# Patient Record
Sex: Male | Born: 1960 | Race: White | Hispanic: No | Marital: Married
Health system: Midwestern US, Community
[De-identification: ages and names within clinical notes are randomized; demographics above are authoritative.]

## PROBLEM LIST (undated history)

## (undated) DIAGNOSIS — E291 Testicular hypofunction: Secondary | ICD-10-CM

## (undated) DIAGNOSIS — L4050 Arthropathic psoriasis, unspecified: Secondary | ICD-10-CM

## (undated) DIAGNOSIS — E782 Mixed hyperlipidemia: Secondary | ICD-10-CM

## (undated) DIAGNOSIS — Z0000 Encounter for general adult medical examination without abnormal findings: Secondary | ICD-10-CM

## (undated) DIAGNOSIS — L409 Psoriasis, unspecified: Secondary | ICD-10-CM

## (undated) DIAGNOSIS — E7800 Pure hypercholesterolemia, unspecified: Secondary | ICD-10-CM

## (undated) DIAGNOSIS — K219 Gastro-esophageal reflux disease without esophagitis: Secondary | ICD-10-CM

## (undated) DIAGNOSIS — K5790 Diverticulosis of intestine, part unspecified, without perforation or abscess without bleeding: Secondary | ICD-10-CM

## (undated) HISTORY — PX: TOOTH EXTRACTION: SUR596

## (undated) HISTORY — PX: KNEE ARTHROSCOPY: SUR90

## (undated) HISTORY — DX: Psoriasis, unspecified: L40.9

## (undated) HISTORY — DX: Diverticulosis of intestine, part unspecified, without perforation or abscess without bleeding: K57.90

## (undated) HISTORY — DX: Pure hypercholesterolemia, unspecified: E78.00

## (undated) HISTORY — PX: OTHER SURGICAL HISTORY: SHX169

## (undated) HISTORY — DX: Gastro-esophageal reflux disease without esophagitis: K21.9

## (undated) HISTORY — PX: COLONOSCOPY: SHX174

## (undated) HISTORY — PX: TONSILLECTOMY: SUR1361

## (undated) HISTORY — DX: Arthropathic psoriasis, unspecified: L40.50

## (undated) HISTORY — PX: WRIST SURGERY: SHX841

---

## 2004-07-07 HISTORY — PX: LASIK: SHX215

## 2009-02-13 ENCOUNTER — Ambulatory Visit

## 2009-02-13 LAB — AMB POC URINALYSIS DIP STICK AUTO W/O MICRO
Bilirubin (UA POC): NEGATIVE
Blood (UA POC): NEGATIVE
Creatinine: NEGATIVE mg/dL
Glucose (UA POC): NEGATIVE
Ketones (UA POC): NEGATIVE
Leukocyte esterase (UA POC): NEGATIVE
Nitrites (UA POC): NEGATIVE
Protein (UA POC): NEGATIVE mg/dL
Protein-Creat Ratio: NEGATIVE
Specific gravity (UA POC): 1.025 (ref 1.001–1.035)
pH (UA POC): 5 (ref 4.6–8.0)

## 2009-02-13 LAB — PSA, DIAGNOSTIC (PROSTATE SPECIFIC AG): Prostate Specific Ag: 0.9 ng/mL (ref 0.0–4.0)

## 2009-02-13 LAB — CBC WITH AUTOMATED DIFF
ABS. BASOPHILS: 0 10*3/uL (ref 0.0–0.1)
ABS. EOSINOPHILS: 0.1 10*3/uL (ref 0.0–0.4)
ABS. LYMPHOCYTES: 1.9 10*3/uL (ref 0.8–3.5)
ABS. MONOCYTES: 0.5 10*3/uL (ref 0.0–1.0)
ABS. NEUTROPHILS: 2.4 10*3/uL (ref 1.8–8.0)
BASOPHILS: 0 % (ref 0–1)
EOSINOPHILS: 3 % (ref 0–7)
HCT: 46.9 % (ref 36.6–50.3)
HGB: 15.2 g/dL (ref 12.1–17.0)
LYMPHOCYTES: 38 % (ref 12–49)
MCH: 29.3 PG (ref 26.0–34.0)
MCHC: 32.4 g/dL (ref 30.0–36.5)
MCV: 90.4 FL — ABNORMAL HIGH (ref 80.0–90.0)
MONOCYTES: 10 % (ref 5–13)
NEUTROPHILS: 49 % (ref 32–75)
PLATELET: 301 10*3/uL (ref 150–400)
RBC: 5.19 M/uL (ref 4.10–5.70)
RDW: 13.9 % (ref 11.5–14.5)
WBC: 4.9 10*3/uL (ref 4.1–11.1)

## 2009-02-13 LAB — METABOLIC PANEL, COMPREHENSIVE
A-G Ratio: 1.4 (ref 1.1–2.2)
ALT (SGPT): 38 U/L (ref 12–78)
AST (SGOT): 25 U/L (ref 15–37)
Albumin: 4.2 g/dL (ref 3.5–5.0)
Alk. phosphatase: 38 U/L — ABNORMAL LOW (ref 50–136)
Anion gap: 11 mmol/L (ref 5–15)
BUN/Creatinine ratio: 13 (ref 12–20)
BUN: 13 MG/DL (ref 6–20)
Bilirubin, total: 0.6 MG/DL (ref 0.2–1.0)
CO2: 29 MMOL/L (ref 21–32)
Calcium: 9.4 MG/DL (ref 8.5–10.1)
Chloride: 102 MMOL/L (ref 97–108)
Creatinine: 1 MG/DL (ref 0.6–1.3)
GFR est AA: >60 mL/min/{1.73_m2} (ref >60)
GFR est non-AA: >60 mL/min/{1.73_m2} (ref >60)
Globulin: 2.9 g/dL (ref 2.0–4.0)
Glucose: 88 MG/DL (ref 65–100)
Potassium: 4.4 MMOL/L (ref 3.5–5.1)
Protein, total: 7.1 g/dL (ref 6.4–8.2)
Sodium: 142 MMOL/L (ref 136–145)

## 2009-02-13 LAB — AMB POC FECAL BLOOD, OCCULT, QL 3 CARDS

## 2009-02-13 LAB — LIPID PANEL
CHOL/HDL Ratio: 4.5 (ref 0–5.0)
Cholesterol, total: 232 MG/DL — ABNORMAL HIGH (ref <200)
HDL Cholesterol: 51 MG/DL
LDL, calculated: 131.4 MG/DL — ABNORMAL HIGH (ref 0–100)
Triglyceride: 248 MG/DL — ABNORMAL HIGH (ref <150)
VLDL, calculated: 49.6 MG/DL

## 2009-02-13 NOTE — Progress Notes (Signed)
Cpe with non fasting labs check cholesterol, dm    Results for orders placed in visit on 02/13/09   AMB POC URINALYSIS DIP STICK AUTO W/O MICRO   Component Value Range   ??? Color Light Yellow     ??? Clarity Clear     ??? pH 5.0  4.6 - 8.0    ??? Spec.Grav. 1.025  1.001 - 1.035    ??? Glucose Negative   > Negative    ??? Ketones Negative   > Negative    ??? Bilirubin Negative   > Negative    ??? Blood Negative   > Negative    ??? Nitrites Negative   > Negative    ??? Leukocyte esterase Negative   > Negative    ??? Protein negative   > Negative (mg/dL)   ??? Creatinine negative  (mg/dL)   ??? Protein-Creat Ratio negative         Wants to talk to you about colonoscopy    Subjective: (As above and below)   Chief Complaint   Patient presents with   ??? Complete Physical   ??? Labs   ??? Cholesterol Problem   ??? Diabetes       he is a 48 y.o. year old male who presents for evalution.    Reviewed and agree with Nurse Note duplicated in this note.  Reviewed PmHx, RxHx, FmHx, SocHx, AllgHx:  yes    Review of Systems - negative except as listed above    Objective:   Filed Vitals:    02/13/2009  9:11 AM   BP: 150/77   Pulse: 85   Height: 5' 10.87" (1.8 m)   Weight: 208 lb (94.348 kg)         Physical Examination: General appearance - alert, well appearing, and in no distress  Eyes - pupils equal and reactive, extraocular eye movements intact  Ears - bilateral TM's and external ear canals normal  Nose - normal and patent, no erythema, discharge or polyps  Mouth - mucous membranes moist, pharynx normal without lesions  Neck - supple, no significant adenopathy  Chest - clear to auscultation, no wheezes, rales or rhonchi, symmetric air entry  Heart - normal rate, regular rhythm, normal S1, S2, no murmurs, rubs, clicks or gallops  Abdomen - soft, nontender, nondistended, no masses or organomegaly  GU Male - no penile lesions or discharge, no testicular masses or tenderness, no hernias,prostate nl, heme neg   Musculoskeletal - no joint tenderness, deformity or swelling  Extremities - peripheral pulses normal, no pedal edema, no clubbing or cyanosis  Skin - normal coloration and turgor, no rashes, no suspicious skin lesions noted    Assessment/ Plan:   Jakayden was seen today for complete physical, labs, cholesterol problem and diabetes.    Diagnoses and associated orders for this visit:    Routine general medical examination at a health care facility  - AMB POC URINALYSIS DIP STICK AUTO W/O MICRO  - LIPID PANEL; Future  - CBC WITH AUTOMATED DIFF; Future  - METABOLIC PANEL, COMPREHENSIVE; Future  - PROSTATE SPECIFIC AG; Future  - REFERRAL TO GASTROENTEROLOGY  - HIV 2 AB; Future  - AMB POC FECAL BLOOD OCCULT  -DWP CA risk and scope need at age 8       I have discussed the diagnosis with the patient and the intended plan as seen in the above orders.  The patient has received an after-visit summary and questions were answered concerning future plans.  Medication Side Effects and Warnings were discussed with patient: yes  Patient Labs were reviewed: yes  Patient Past Records were reviewed:  yes    Follow-up Disposition:  Return if symptoms worsen or fail to improve.    Danley Danker, M.D.

## 2009-02-13 NOTE — Progress Notes (Signed)
Cpe with non fasting labs check cholesterol, dm    Results for orders placed in visit on 02/13/09   AMB POC URINALYSIS DIP STICK AUTO W/O MICRO   Component Value Range   ??? Color Light Yellow     ??? Clarity Clear     ??? pH 5.0  4.6 - 8.0    ??? Spec.Grav. 1.025  1.001 - 1.035    ??? Glucose Negative   > Negative    ??? Ketones Negative   > Negative    ??? Bilirubin Negative   > Negative    ??? Blood Negative   > Negative    ??? Nitrites Negative   > Negative    ??? Leukocyte esterase Negative   > Negative    ??? Protein negative   > Negative (mg/dL)   ??? Creatinine negative  (mg/dL)   ??? Protein-Creat Ratio negative           Wants to talk to you about colonoscopy

## 2009-02-15 LAB — HIV 2 AB W/REFL IB
HIV 2 ANTIBODY,HIV2: NEGATIVE
HIV-2 Ab screen: NEGATIVE

## 2009-02-15 NOTE — Progress Notes (Signed)
Quick Note:    [phone]  Call this number and no one answered will try again Monday  ______

## 2009-02-15 NOTE — Progress Notes (Signed)
Quick Note:    All labs good  ______

## 2009-02-19 NOTE — Progress Notes (Signed)
Called patient again and no answer no voice mail

## 2009-02-28 NOTE — Progress Notes (Signed)
Quick Note:    [phone] (home)   Called patient again and no answer no answering machine  ______

## 2009-11-20 MED ORDER — LORATADINE 10 MG TAB
10 mg | ORAL_TABLET | Freq: Every day | ORAL | Status: AC
Start: 2009-11-20 — End: 2010-11-15

## 2009-11-20 MED ORDER — LORATADINE 10 MG TAB
10 mg | ORAL_TABLET | Freq: Every day | ORAL | Status: DC
Start: 2009-11-20 — End: 2009-11-20

## 2009-11-20 NOTE — Progress Notes (Signed)
C/o ringing in his both ears, dizzy at times for 2 months off and on. No OTC medication    Pain Assessment Encounter      TREY GULBRANSON  11/20/2009  Onset of Symptoms: right shoulder off and on for 2 months  ________________________________________________________________________  Description:achy    Frequency: 2-3 times a day  Pain Scale:(1-10): 1 right now but moving around 3-4  Trauma Hx: fall not really sure  Hx of similar symptoms: Yes  Radiation: no  Duration:  continuous      Progression: much better  What makes it better?:   What makes it worse?:moving  Medications tried: tylenol  ________________________________________________________________________  ________________________________________________________________________

## 2009-11-20 NOTE — Progress Notes (Addendum)
Addended by: Quintin Alto on: 11/20/2009      Modules accepted: Orders

## 2009-11-20 NOTE — Progress Notes (Signed)
C/o ringing in his both ears, dizzy at times for 2 months off and on. No OTC medication    Pain Assessment Encounter      Albert Baker  11/20/2009  Onset of Symptoms: right shoulder off and on for 2 months  ________________________________________________________________________  Description:achy    Frequency: 2-3 times a day  Pain Scale:(1-10): 1 right now but moving around 3-4  Trauma Hx: fall not really sure  Hx of similar symptoms: Yes  Radiation: no  Duration:  continuous      Progression: much better  What makes it better?:   What makes it worse?:moving  Medications tried: tylenol  ________________________________________________________________________  ________________________________________________________________________     Has had in allgs    Chief Complaint   Patient presents with   ??? Ringing in Ear   ??? Shoulder Injury       he is a 49 y.o. year old male who presents for evalution.    Reviewed and agree with Nurse Note and duplicated in this note.  Reviewed PmHx, RxHx, FmHx, SocHx, AllgHx and updated and dated in the chart.    Review of Systems - negative except as listed above    Objective:   Filed Vitals:    11/20/09 0808   BP: 107/59   Pulse: 71   Height: 5' 10.8" (1.798 m)   Weight: 210 lb (95.255 kg)       Physical Examination: General appearance - alert, well appearing, and in no distress  Eyes - pupils equal and reactive, extraocular eye movements intact  Ears - dec LR bilat TMs  Nose - normal and patent, no erythema, discharge or polyps  Neck - supple, no significant adenopathy  Chest - clear to auscultation, no wheezes, rales or rhonchi, symmetric air entry  Heart - normal rate, regular rhythm, normal S1, S2, no murmurs, rubs, clicks or gallops  Musculoskeletal - no joint tenderness, deformity or swelling    Assessment/ Plan:   Albert Baker was seen today for ringing in ear and shoulder injury.    Diagnoses and associated orders for this visit:    Ringing in ear   - loratadine (CLARITIN) 10 mg tablet; Take 1 Tab by mouth daily for 360 days.  -ETD    Shoulder pain  -mild strain  -observe     Follow-up Disposition:  Return if symptoms worsen or fail to improve.    I have discussed the diagnosis with the patient and the intended plan as seen in the above orders.  The patient has received an after-visit summary and questions were answered concerning future plans.     Medication Side Effects and Warnings were discussed with patient: yes  Patient Labs were reviewed and or requested: no  Patient Past Records were reviewed and or requested  yes    Danley Danker, M.D.

## 2010-06-13 MED ORDER — DICLOFENAC 75 MG TAB, DELAYED RELEASE
75 mg | ORAL_TABLET | Freq: Two times a day (BID) | ORAL | Status: AC
Start: 2010-06-13 — End: 2010-06-27

## 2010-06-13 NOTE — Progress Notes (Signed)
Pt here for annual physical  Fasting labs    experiencing low back pain on the right side  Pt said "feels like a burning sensation in the joints."  Pain radiates down from to the feet at times of numbness and tingling in the foot  Pain increases with activity  Pain scale 0  Been going on the last couple months  OTC - Advil    Radiates to R leg    Chief Complaint   Patient presents with   ??? Physical   ??? Labs     fasting labs   ??? Back Pain     lower back pain       he is a 49 y.o. year old male who presents for evalution.    Reviewed and agree with Nurse Note and duplicated in this note.  Reviewed PmHx, RxHx, FmHx, SocHx, AllgHx and updated and dated in the chart.    Review of Systems - negative except as listed above    Objective:   Filed Vitals:    06/13/10 0815   BP: 124/77   Pulse: 74   Temp: 98 ??F (36.7 ??C)   TempSrc: Oral   Height: 5\' 10"  (1.778 m)   Weight: 215 lb 12.8 oz (97.886 kg)       Physical Examination: General appearance - alert, well appearing, and in no distress  Eyes - pupils equal and reactive, extraocular eye movements intact  Ears - bilateral TM's and external ear canals normal  Nose - normal and patent, no erythema, discharge or polyps  Mouth - mucous membranes moist, pharynx normal without lesions  Neck - supple, no significant adenopathy  Chest - clear to auscultation, no wheezes, rales or rhonchi, symmetric air entry  Heart - normal rate, regular rhythm, normal S1, S2, no murmurs, rubs, clicks or gallops  Abdomen - soft, nontender, nondistended, no masses or organomegaly  Rectal - declined by patient  Back exam - full range of motion, no tenderness, palpable spasm or pain on motion  Extremities - peripheral pulses normal, no pedal edema, no clubbing or cyanosis    Assessment/ Plan:   Albert Baker was seen today for physical, labs and back pain.    Diagnoses and associated orders for this visit:    Routine general medical examination at a health care facility   - etanercept (ENBREL) 50 mg/mL (0.98 mL) injection; by SubCUTAneous route every seven (7) days.  - LIPID PANEL  - PROSTATE SPECIFIC AG  - METABOLIC PANEL, COMPREHENSIVE  - CBC WITH AUTOMATED DIFF  - TSH, 3RD GENERATION  - TESTOSTERONE,FREE+WEAKLY BOUND    Lbp (low back pain)  - diclofenac EC (VOLTAREN) 75 mg EC tablet; Take 1 Tab by mouth two (2) times a day for 14 days.  -mild sciatica         -Patient is in good health  -Discussed with patient cancer risk factors and screens needed  -Patient needs a colonoscopy no  -Labs from previous visits were discussed with patient yes  -Discussed with patient diet and exercise yes  -Discussed with patient testicular self exam no  Follow-up Disposition:  Return if symptoms worsen or fail to improve.    I have discussed the diagnosis with the patient and the intended plan as seen in the above orders.  The patient has received an after-visit summary and questions were answered concerning future plans.     Medication Side Effects and Warnings were discussed with patient: yes  Patient Labs were reviewed and or  requested: yes  Patient Past Records were reviewed and or requested  yes    Danley Danker, M.D.

## 2010-06-13 NOTE — Telephone Encounter (Signed)
Still has same side effect

## 2010-06-13 NOTE — Patient Instructions (Signed)
Well Visit???Men 51 to 65: After Your Visit  Your Care Instructions  Physical exams can help you stay healthy. Your doctor has checked your overall health and may have suggested ways to take good care of yourself. He or she also may have recommended tests. At home, you can help prevent illness with healthy eating, regular exercise, and other steps.  Follow-up care is a key part of your treatment and safety. Be sure to make and go to all appointments, and call your doctor if you are having problems. It???s also a good idea to know your test results and keep a list of the medicines you take.  How can you care for yourself at home?  ?? Reach and stay at a healthy weight. This will lower your risk for many problems, such as obesity, diabetes, heart disease, and high blood pressure.   ?? Get at least 30 minutes of exercise on most days of the week. Walking is a good choice. You also may want to do other activities, such as running, swimming, cycling, or playing tennis or team sports.   ?? Do not smoke. Smoking can make health problems worse. If you need help quitting, talk to your doctor about stop-smoking programs and medicines. These can increase your chances of quitting for good.   ?? Always wear sunscreen on exposed skin. Make sure the sunscreen blocks ultraviolet rays (both UVA and UVB) and has a sun protection factor (SPF) of at least 15. Use it every day, even when it is cloudy. Some doctors may recommend a higher SPF, such as 30.   ?? See a dentist one or two times a year for checkups and to have your teeth cleaned.   ?? Wear a seat belt in the car.   ?? Limit alcohol to 2 drinks a day. Too much alcohol can cause health problems.  Follow your doctor's advice about when to have certain tests. These tests can spot problems early.   ?? Cholesterol. Your doctor will tell you how often to have this done based on your overall health and other things that can increase your risk for heart disease. Usually, an adult who has coronary artery disease or diabetes should have cholesterol testing at least once a year. If you are being treated for high cholesterol, how often you have tests depends on your cholesterol level and the type of treatment.   ?? Blood pressure. Experts suggest that healthy adults with normal blood pressure (119/79 mm Hg or below) have their blood pressure checked at least every 1 to 2 years. This can be done during a routine doctor visit. If you have slightly higher or high blood pressure, or are at risk for heart disease, your doctor will suggest more frequent tests.   ?? Prostate exam. Talk to your doctor about whether and how often you should have a blood test (called a PSA test) for prostate cancer. Experts differ on how often men should have this test. They recommend that you discuss the benefits and risks of the test with your doctor.   ?? Diabetes. Ask your doctor whether you should have tests for diabetes.   ?? Vision. Some experts recommend that you have yearly exams for glaucoma and other age-related eye problems starting at age 51.   ?? Hearing. Tell your doctor if you notice any change in your hearing. You can have tests to find out how well you hear.   ?? Colon cancer. You should begin tests for colon cancer at age   51. You may have one of several tests. Your doctor will tell you how often to have tests based on your age and risk. Risks include whether you already had a precancerous polyp removed from your colon or whether your parent, brother, sister, or child has had colon cancer.    ?? Coronary artery disease. Every 5 years, you should have your risks for heart disease assessed. This test uses factors such as your age, blood pressure, cholesterol, and whether you smoke or have diabetes to show what your risk for a heart attack is over the next 10 years.   ?? Abdominal aortic aneurysm. Ask your doctor whether you should have a test to check for an aneurysm. You may need a test if you ever smoked or if your parent, brother, sister, or child has had an aneurysm.  When should you call for help?  Watch closely for changes in your health, and be sure to contact your doctor if you have any problems or symptoms that concern you.    Where can you learn more?   Go to http://www.healthwise.net/[facility]  Enter K916 in the search box to learn more about "Well Visit???Men 51 to 65: After Your Visit."   ?? 2006-2011 [employer]. Care instructions adapted under license by [facility] (which disclaims liability or warranty for this information). This care instruction is for use with your licensed healthcare professional. If you have questions about a medical condition or this instruction, always ask your healthcare professional. [employer], Inc[employer] disclaims any warranty or liability for your use of this information.  Content Version: 9.0.56994; Last Revised: February 28, 2008

## 2010-06-13 NOTE — Telephone Encounter (Signed)
Called pt advised taking 1/4 of tablet still has the same side effect.

## 2010-06-13 NOTE — Progress Notes (Signed)
Pt here for annual physical  Fasting labs    experiencing low back pain on the right side  Pt said "feels like a burning sensation in the joints."  Pain radiates down from to the feet at times of numbness and tingling in the foot  Pain increases with activity  Pain scale 0  Been going on the last couple months  OTC - Advil

## 2010-06-13 NOTE — Telephone Encounter (Signed)
Pt called wanting to know if the Finasteride 5 mg rx that was discussed on the office visit today, he stated that maybe he could take 1/4 of the pills instead of the whole thing, please call back at [phone]

## 2010-06-14 LAB — METABOLIC PANEL, COMPREHENSIVE
A-G Ratio: 2 (ref 1.1–2.5)
ALT (SGPT): 35 IU/L (ref 0–55)
AST (SGOT): 26 IU/L (ref 0–40)
Albumin: 4.6 g/dL (ref 3.5–5.5)
Alk. phosphatase: 32 IU/L (ref 25–150)
BUN/Creatinine ratio: 13 (ref 9–20)
BUN: 13 mg/dL (ref 6–24)
Bilirubin, total: 0.4 mg/dL (ref 0.0–1.2)
CO2: 25 mmol/L (ref 20–32)
Calcium: 9.1 mg/dL (ref 8.7–10.2)
Chloride: 103 mmol/L (ref 97–108)
Creatinine: 0.97 mg/dL (ref 0.76–1.27)
GFR est AA: 104 mL/min/{1.73_m2} (ref >59)
GFR est non-AA: 90 mL/min/{1.73_m2} (ref >59)
GLOBULIN, TOTAL: 2.3 g/dL (ref 1.5–4.5)
Glucose: 102 mg/dL — ABNORMAL HIGH (ref 65–99)
Potassium: 4.6 mmol/L (ref 3.5–5.2)
Protein, total: 6.9 g/dL (ref 6.0–8.5)
Sodium: 141 mmol/L (ref 135–145)

## 2010-06-14 LAB — CBC WITH AUTOMATED DIFF
ABS. BASOPHILS: 0 10*3/uL (ref 0.0–0.2)
ABS. EOSINOPHILS: 0.1 10*3/uL (ref 0.0–0.4)
ABS. IMM. GRANS.: 0 10*3/uL (ref 0.0–0.1)
ABS. LYMPHOCYTES: 1.6 10*3/uL (ref 0.7–4.5)
ABS. MONOCYTES: 0.6 10*3/uL (ref 0.1–1.0)
ABS. NEUTROPHILS: 2.2 10*3/uL (ref 1.8–7.8)
BASOPHILS: 0 % (ref 0–3)
EOSINOPHILS: 3 % (ref 0–7)
HCT: 45.8 % (ref 36.0–50.0)
HGB: 15.1 g/dL (ref 12.5–17.0)
IMMATURE GRANULOCYTES: 0 % (ref 0–2)
LYMPHOCYTES: 35 % (ref 14–46)
MCH: 29.5 pg (ref 27.0–34.0)
MCHC: 33 g/dL (ref 32.0–36.0)
MCV: 90 fL (ref 80–98)
MONOCYTES: 12 % (ref 4–13)
NEUTROPHILS: 50 % (ref 40–74)
PLATELET: 291 10*3/uL (ref 140–415)
RBC: 5.11 x10E6/uL (ref 4.10–5.60)
RDW: 13.9 % (ref 11.7–15.0)
WBC: 4.5 10*3/uL (ref 4.0–10.5)

## 2010-06-14 LAB — LIPID PANEL
Cholesterol, total: 260 mg/dL — ABNORMAL HIGH (ref 100–199)
HDL Cholesterol: 52 mg/dL (ref >39)
LDL, calculated: 183 mg/dL — ABNORMAL HIGH (ref 0–99)
Triglyceride: 126 mg/dL (ref 0–149)
VLDL, calculated: 25 mg/dL (ref 5–40)

## 2010-06-14 LAB — TESTOSTERONE,FREE+WEAKLY BOUND
Testost, Free+Wk Bound %: 26.7 % (ref 9.0–46.0)
Testost, Free+Wk Bound: 54.7 ng/dL (ref 40.0–250.0)
Testosterone: 205 ng/dL — ABNORMAL LOW (ref 348–1197)

## 2010-06-14 LAB — PSA, DIAGNOSTIC (PROSTATE SPECIFIC AG): Prostate Specific Ag: 0.8 ng/mL (ref 0.0–4.0)

## 2010-06-14 LAB — TSH 3RD GENERATION: TSH: 1.25 u[IU]/mL (ref 0.450–4.500)

## 2010-06-19 MED ORDER — TESTOSTERONE ENANTHATE 200 MG/ML IM OIL
200 mg/mL | Freq: Once | INTRAMUSCULAR | Status: AC
Start: 2010-06-19 — End: 2010-06-19

## 2010-06-19 NOTE — Progress Notes (Signed)
Pt here for follow-up OV 12/8  Discuss labs results    Results for orders placed in visit on 06/13/10   LIPID PANEL   Component Value Range   ??? Cholesterol, total 260 (*) 100 - 199 (mg/dL)   ??? Triglyceride 126  0 - 149 (mg/dL)   ??? HDL Cholesterol 52  >39 (mg/dL)   ??? VLDL, calculated 25  5 - 40 (mg/dL)   ??? LDL, calculated 183 (*) 0 - 99 (mg/dL)   PROSTATE SPECIFIC AG (PSA)   Component Value Range   ??? Prostate Specific Ag 0.8  0.0 - 4.0 (ng/mL)   METABOLIC PANEL, COMPREHENSIVE   Component Value Range   ??? Glucose 102 (*) 65 - 99 (mg/dL)   ??? BUN 13  6 - 24 (mg/dL)   ??? Creatinine 0.97  0.76 - 1.27 (mg/dL)   ??? GFR est non-AA 90  >59 (mL/min/1.73)   ??? GFR est AA 104  >59 (mL/min/1.73)   ??? BUN/Creatinine ratio 13  9 - 20    ??? Sodium 141  135 - 145 (mmol/L)   ??? Potassium 4.6  3.5 - 5.2 (mmol/L)   ??? Chloride 103  97 - 108 (mmol/L)   ??? CO2 25  20 - 32 (mmol/L)   ??? Calcium 9.1  8.7 - 10.2 (mg/dL)   ??? Protein, total 6.9  6.0 - 8.5 (g/dL)   ??? Albumin 4.6  3.5 - 5.5 (g/dL)   ??? GLOBULIN, TOTAL 2.3  1.5 - 4.5 (g/dL)   ??? A-G Ratio 2.0  1.1 - 2.5    ??? Bilirubin, total 0.4  0.0 - 1.2 (mg/dL)   ??? Alk. phosphatase 32  25 - 150 (IU/L)   ??? AST 26  0 - 40 (IU/L)   ??? ALT 35  0 - 55 (IU/L)   CBC WITH AUTOMATED DIFF   Component Value Range   ??? WBC 4.5  4.0 - 10.5 (x10E3/uL)   ??? RBC 5.11  4.10 - 5.60 (x10E6/uL)   ??? HGB 15.1  12.5 - 17.0 (g/dL)   ??? HCT 45.8  36.0 - 50.0 (%)   ??? MCV 90  80 - 98 (fL)   ??? MCH 29.5  27.0 - 34.0 (pg)   ??? MCHC 33.0  32.0 - 36.0 (g/dL)   ??? RDW 13.9  11.7 - 15.0 (%)   ??? PLATELET 291  140 - 415 (x10E3/uL)   ??? NEUTROPHILS 50  40 - 74 (%)   ??? LYMPHOCYTES 35  14 - 46 (%)   ??? MONOCYTES 12  4 - 13 (%)   ??? EOSINOPHILS 3  0 - 7 (%)   ??? BASOPHILS 0  0 - 3 (%)   ??? Immature cells CANCELED     ??? ABSOLUTE NEUTS 2.2  1.8 - 7.8 (x10E3/uL)   ??? ABSOLUTE LYMPHS 1.6  0.7 - 4.5 (x10E3/uL)   ??? ABSOLUTE MONOS 0.6  0.1 - 1.0 (x10E3/uL)   ??? ABSOLUTE EOSINS 0.1  0.0 - 0.4 (x10E3/uL)   ??? ABSOLUTE BASOS 0.0  0.0 - 0.2 (x10E3/uL)    ??? IMM. GRANS. 0  0 - 2 (%)   ??? ABS. IMM. GRANS. 0.0  0.0 - 0.1 (x10E3/uL)   ??? NRBC CANCELED     ??? Hematology Comments: CANCELED     TSH, 3RD GENERATION   Component Value Range   ??? TSH, 3rd generation 1.250  0.450 - 4.500 (uIU/mL)   TESTOSTERONE,FREE+WEAKLY BOUND   Component Value Range   ??? Testosterone 205 (*)  348 - 1197 (ng/dL)   ??? Testost., % Free+Weakly Bound 26.7  9.0 - 46.0 (%)   ??? Testost., F+W Bound 54.7  40.0 - 250.0 (ng/dL)       Chief Complaint   Patient presents with   ??? Follow-up     OV 12/8   ??? Abnormal Lab Results       he is a 49 y.o. year old male who presents for evalution.    Reviewed and agree with Nurse Note and duplicated in this note.  Reviewed PmHx, RxHx, FmHx, SocHx, AllgHx and updated and dated in the chart.    Review of Systems - negative except as listed above    Objective:   Filed Vitals:    06/19/10 1404   BP: 146/77   Pulse: 75   Temp: 98.3 ??F (36.8 ??C)   TempSrc: Oral   Height: 5\' 10"  (1.778 m)   Weight: 217 lb 9.6 oz (98.703 kg)       Physical Examination: General appearance - alert, well appearing, and in no distress  Neck - supple, no significant adenopathy  Chest - clear to auscultation, no wheezes, rales or rhonchi, symmetric air entry  Heart - normal rate, regular rhythm, normal S1, S2, no murmurs, rubs, clicks or gallops    Assessment/ Plan:   Albert Baker was seen today for follow-up and abnormal lab results.    Diagnoses and associated orders for this visit:    Hypogonadism male  - THER/PROPH/DIAG INJECTION, SUBCUT/IM  - testosterone enanthate (DELATESTRYL) 200 mg/mL injection; 2 mL by IntraMUSCular route once for 1 dose.  - TESTOSTERONE ENANTHATE INJ  -DWp trt options and gel at the next ov         Follow-up Disposition:  Return in about 1 month (around 07/20/2010) for testosterone.    I have discussed the diagnosis with the patient and the intended plan as seen in the above orders.  The patient has received an after-visit summary and questions were answered concerning future plans.      Medication Side Effects and Warnings were discussed with patient: yes  Patient Labs were reviewed and or requested: yes  Patient Past Records were reviewed and or requested  yes    Danley Danker, M.D.

## 2010-06-19 NOTE — Progress Notes (Signed)
Pt here for follow-up OV 12/8  Discuss labs results    Results for orders placed in visit on 06/13/10   LIPID PANEL   Component Value Range   ??? Cholesterol, total 260 (*) 100 - 199 (mg/dL)   ??? Triglyceride 126  0 - 149 (mg/dL)   ??? HDL Cholesterol 52  >39 (mg/dL)   ??? VLDL, calculated 25  5 - 40 (mg/dL)   ??? LDL, calculated 183 (*) 0 - 99 (mg/dL)   PROSTATE SPECIFIC AG (PSA)   Component Value Range   ??? Prostate Specific Ag 0.8  0.0 - 4.0 (ng/mL)   METABOLIC PANEL, COMPREHENSIVE   Component Value Range   ??? Glucose 102 (*) 65 - 99 (mg/dL)   ??? BUN 13  6 - 24 (mg/dL)   ??? Creatinine 0.97  0.76 - 1.27 (mg/dL)   ??? GFR est non-AA 90  >59 (mL/min/1.73)   ??? GFR est AA 104  >59 (mL/min/1.73)   ??? BUN/Creatinine ratio 13  9 - 20    ??? Sodium 141  135 - 145 (mmol/L)   ??? Potassium 4.6  3.5 - 5.2 (mmol/L)   ??? Chloride 103  97 - 108 (mmol/L)   ??? CO2 25  20 - 32 (mmol/L)   ??? Calcium 9.1  8.7 - 10.2 (mg/dL)   ??? Protein, total 6.9  6.0 - 8.5 (g/dL)   ??? Albumin 4.6  3.5 - 5.5 (g/dL)   ??? GLOBULIN, TOTAL 2.3  1.5 - 4.5 (g/dL)   ??? A-G Ratio 2.0  1.1 - 2.5    ??? Bilirubin, total 0.4  0.0 - 1.2 (mg/dL)   ??? Alk. phosphatase 32  25 - 150 (IU/L)   ??? AST 26  0 - 40 (IU/L)   ??? ALT 35  0 - 55 (IU/L)   CBC WITH AUTOMATED DIFF   Component Value Range   ??? WBC 4.5  4.0 - 10.5 (x10E3/uL)   ??? RBC 5.11  4.10 - 5.60 (x10E6/uL)   ??? HGB 15.1  12.5 - 17.0 (g/dL)   ??? HCT 45.8  36.0 - 50.0 (%)   ??? MCV 90  80 - 98 (fL)   ??? MCH 29.5  27.0 - 34.0 (pg)   ??? MCHC 33.0  32.0 - 36.0 (g/dL)   ??? RDW 13.9  11.7 - 15.0 (%)   ??? PLATELET 291  140 - 415 (x10E3/uL)   ??? NEUTROPHILS 50  40 - 74 (%)   ??? LYMPHOCYTES 35  14 - 46 (%)   ??? MONOCYTES 12  4 - 13 (%)   ??? EOSINOPHILS 3  0 - 7 (%)   ??? BASOPHILS 0  0 - 3 (%)   ??? Immature cells CANCELED     ??? ABSOLUTE NEUTS 2.2  1.8 - 7.8 (x10E3/uL)   ??? ABSOLUTE LYMPHS 1.6  0.7 - 4.5 (x10E3/uL)   ??? ABSOLUTE MONOS 0.6  0.1 - 1.0 (x10E3/uL)   ??? ABSOLUTE EOSINS 0.1  0.0 - 0.4 (x10E3/uL)   ??? ABSOLUTE BASOS 0.0  0.0 - 0.2 (x10E3/uL)    ??? IMM. GRANS. 0  0 - 2 (%)   ??? ABS. IMM. GRANS. 0.0  0.0 - 0.1 (x10E3/uL)   ??? NRBC CANCELED     ??? Hematology Comments: CANCELED     TSH, 3RD GENERATION   Component Value Range   ??? TSH, 3rd generation 1.250  0.450 - 4.500 (uIU/mL)   TESTOSTERONE,FREE+WEAKLY BOUND   Component Value Range   ??? Testosterone 205 (*)  348 - 1197 (ng/dL)   ??? Testost., % Free+Weakly Bound 26.7  9.0 - 46.0 (%)   ??? Testost., F+W Bound 54.7  40.0 - 250.0 (ng/dL)

## 2010-07-17 MED ORDER — TESTOSTERONE 1.62 % (20.25 MG/1.25 GRAM) TRANSDERMAL GEL PUMP
20.25 mg/1.25 gram (1.62 %) | Freq: Every day | TRANSDERMAL | Status: DC
Start: 2010-07-17 — End: 2011-02-08

## 2010-07-17 MED ORDER — TESTOSTERONE ENANTHATE 200 MG/ML IM OIL
200 mg/mL | Freq: Once | INTRAMUSCULAR | Status: AC
Start: 2010-07-17 — End: 2010-07-17

## 2010-07-17 MED ORDER — BENZONATATE 100 MG CAP
100 mg | ORAL_CAPSULE | Freq: Three times a day (TID) | ORAL | Status: AC | PRN
Start: 2010-07-17 — End: 2010-07-24

## 2010-07-17 MED ORDER — AZITHROMYCIN 250 MG TAB
250 mg | ORAL_TABLET | ORAL | Status: DC
Start: 2010-07-17 — End: 2011-03-17

## 2010-07-17 NOTE — Progress Notes (Signed)
1 month follow up visit for testosterone shot    Respiratory Symptoms Encounter    Albert Baker  07/17/2010  Onset of Symptoms: URI for 2 days  ________________________________________________________________________    Description: Albert Baker presents with c/o of these respiratory symptoms:  cough.  He has had sick contacts. Other associated complaints include: nasal congestion, rhinorrhea,  cough,sinus drainage, chest congestion    Frequency: 3-4 times a day  Sputum Description: absent  Upper/Lower: upper  Duration: continuous      Progression: has worsened  What makes it better?: nothing  What makes it worse?: worse at night  Medications tried: thera flu  What is your worse symptom:cough

## 2010-07-17 NOTE — Progress Notes (Signed)
1 month follow up visit for testosterone shot    Respiratory Symptoms Encounter    ADIL TUGWELL  07/17/2010  Onset of Symptoms: URI for 2 days  ________________________________________________________________________    Description: Jesusita Oka presents with c/o of these respiratory symptoms:  cough.  He has had sick contacts. Other associated complaints include: nasal congestion, rhinorrhea,  cough,sinus drainage, chest congestion    Frequency: 3-4 times a day  Sputum Description: absent  Upper/Lower: upper  Duration: continuous      Progression: has worsened  What makes it better?: nothing  What makes it worse?: worse at night  Medications tried: thera flu  What is your worse symptom:cough     Chief Complaint   Patient presents with   ??? Immunization/Injection   ??? URI       he is a 50 y.o. year old male who presents for evalution.    Reviewed and agree with Nurse Note and duplicated in this note.  Reviewed PmHx, RxHx, FmHx, SocHx, AllgHx and updated and dated in the chart.    Review of Systems - negative except as listed above    Objective:   Filed Vitals:    07/17/10 0757   BP: 116/78   Pulse: 76   Temp: 98.4 ??F (36.9 ??C)   TempSrc: Oral   Height: 5\' 10"  (1.778 m)   Weight: 216 lb (97.977 kg)       Physical Examination: General appearance - alert, well appearing, and in no distress  Neck - supple, no significant adenopathy  Chest - clear to auscultation, no wheezes, rales or rhonchi, symmetric air entry  Heart - normal rate, regular rhythm, normal S1, S2, no murmurs, rubs, clicks or gallops  Abdomen - soft, nontender, nondistended, no masses or organomegaly    Assessment/ Plan:   Cleve was seen today for immunization/injection and uri.    Diagnoses and associated orders for this visit:    Uri (upper respiratory infection)  - - azithromycin (ZITHROMAX) 250 mg tablet; Take  by mouth. Take 2 tablets today and then 1 tablet daily for four days   - benzonatate (TESSALON PERLES) 100 mg capsule; Take 1 Cap by mouth three (3) times daily as needed for Cough for 7 days.    Hypogonadism male  - testosterone enanthate (DELATESTRYL) 200 mg/mL injection; 2 mL by IntraMUSCular route once for 1 dose.  - THER/PROPH/DIAG INJECTION, SUBCUT/IM  - TESTOSTERONE ENANTHATE INJ  -testosterone (ANDROGEL) 20.25 mg/1.25 gram (1.62 %) gel; Apply 81 mg to affected area daily.         Follow-up Disposition:  Return if symptoms worsen or fail to improve.    I have discussed the diagnosis with the patient and the intended plan as seen in the above orders.  The patient has received an after-visit summary and questions were answered concerning future plans.     Medication Side Effects and Warnings were discussed with patient: yes  Patient Labs were reviewed and or requested: yes  Patient Past Records were reviewed and or requested  yes    Danley Danker, M.D.    There are no Patient Instructions on file for this visit.

## 2010-07-23 NOTE — Telephone Encounter (Signed)
PA completed for Androgel.

## 2010-10-16 NOTE — Progress Notes (Signed)
Pt here for 3 month follow up; unsure about labs per pt reports;

## 2010-10-16 NOTE — Progress Notes (Signed)
Pt here for 3 month follow up; unsure about labs per pt reports;     Feels better with rx    Chief Complaint   Patient presents with   ??? Follow-up     he is a 50 y.o. year old male who presents for evalution.    Reviewed and agree with Nurse Note and duplicated in this note.  Reviewed PmHx, RxHx, FmHx, SocHx, AllgHx and updated and dated in the chart.    Review of Systems - negative except as listed above    Objective:     Filed Vitals:    10/16/10 0736   BP: 127/54   Pulse: 80   Height: 5\' 10"  (1.778 m)   Weight: 214 lb 6.4 oz (97.251 kg)     Physical Examination: General appearance - alert, well appearing, and in no distress  Neck - supple, no significant adenopathy  Chest - clear to auscultation, no wheezes, rales or rhonchi, symmetric air entry  Heart - normal rate, regular rhythm, normal S1, S2, no murmurs, rubs, clicks or gallops    Assessment/ Plan:   Albert Baker was seen today for follow-up.    Diagnoses and associated orders for this visit:    Hypogonadism male  - TESTOSTERONE, TOTAL, SERUM  -doing well with gel  -labs next OV       Follow-up Disposition:  Return in about 3 months (around 01/15/2011) for check testosterone.    I have discussed the diagnosis with the patient and the intended plan as seen in the above orders.  The patient has received an after-visit summary and questions were answered concerning future plans.     Medication Side Effects and Warnings were discussed with patient: yes  Patient Labs were reviewed and or requested: yes  Patient Past Records were reviewed and or requested  yes    Danley Danker, M.D.    There are no Patient Instructions on file for this visit.

## 2010-10-17 LAB — TESTOSTERONE, TOTAL: Testosterone: 305 ng/dL — ABNORMAL LOW (ref 348–1197)

## 2011-02-10 MED ORDER — ANDROGEL 20.25 MG/1.25 GRAM (1.62 %) TRANSDERMAL GEL PUMP
20.251.251.62 mg/1.25 gram (1.62 %) | TRANSDERMAL | Status: DC
Start: 2011-02-10 — End: 2011-03-17

## 2011-02-10 NOTE — Telephone Encounter (Signed)
Rto for labs

## 2011-02-10 NOTE — Telephone Encounter (Signed)
Called in rx to pharmacy via voicemail

## 2011-03-17 MED ORDER — TESTOSTERONE 1.62 % (20.25 MG/1.25 GRAM) TRANSDERMAL GEL PUMP
20.25 mg/1.25 gram (1.62 %) | Freq: Every day | TRANSDERMAL | Status: DC
Start: 2011-03-17 — End: 2011-04-14

## 2011-03-17 MED ORDER — INDOMETHACIN 50 MG CAP
50 mg | ORAL_CAPSULE | Freq: Three times a day (TID) | ORAL | Status: AC
Start: 2011-03-17 — End: 2011-06-15

## 2011-03-17 NOTE — Progress Notes (Signed)
Follow up visit from [facility] for Cellulitis   History of prob: 03/02/2011, [facility] swollen right foot, drew blood&prescribed cephalexin 250mg , returned 03/06/11,same problem. prescribed augmentin 875 mg, & bactrim 800mg . better but not resolved. R.I.C.E helps     Refill medication    Overall is getting better    Not red but ankle is painful    Chief Complaint   Patient presents with   ??? Hospital Follow Up     he is a 50 y.o. year old male who presents for evalution.    Reviewed PmHx, RxHx, FmHx, SocHx, AllgHx and updated and dated in the chart.    Review of Systems - negative except as listed above in the HPI    Objective:     Filed Vitals:    03/17/11 1113   BP: 123/77   Pulse: 93   Height: 5\' 10"  (1.778 m)   Weight: 216 lb (97.977 kg)     Physical Examination: General appearance - alert, well appearing, and in no distress  Chest - clear to auscultation, no wheezes, rales or rhonchi, symmetric air entry  Heart - normal rate, regular rhythm, normal S1, S2, no murmurs, rubs, clicks or gallops  R ankle swollen and sl warm    Assessment/ Plan:   Albert Baker was seen today for hospital follow up.    Diagnoses and associated orders for this visit:    Foot pain  - URIC ACID  - METABOLIC PANEL, COMPREHENSIVE  - CBC WITH AUTOMATED DIFF  - CRP, HIGH SENSITIVITY  - indomethacin (INDOCIN) 50 mg capsule; Take 1 Cap by mouth three (3) times daily for 90 days.  -pt has been off embrel and may be source of joint flares    Cellulitis  -complete rx    Hypogonadism male  - testosterone (ANDROGEL) 20.25 mg/1.25 gram (1.62 %) gel; Apply 20.25 mg to affected area daily.  - METABOLIC PANEL, COMPREHENSIVE  - PROSTATE SPECIFIC AG         Follow-up Disposition:  Return if symptoms worsen or fail to improve.    I have discussed the diagnosis with the patient and the intended plan as seen in the above orders.  The patient has received an after-visit summary and questions were answered concerning future plans.      Medication Side Effects and Warnings were discussed with patient: yes  Patient Labs were reviewed and or requested: yes  Patient Past Records were reviewed and or requested  Yes      Danley Danker, M.D.    There are no Patient Instructions on file for this visit.

## 2011-03-17 NOTE — Progress Notes (Signed)
Called in androgel to the pharmacy via voicemail

## 2011-03-17 NOTE — Progress Notes (Signed)
Follow up visit from [facility] for Cellulitis   History of prob: 03/02/2011, [facility] swollen right foot, drew blood&prescribed cephalexin 250mg , returned 03/06/11,same problem. prescribed augmentin 875 mg, & bactrim 800mg . better but not resolved. R.I.C.E helps     Refill medication

## 2011-03-18 LAB — METABOLIC PANEL, COMPREHENSIVE
A-G Ratio: 1.8 (ref 1.1–2.5)
ALT (SGPT): 272 IU/L — ABNORMAL HIGH (ref 0–55)
AST (SGOT): 185 IU/L — ABNORMAL HIGH (ref 0–40)
Albumin: 4.4 g/dL (ref 3.5–5.5)
Alk. phosphatase: 69 IU/L (ref 25–150)
BUN/Creatinine ratio: 17 (ref 9–20)
BUN: 17 mg/dL (ref 6–24)
Bilirubin, total: 0.3 mg/dL (ref 0.0–1.2)
CO2: 26 mmol/L (ref 20–32)
Calcium: 9.6 mg/dL (ref 8.7–10.2)
Chloride: 99 mmol/L (ref 97–108)
Creatinine: 1.01 mg/dL (ref 0.76–1.27)
GFR est non-AA: 86 mL/min/{1.73_m2} (ref >59)
GLOBULIN, TOTAL: 2.5 g/dL (ref 1.5–4.5)
Glucose: 100 mg/dL — ABNORMAL HIGH (ref 65–99)
Potassium: 4.4 mmol/L (ref 3.5–5.2)
Protein, total: 6.9 g/dL (ref 6.0–8.5)
Sodium: 140 mmol/L (ref 134–144)
eGFR If African American: 99 mL/min/{1.73_m2} (ref >59)

## 2011-03-18 LAB — CBC WITH AUTOMATED DIFF
ABS. BASOPHILS: 0 10*3/uL (ref 0.0–0.2)
ABS. EOSINOPHILS: 0.2 10*3/uL (ref 0.0–0.4)
ABS. IMM. GRANS.: 0.1 10*3/uL (ref 0.0–0.1)
ABS. MONOCYTES: 0.5 10*3/uL (ref 0.1–1.0)
ABS. NEUTROPHILS: 4.6 10*3/uL (ref 1.8–7.8)
Abs Lymphocytes: 1.4 10*3/uL (ref 0.7–4.5)
BASOPHILS: 0 % (ref 0–3)
EOSINOPHILS: 2 % (ref 0–7)
HCT: 44.3 % (ref 37.5–51.0)
HGB: 14.9 g/dL (ref 12.6–17.7)
IMMATURE GRANULOCYTES: 2 % (ref 0–2)
Lymphocytes: 20 % (ref 14–46)
MCH: 29.6 pg (ref 26.6–33.0)
MCHC: 33.6 g/dL (ref 31.5–35.7)
MCV: 88 fL (ref 79–97)
MONOCYTES: 8 % (ref 4–13)
NEUTROPHILS: 68 % (ref 40–74)
PLATELET: 415 10*3/uL (ref 140–415)
RBC: 5.04 x10E6/uL (ref 4.14–5.80)
RDW: 13.2 % (ref 12.3–15.4)
WBC: 6.8 10*3/uL (ref 4.0–10.5)

## 2011-03-18 LAB — PSA, DIAGNOSTIC (PROSTATE SPECIFIC AG): Prostate Specific Ag: 1 ng/mL (ref 0.0–4.0)

## 2011-03-18 LAB — CRP, HIGH SENSITIVITY: C-Reactive Protein, Cardiac: 14.64 mg/L — ABNORMAL HIGH (ref 0.00–3.00)

## 2011-03-18 LAB — URIC ACID: Uric acid: 7.6 mg/dL (ref 3.7–8.6)

## 2011-03-31 NOTE — Progress Notes (Signed)
Follow up visit for abnormal labs    Pt has no hx of inc lfts and has mild etoh use and had been on three diff antibx, had been taking a lot of advil.  Pt had been off the embrel as well, joints are better    Chief Complaint   Patient presents with   ??? Abnormal Lab Results     he is a 50 y.o. year old male who presents for evalution.    Reviewed PmHx, RxHx, FmHx, SocHx, AllgHx and updated and dated in the chart.    Review of Systems - negative except as listed above in the HPI    Objective:     Filed Vitals:    03/31/11 0741   BP: 132/89   Pulse: 83   Height: 5\' 10"  (1.778 m)   Weight: 218 lb (98.884 kg)     Physical Examination: General appearance - alert, well appearing, and in no distress  Neck - supple, no significant adenopathy  Chest - clear to auscultation, no wheezes, rales or rhonchi, symmetric air entry  Heart - normal rate, regular rhythm, normal S1, S2, no murmurs, rubs, clicks or gallops  Abdomen - soft, nontender, nondistended, no masses or organomegaly  Extremities - peripheral pulses normal, no pedal edema, no clubbing or cyanosis    Assessment/ Plan:   [place] was seen today for abnormal lab results.    Diagnoses and associated orders for this visit:    Lft elevation  - HEPATITIS PANEL, ACUTE  - HEPATIC FUNCTION PANEL  -no source and no sxs    Joint pain  - CRP, HIGH SENSITIVITY  -sxs better    Psoriasis  -back on embrel once weekly    Hypogonadism:  -on 2 pumps daily, ? Source of in LFTS     Follow-up Disposition:  Return if symptoms worsen or fail to improve.    I have discussed the diagnosis with the patient and the intended plan as seen in the above orders.  The patient has received an after-visit summary and questions were answered concerning future plans.     Medication Side Effects and Warnings were discussed with patient: yes  Patient Labs were reviewed and or requested: yes  Patient Past Records were reviewed and or requested  Yes      Danley Danker, M.D.     There are no Patient Instructions on file for this visit.

## 2011-03-31 NOTE — Progress Notes (Signed)
Follow up visit for abnormal labs

## 2011-04-01 LAB — HEPATITIS PANEL, ACUTE
Hep B Core Ab, IgM: NEGATIVE
Hep B surface Ag screen: NEGATIVE
Hep C Virus Ab: <0.1 s/co ratio (ref 0.0–0.9)
Hepatitis A Ab, IgM: NEGATIVE

## 2011-04-01 LAB — HEPATIC FUNCTION PANEL
ALT (SGPT): 76 IU/L — ABNORMAL HIGH (ref 0–55)
AST (SGOT): 42 IU/L — ABNORMAL HIGH (ref 0–40)
Albumin: 4.4 g/dL (ref 3.5–5.5)
Alk. phosphatase: 41 IU/L (ref 25–150)
Bilirubin, direct: 0.13 mg/dL (ref 0.00–0.40)
Bilirubin, total: 0.5 mg/dL (ref 0.0–1.2)
Protein, total: 6.7 g/dL (ref 6.0–8.5)

## 2011-04-01 LAB — CRP, HIGH SENSITIVITY: C-Reactive Protein, Cardiac: 1.07 mg/L (ref 0.00–3.00)

## 2011-04-02 LAB — TESTOSTERONE,F/WKLYBD+T LC/MS
TESTOST., F+W BOUND: 250 ng/dL (ref 40.0–250.0)
TESTOSTERONE, TOTAL, LC/MS: 696.3 ng/dL (ref 348.0–1197.0)
Testost, Free+Wk Bound %: 35.9 % (ref 9.0–46.0)

## 2011-04-14 ENCOUNTER — Encounter

## 2011-04-14 MED ORDER — TESTOSTERONE 1.62 % (20.25 MG/1.25 GRAM) TRANSDERMAL GEL PUMP
20.25 mg/1.25 gram (1.62 %) | Freq: Every day | TRANSDERMAL | Status: DC
Start: 2011-04-14 — End: 2011-06-26

## 2011-04-14 NOTE — Telephone Encounter (Signed)
Called in rx to pharmacy via voicemail

## 2011-04-14 NOTE — Telephone Encounter (Signed)
From: [facility] M   To: Quintin Alto, MD   Sent: Mon Apr 14, 2011 11:58 AM   Subject: Medication Renewal Request    Original authorizing provider: Quintin Alto, MD    Meyer Cory would like a refill of the following medications:  testosterone (ANDROGEL) 20.25 mg/1.25 gram (1.62 %) gel [Ikenna Ohms L Beckem Tomberlin, MD]    Preferred pharmacy: [facility] [address]    Comment:  The pharmacy at the [facility] said this script has expired. Can we renew this one please.  cheers Aqil Chiao

## 2011-06-26 ENCOUNTER — Encounter

## 2011-06-26 MED ORDER — TESTOSTERONE 1.62 % (20.25 MG/1.25 GRAM) TRANSDERMAL GEL PUMP
20.25 mg/1.25 gram (1.62 %) | Freq: Every day | TRANSDERMAL | Status: DC
Start: 2011-06-26 — End: 2011-07-30

## 2011-06-26 NOTE — Telephone Encounter (Signed)
From: [facility] M   To: Quintin Alto, MD   Sent: Thu Jun 26, 2011 4:47 PM   Subject: Medication Renewal Request    Original authorizing provider: Quintin Alto, MD    Meyer Cory would like a refill of the following medications:  testosterone (ANDROGEL) 20.25 mg/1.25 gram (1.62 %) gel [Fleurette Woolbright L Jazzalynn Rhudy, MD]    Preferred pharmacy: [facility] [address]    Comment:  Dr Okey Dupre Just called the [facility] down the street from your office, they say my script for Androgel is expired, can we renew it please ?  Thanx Louellen Molder

## 2011-07-02 NOTE — Telephone Encounter (Signed)
Called in rx pharmacy via voicemail

## 2011-07-30 ENCOUNTER — Encounter

## 2011-07-30 MED ORDER — TESTOSTERONE 1.62 % (20.25 MG/1.25 GRAM) TRANSDERMAL GEL PUMP
20.25 mg/1.25 gram (1.62 %) | Freq: Every day | TRANSDERMAL | Status: DC
Start: 2011-07-30 — End: 2012-06-23

## 2011-07-30 NOTE — Telephone Encounter (Signed)
Called in rx to pharmacy via voicemail

## 2011-07-30 NOTE — Telephone Encounter (Signed)
From: [facility] M   To: Quintin Alto, MD   Sent: Wed Jul 30, 2011 1:25 PM   Subject: Medication Renewal Request    Original authorizing provider: Quintin Alto, MD    Meyer Cory would like a refill of the following medications:  testosterone (ANDROGEL) 20.25 mg/1.25 gram (1.62 %) gel [Kunaal Walkins L Arlone Lenhardt, MD]    Preferred pharmacy: [facility] [address]    Comment:  This script is at the [facility] down the road from your office. I thought there were repeats on it but they say it has 'expired". Can i get more refill(s) please. Thank You Louellen Molder [phone]

## 2012-06-23 MED ORDER — TESTOSTERONE 1.62 % (20.25 MG/1.25 GRAM) TRANSDERMAL GEL PUMP
20.25 mg/1.25 gram (1.62 %) | Freq: Every day | TRANSDERMAL | Status: DC
Start: 2012-06-23 — End: 2013-01-03

## 2012-06-23 NOTE — Progress Notes (Signed)
CPE with fasting labs   Dr Romilda Garret his dermatologist wanted blood work CBC w. diff, Metabolic Profile, LFTS   Had Heart Scan 06/22/2012 everything was normal    Complete Physical Exam  Pre-Visit Questions:    1.  Do you follow a low fat diet?  yes  2.  Are you up to date on your Tdap (<10 years)?  no  3.  Have you ever had a Pneumovax vaccine?  no  4.  Do you follow an exercise program?  no  5.  Do you smoke?  no  6.  Do you consider yourself overweight?  no  7.  Do you Testicular self exam?  no  8.  Is there a family history of CAD< age 61?  no  9.  Is there a family history of Cancer?  no  10.  Do you know your Cancer risks?  no  11.  Have you had a colonoscopy?  Yes 3 years ago      Chief Complaint   Patient presents with   ??? Well Male   ??? Labs   ??? Cholesterol Problem     he is a 51 y.o. year old male who presents for evalution.      Reviewed PmHx, RxHx, FmHx, SocHx, AllgHx and updated and dated in the chart.    Review of Systems - negative except as listed above in the HPI    Objective:     Filed Vitals:    06/23/12 1001   BP: 138/85   Pulse: 75   Weight: 215 lb (97.523 kg)     Physical Examination: General appearance - alert, well appearing, and in no distress  Eyes - pupils equal and reactive, extraocular eye movements intact  Ears - bilateral TM's and external ear canals normal  Nose - normal and patent, no erythema, discharge or polyps  Mouth - mucous membranes moist, pharynx normal without lesions  Neck - supple, no significant adenopathy  Chest - clear to auscultation, no wheezes, rales or rhonchi, symmetric air entry  Heart - normal rate, regular rhythm, normal S1, S2, no murmurs, rubs, clicks or gallops  Abdomen - soft, nontender, nondistended, no masses or organomegaly  Extremities - peripheral pulses normal, no pedal edema, no clubbing or cyanosis    Assessment/ Plan:   Albert Baker was seen today for well male, labs and cholesterol problem.    Diagnoses and associated orders for this visit:    Routine  general medical examination at a health care facility  - LIPID PANEL  - HEMOGLOBIN A1C  - METABOLIC PANEL, COMPREHENSIVE  - THYROID CASCADE PROFILE  - PROSTATE SPECIFIC AG  -doing well  -good cardiac work up    Mixed hyperlipidemia  - LIPID PANEL  - METABOLIC PANEL, COMPREHENSIVE    Hypogonadism male  - PROSTATE SPECIFIC AG  - TESTOSTERONE,F/WKLYBD+T LC/MS  -on gel       -Patient is in good health  -Discussed with patient cancer risk factors and screens needed  -Patient needs a colonoscopy no  -Labs from previous visits were discussed with patient yes  -Discussed with patient diet and exercise yes  -Discussed with patient testicular self exam yes  Follow-up Disposition:  Return in about 6 months (around 12/22/2012).    I have discussed the diagnosis with the patient and the intended plan as seen in the above orders.  The patient has received an after-visit summary and questions were answered concerning future plans.     Medication Side Effects  and Warnings were discussed with patient: yes  Patient Labs were reviewed and or requested: yes  Patient Past Records were reviewed and or requested  Yes      There are no Patient Instructions on file for this visit.        Danley Danker, M.D.

## 2012-06-23 NOTE — Progress Notes (Signed)
CPE with fasting labs   Dr Romilda Garret his dermatologist wanted blood work CBC w. diff, Metabolic Profile, LFTS   Had Heart Scan 06/22/2012 everything was normal    Complete Physical Exam  Pre-Visit Questions:    1.  Do you follow a low fat diet?  yes  2.  Are you up to date on your Tdap (<10 years)?  no  3.  Have you ever had a Pneumovax vaccine?  no  4.  Do you follow an exercise program?  no  5.  Do you smoke?  no  6.  Do you consider yourself overweight?  no  7.  Do you Testicular self exam?  no  8.  Is there a family history of CAD< age 65?  no  9.  Is there a family history of Cancer?  no  10.  Do you know your Cancer risks?  no  11.  Have you had a colonoscopy?  Yes 3 years ago

## 2012-06-24 LAB — METABOLIC PANEL, COMPREHENSIVE
A-G Ratio: 2 (ref 1.1–2.5)
ALT (SGPT): 33 IU/L (ref 0–44)
AST (SGOT): 25 IU/L (ref 0–40)
Albumin: 4.9 g/dL (ref 3.5–5.5)
Alk. phosphatase: 39 IU/L (ref 39–117)
BUN/Creatinine ratio: 14 (ref 9–20)
BUN: 14 mg/dL (ref 6–24)
Bilirubin, total: 0.6 mg/dL (ref 0.0–1.2)
CO2: 26 mmol/L (ref 19–28)
Calcium: 10 mg/dL (ref 8.7–10.2)
Chloride: 99 mmol/L (ref 97–108)
Creatinine: 0.98 mg/dL (ref 0.76–1.27)
GFR est AA: 101 mL/min/{1.73_m2} (ref >59)
GFR est non-AA: 88 mL/min/{1.73_m2} (ref >59)
GLOBULIN, TOTAL: 2.5 g/dL (ref 1.5–4.5)
Glucose: 92 mg/dL (ref 65–99)
Potassium: 5 mmol/L (ref 3.5–5.2)
Protein, total: 7.4 g/dL (ref 6.0–8.5)
Sodium: 141 mmol/L (ref 134–144)

## 2012-06-24 LAB — LIPID PANEL
Cholesterol, total: 285 mg/dL — ABNORMAL HIGH (ref 100–199)
HDL Cholesterol: 55 mg/dL (ref >39)
LDL, calculated: 203 mg/dL — ABNORMAL HIGH (ref 0–99)
Triglyceride: 134 mg/dL (ref 0–149)
VLDL, calculated: 27 mg/dL (ref 5–40)

## 2012-06-24 LAB — PSA, DIAGNOSTIC (PROSTATE SPECIFIC AG): Prostate Specific Ag: 1.3 ng/mL (ref 0.0–4.0)

## 2012-06-24 LAB — HEMOGLOBIN A1C WITH EAG: Hemoglobin A1c: 5.9 % — ABNORMAL HIGH (ref 4.8–5.6)

## 2012-06-24 LAB — THYROID CASCADE PROFILE: TSH: 1.07 u[IU]/mL (ref 0.450–4.500)

## 2012-06-26 LAB — TESTOSTERONE,F/WKLYBD+T LC/MS
TESTOST., F+W BOUND: 70.3 ng/dL (ref 40.0–250.0)
TESTOSTERONE, TOTAL, LC/MS: 313.8 ng/dL — ABNORMAL LOW (ref 348.0–1197.0)
Testost, Free+Wk Bound %: 22.4 % (ref 9.0–46.0)

## 2013-01-03 ENCOUNTER — Encounter

## 2013-01-03 MED ORDER — TESTOSTERONE 1.62 % (20.25 MG/1.25 GRAM) TRANSDERMAL GEL PUMP
20.25 mg/1.25 gram (1.62 %) | Freq: Every day | TRANSDERMAL | Status: DC
Start: 2013-01-03 — End: 2013-10-26

## 2013-01-03 NOTE — Telephone Encounter (Signed)
Call in rx to the pharmacy via voicemail

## 2013-09-13 NOTE — Telephone Encounter (Signed)
Left voicemail for patient to return call to office regarding medication refill. 1st attempt.

## 2013-09-13 NOTE — Telephone Encounter (Signed)
appt

## 2013-09-14 NOTE — Telephone Encounter (Signed)
Patient id verified x 3. Patient notified as per provider about medication and verbalized understanding.

## 2013-09-14 NOTE — Telephone Encounter (Signed)
Patient id verified x 3. Patient notified as per provider and verbalized understanding.

## 2013-09-14 NOTE — Telephone Encounter (Signed)
-----   Message from Elease EtienneKaren I Harris sent at 09/13/2013  5:44 PM EDT -----  Regarding: Dr Zena Amosose/telephone  Pt (p) [phone],Pt left voiced mail message that he  was returning [place] call.Attempted to speak with pt, left voice mail message that his message was received and will be sent to his nurse

## 2013-09-21 NOTE — Progress Notes (Signed)
1. Have you been to the ER, urgent care clinic since your last visit?  Hospitalized since your last visit?No    2. Have you seen or consulted any other health care providers outside of the [facility] since your last visit?  Include any pap smears or colon screening. No    Patient id verified x 3. Patient here for cpe and humira discussion. Patient also needs med refill. Patient also wants to discuss knee pain with movement s/p scope several years ago.         Chief Complaint   Patient presents with   ??? Complete Physical   ??? Request For New Medication   ??? Medication Evaluation   ??? Medication Refill     he is a 53 y.o. year old male who presents for evalution.      Reviewed PmHx, RxHx, FmHx, SocHx, AllgHx and updated and dated in the chart.    Review of Systems - negative except as listed above in the HPI    Objective:     Filed Vitals:    09/21/13 0818   BP: 118/70   Pulse: 76   Temp: 97.8 ??F (36.6 ??C)   TempSrc: Oral   Resp: 16   Height: 5\' 10"  (1.778 m)   Weight: 212 lb (96.163 kg)   SpO2: 97%     Physical Examination: General appearance - alert, well appearing, and in no distress  Eyes - pupils equal and reactive, extraocular eye movements intact  Ears - bilateral TM's and external ear canals normal  Nose - normal and patent, no erythema, discharge or polyps  Mouth - mucous membranes moist, pharynx normal without lesions  Neck - supple, no significant adenopathy  Chest - clear to auscultation, no wheezes, rales or rhonchi, symmetric air entry  Heart - normal rate, regular rhythm, normal S1, S2, no murmurs, rubs, clicks or gallops  Abdomen - soft, nontender, nondistended, no masses or organomegaly  Extremities - peripheral pulses normal, no pedal edema, no clubbing or cyanosis  Left knee with mild crep and swelling      Assessment/ Plan:   Jesusita OkaDan was seen today for complete physical, request for new medication, medication evaluation and medication refill.    Diagnoses and associated orders for this  visit:    Routine general medical examination at a health care facility  - LIPID PANEL  - METABOLIC PANEL, COMPREHENSIVE  - CBC WITH AUTOMATED DIFF  - THYROID CASCADE PROFILE  - PROSTATE SPECIFIC AG  - HIV 1/2 AB, BY IMMUNOBLOT  - HEMOGLOBIN A1C  -doing well    Knee pain  - REFERRAL TO ORTHOPEDICS         -Patient is in good health  -Discussed with patient cancer risk factors and screens needed  -Patient needs a colonoscopy no  -Labs from previous visits were discussed with patient yes  -Discussed with patient diet and exercise=yes  -Discussed with patient testicular (male)/breast self exam (male)= yes  Follow-up Disposition:  Return in about 1 year (around 09/22/2014), or if symptoms worsen or fail to improve.    I have discussed the diagnosis with the patient and the intended plan as seen in the above orders.  The patient has received an after-visit summary and questions were answered concerning future plans.     Medication Side Effects and Warnings were discussed with patient: yes  Patient Labs were reviewed and or requested: yes  Patient Past Records were reviewed and or requested  yes  There are no Patient Instructions on file for this visit.        Cheri Rous, M.D.

## 2013-09-21 NOTE — Progress Notes (Signed)
1. Have you been to the ER, urgent care clinic since your last visit?  Hospitalized since your last visit?No    2. Have you seen or consulted any other health care providers outside of the [facility] since your last visit?  Include any pap smears or colon screening. No    Patient id verified x 3. Patient here for cpe and humira discussion. Patient also needs med refill. Patient also wants to discuss knee pain with movement s/p scope several years ago.

## 2013-09-22 LAB — CBC WITH AUTOMATED DIFF
ABS. BASOPHILS: 0 10*3/uL (ref 0.0–0.2)
ABS. EOSINOPHILS: 0.2 10*3/uL (ref 0.0–0.4)
ABS. IMM. GRANS.: 0 10*3/uL (ref 0.0–0.1)
ABS. MONOCYTES: 0.7 10*3/uL (ref 0.1–0.9)
ABS. NEUTROPHILS: 3.5 10*3/uL (ref 1.4–7.0)
Abs Lymphocytes: 1.8 10*3/uL (ref 0.7–3.1)
BASOPHILS: 0 %
EOSINOPHILS: 3 %
HCT: 46.7 % (ref 37.5–51.0)
HGB: 16.2 g/dL (ref 12.6–17.7)
IMMATURE GRANULOCYTES: 0 %
Lymphocytes: 29 %
MCH: 29.8 pg (ref 26.6–33.0)
MCHC: 34.7 g/dL (ref 31.5–35.7)
MCV: 86 fL (ref 79–97)
MONOCYTES: 11 %
NEUTROPHILS: 57 %
PLATELET: 327 10*3/uL (ref 150–379)
RBC: 5.44 x10E6/uL (ref 4.14–5.80)
RDW: 14.2 % (ref 12.3–15.4)
WBC: 6.1 10*3/uL (ref 3.4–10.8)

## 2013-09-22 LAB — LIPID PANEL
Cholesterol, total: 264 mg/dL — ABNORMAL HIGH (ref 100–199)
HDL Cholesterol: 51 mg/dL (ref >39)
LDL, calculated: 186 mg/dL — ABNORMAL HIGH (ref 0–99)
Triglyceride: 133 mg/dL (ref 0–149)
VLDL, calculated: 27 mg/dL (ref 5–40)

## 2013-09-22 LAB — METABOLIC PANEL, COMPREHENSIVE
A-G Ratio: 2.1 (ref 1.1–2.5)
ALT (SGPT): 34 IU/L (ref 0–44)
AST (SGOT): 28 IU/L (ref 0–40)
Albumin: 5 g/dL (ref 3.5–5.5)
Alk. phosphatase: 29 IU/L — ABNORMAL LOW (ref 39–117)
BUN/Creatinine ratio: 13 (ref 9–20)
BUN: 13 mg/dL (ref 6–24)
Bilirubin, total: 0.6 mg/dL (ref 0.0–1.2)
CO2: 24 mmol/L (ref 18–29)
Calcium: 10 mg/dL (ref 8.7–10.2)
Chloride: 100 mmol/L (ref 97–108)
Creatinine: 1.02 mg/dL (ref 0.76–1.27)
GFR est AA: 96 mL/min/{1.73_m2} (ref >59)
GFR est non-AA: 83 mL/min/{1.73_m2} (ref >59)
GLOBULIN, TOTAL: 2.4 g/dL (ref 1.5–4.5)
Glucose: 85 mg/dL (ref 65–99)
Potassium: 4.6 mmol/L (ref 3.5–5.2)
Protein, total: 7.4 g/dL (ref 6.0–8.5)
Sodium: 142 mmol/L (ref 134–144)

## 2013-09-22 LAB — HIV 1/2 AB, BY IMMUNOBLOT
HIV 1/O/2 Abs, QL: NONREACTIVE
HIV 1/O/2 Abs, Qual: NONREACTIVE
HIV 1/O/2 Abs-Index Value: <1 (ref <1.00)
HIV 1/O/2 Abs: <1 (ref <1.00)

## 2013-09-22 LAB — PSA, DIAGNOSTIC (PROSTATE SPECIFIC AG): Prostate Specific Ag: 1.3 ng/mL (ref 0.0–4.0)

## 2013-09-22 LAB — CVD REPORT

## 2013-09-22 LAB — HEMOGLOBIN A1C WITH EAG: Hemoglobin A1c: 5.8 % — ABNORMAL HIGH (ref 4.8–5.6)

## 2013-09-22 LAB — THYROID CASCADE PROFILE: TSH: 1.5 u[IU]/mL (ref 0.450–4.500)

## 2013-09-28 NOTE — Progress Notes (Signed)
[employer] screening form completed and faxed. Fax confirmed. Copy placed in scan folder to be scanned.

## 2013-10-27 MED ORDER — ANDROGEL 20.25 MG/1.25 GRAM (1.62 %) TRANSDERMAL GEL PUMP
20.251.251.62 mg/1.25 gram (1.62 %) | TRANSDERMAL | Status: DC
Start: 2013-10-27 — End: 2014-06-04

## 2013-10-27 NOTE — Telephone Encounter (Signed)
rx call in

## 2014-01-16 MED ORDER — COLCHICINE 0.6 MG TAB
0.6 mg | ORAL_TABLET | Freq: Two times a day (BID) | ORAL | Status: DC
Start: 2014-01-16 — End: 2014-07-13

## 2014-01-16 NOTE — Progress Notes (Signed)
Patient here for left foot pain. It has started about 6 months ago and is on and off. He states he has been tested negatively x 3 for gout, but states it feels like a gout flare up. Using more advil, not effective.  1. Have you been to the ER, urgent care clinic since your last visit?  Hospitalized since your last visit?No    2. Have you seen or consulted any other health care providers outside of the [facility] since your last visit?  Include any pap smears or colon screening. Dr. Allena Katz, RA, Dr. Ricardo Jericho, derm

## 2014-01-19 MED ORDER — PREDNISONE 10 MG TABLETS IN A DOSE PACK
10 mg | ORAL_TABLET | ORAL | Status: DC
Start: 2014-01-19 — End: 2014-02-07

## 2014-01-19 MED ORDER — TRAMADOL 50 MG TAB
50 mg | ORAL_TABLET | Freq: Three times a day (TID) | ORAL | Status: DC | PRN
Start: 2014-01-19 — End: 2016-03-24

## 2014-01-19 NOTE — Progress Notes (Signed)
HISTORY OF PRESENT ILLNESS  Albert Baker is a 53 y.o. male.  HPI  Seen Monday and has gotten no relief with the colchicine  Severe pain bil feet and knees  Kept feet elevated and has not helped any, even getting some swelling  advil without relief  Has known psoratic arthritis    ROS  A comprehensive review of system was obtained and negative except findings in the HPI    BP 138/68 mmHg   Pulse 78   Temp(Src) 98.2 ??F (36.8 ??C) (Oral)   Resp 18   Ht 5\' 10"  (1.778 m)   Wt 209 lb 9.6 oz (95.074 kg)   BMI 30.07 kg/m2   SpO2 100%  Physical Exam   Constitutional: He is oriented to person, place, and time.   Musculoskeletal: He exhibits edema and tenderness.   Pain on palp of the feet in general, no redness or heat   Neurological: He is alert and oriented to person, place, and time.   Nursing note and vitals reviewed.      ASSESSMENT and PLAN  Encounter Diagnoses   Name Primary?   ??? Psoriatic arthritis (HCC) Yes     Orders Placed This Encounter   ??? predniSONE (STERAPRED DS) 10 mg dose pack   ??? traMADol (ULTRAM) 50 mg tablet     Most likely in flair of symptoms  Given tramadol to use sparingly  pred pack given  Keep appt with Rheum next week for next injection  Loleta RoseMelissa N. Milessa Hogan, MSN, ANP  01/19/2014

## 2014-01-19 NOTE — Progress Notes (Signed)
1. Have you been to the ER, urgent care clinic since your last visit?  Hospitalized since your last visit?No    2. Have you seen or consulted any other health care providers outside of the [facility] since your last visit?  Include any pap smears or colon screening. No    Patient id verified x 3. Patient here for foot pain worsening over the last 6 months. Patient needs pain control.

## 2014-01-19 NOTE — Progress Notes (Signed)
HISTORY OF PRESENT ILLNESS  Albert Baker is a 53 y.o. male.  HPI  Patient here for left foot pain. It has started about 6 months ago and is on and off. He states he has been tested negatively x 3 for gout, but states it feels like a gout flare up. Using more advil, not effective.  Has had colcrys in the past that worked well for the pain. No injury to explain sudden onset pain  Also followed for psoriasis by Rheum    ROS  A comprehensive review of system was obtained and negative except findings in the HPI    BP 132/74 mmHg   Pulse 78   Temp(Src) 98 ??F (36.7 ??C)   Resp 20   Ht 5\' 10"  (1.778 m)   Wt 209 lb 9.6 oz (95.074 kg)   BMI 30.07 kg/m2   SpO2 98%  Physical Exam   Constitutional: He is oriented to person, place, and time. He appears well-developed and well-nourished. No distress.   Cardiovascular: Normal rate and regular rhythm.    No murmur heard.  Pulmonary/Chest: Breath sounds normal. No respiratory distress. He has no wheezes.   Musculoskeletal: He exhibits no edema.   Neurological: He is alert and oriented to person, place, and time.   Skin:   Left foot with swelling, no redness or heat, very tender on palp of the top of the foot   Nursing note and vitals reviewed.      ASSESSMENT and PLAN  Encounter Diagnoses   Name Primary?   ??? Psoriatic arthritis (HCC) Yes   ??? Gout      Orders Placed This Encounter   ??? Ustekinumab (STELARA) 45 mg/0.5 mL injection   ??? colchicine 0.6 mg tablet     Given colchicine to take bid until flair calms down  Reviewed triggers and adr/se of med  Follow up prn  Loleta RoseMelissa N. Rickesha Veracruz, MSN, ANP  01/19/2014

## 2014-02-07 MED ORDER — PREDNISONE 10 MG TABLETS IN A DOSE PACK
10 mg | ORAL_TABLET | ORAL | Status: DC
Start: 2014-02-07 — End: 2014-03-06

## 2014-02-07 MED ORDER — PREDNISONE 10 MG TABLETS IN A DOSE PACK
10 mg | PACK | ORAL | Status: DC
Start: 2014-02-07 — End: 2014-03-06

## 2014-02-07 MED ORDER — METHYLPREDNISOLONE SODIUM SUCC 125 MG/2 ML IJ SOLR
125 mg/2 mL | Freq: Once | INTRAMUSCULAR | Status: AC
Start: 2014-02-07 — End: 2014-02-07

## 2014-02-07 NOTE — Progress Notes (Signed)
1. Have you been to the ER, urgent care clinic since your last visit?  Hospitalized since your last visit?No    2. Have you seen or consulted any other health care providers outside of the [facility] since your last visit?  Include any pap smears or colon screening. No    Patient id verified x 3. Patient here for swelling in bilateral feet for 6 months. Patient reporting increased pain. Patient was give tramadol during last visit. Patient also reporting he self medicated with diclofenac. Patient reporting the tramadol helped a little. Patient reporting feet have bruise like symptoms on them and now right wrist is "locked up".

## 2014-02-07 NOTE — Progress Notes (Signed)
1. Have you been to the ER, urgent care clinic since your last visit?  Hospitalized since your last visit?No    2. Have you seen or consulted any other health care providers outside of the [facility] since your last visit?  Include any pap smears or colon screening. No    Patient id verified x 3. Patient here for swelling in bilateral feet for 6 months. Patient reporting increased pain. Patient was give tramadol during last visit. Patient also reporting he self medicated with diclofenac. Patient reporting the tramadol helped a little. Patient reporting feet have bruise like symptoms on them and now right wrist is "locked up".     Chief Complaint   Patient presents with   ??? Wrist Pain   ??? Swelling   ??? Foot Swelling     he is a 53 y.o. year old male who presents for evalution.    Reviewed PmHx, RxHx, FmHx, SocHx, AllgHx and updated and dated in the chart.    Patient Active Problem List    Diagnosis   ??? Hypogonadism male   ??? Psoriasis   ??? Mixed hyperlipidemia       Nurse notes were reviewed and copied and are correct  Review of Systems - negative except as listed above in the HPI    Objective:     Filed Vitals:    02/07/14 1600   BP: 136/87   Pulse: 90   Temp: 98.7 ??F (37.1 ??C)   TempSrc: Oral   Resp: 16   Height: 5\' 10"  (1.778 m)   Weight: 210 lb (95.255 kg)   SpO2: 100%     Physical Examination: General appearance - alert, well appearing, and in no distress  R wrist red, hot and swollen and tender    Assessment/ Plan:   Keisean was seen today for wrist pain, swelling and foot swelling.    Diagnoses and associated orders for this visit:    Joint pain  - URIC A+ANA+RA QN+CRP+ASO  - predniSONE (STERAPRED DS) 10 mg dose pack; 12 day DS taper pack as directed  -refer to rheum  -gout vs psoriatic OA       Follow-up Disposition:  Return if symptoms worsen or fail to improve.    I have discussed the diagnosis with the patient and the intended plan as  seen in the above orders.  The patient has received an after-visit summary and questions were answered concerning future plans.     Medication Side Effects and Warnings were discussed with patient: yes  Patient Labs were reviewed and or requested: yes  Patient Past Records were reviewed and or requested: yes    Danley Danker, M.D.    There are no Patient Instructions on file for this visit.

## 2014-02-09 LAB — URIC A+ANA+RA QN+CRP+ASO
Anti-streptolysin O Ab: 25.8 IU/mL (ref 0.0–200.0)
Antinuclear Antibodies Direct: NEGATIVE
C-Reactive Protein, Qt: 59.1 mg/L — ABNORMAL HIGH (ref 0.0–4.9)
Rheumatoid factor: 11.6 IU/mL (ref 0.0–13.9)
Uric acid: 7.7 mg/dL (ref 3.7–8.6)

## 2014-03-06 MED ORDER — HYDROCODONE-ACETAMINOPHEN 5 MG-325 MG TAB
5-325 mg | ORAL_TABLET | Freq: Four times a day (QID) | ORAL | Status: DC | PRN
Start: 2014-03-06 — End: 2016-03-24

## 2014-03-06 MED ORDER — PREDNISONE 10 MG TABLETS IN A DOSE PACK
10 mg | PACK | ORAL | Status: DC
Start: 2014-03-06 — End: 2014-05-02

## 2014-03-06 NOTE — Progress Notes (Signed)
Patient here for right hand pain and swollen x 2 weeks. It started with left pinky finger 3 weeks ago. Was seen by Dr. Okey Dupre 02/11/2014 for same. Patient states he has used neighbors Arts administrator" on hand without feeling it harmed him.   Taking ibuprofen constantly.     1. Have you been to the ER, urgent care clinic since your last visit?  Hospitalized since your last visit?No    2. Have you seen or consulted any other health care providers outside of the [facility] since your last visit?  Include any pap smears or colon screening. No       Chief Complaint   Patient presents with   ??? Hand Pain     right hand swelling and pain     he is a 53 y.o. year old male who presents for evalution.    Reviewed PmHx, RxHx, FmHx, SocHx, AllgHx and updated and dated in the chart.    Patient Active Problem List    Diagnosis   ??? Hypogonadism male   ??? Psoriasis   ??? Mixed hyperlipidemia       Nurse notes were reviewed and copied and are correct  Review of Systems - negative except as listed above in the HPI    Objective:     Filed Vitals:    03/06/14 1103   BP: 141/87   Pulse: 88   Temp: 98.2 ??F (36.8 ??C)   Resp: 20   Height:  (1.778 m)   Weight: 213 lb (96.616 kg)   SpO2: 98%     Physical Examination: General appearance - alert, well appearing, and in no distress  R hand red, hot and swollen    Assessment/ Plan:   Nesanel was seen today for hand pain.    Diagnoses and associated orders for this visit:    Joint pain  - predniSONE (STERAPRED DS) 10 mg dose pack; 12 day DS taper pack as directed  -add norco  -refer to rheum  -suspect psoriatic Arth       Follow-up Disposition:  Return if symptoms worsen or fail to improve.    I have discussed the diagnosis with the patient and the intended plan as seen in the above orders.  The patient has received an after-visit summary and questions were answered concerning future plans.     Medication Side Effects and Warnings were discussed with patient: yes   Patient Labs were reviewed and or requested: yes  Patient Past Records were reviewed and or requested: yes    Danley Danker, M.D.    There are no Patient Instructions on file for this visit.

## 2014-03-06 NOTE — Progress Notes (Signed)
Patient here for right hand pain and swollen x 2 weeks. It started with left pinky finger 3 weeks ago. Was seen by Dr. Okey Dupre 02/11/2014 for same. Patient states he has used neighbors Arts administrator" on hand without feeling it harmed him.   Taking ibuprofen constantly.     1. Have you been to the ER, urgent care clinic since your last visit?  Hospitalized since your last visit?No    2. Have you seen or consulted any other health care providers outside of the [facility] since your last visit?  Include any pap smears or colon screening. No

## 2014-04-11 ENCOUNTER — Encounter
Admit: 2014-04-12 | Discharge: 2014-06-06 | Payer: PRIVATE HEALTH INSURANCE | Attending: Family Medicine | Primary: Family Medicine

## 2014-04-11 ENCOUNTER — Encounter: Admit: 2014-04-12 | Payer: PRIVATE HEALTH INSURANCE | Primary: Family Medicine

## 2014-04-11 ENCOUNTER — Ambulatory Visit: Admit: 2014-04-11 | Payer: PRIVATE HEALTH INSURANCE | Attending: Rheumatology | Primary: Family Medicine

## 2014-04-11 ENCOUNTER — Inpatient Hospital Stay: Admit: 2014-04-11 | Primary: Family Medicine

## 2014-04-11 ENCOUNTER — Encounter: Attending: Rheumatology | Primary: Family Medicine

## 2014-04-11 DIAGNOSIS — M109 Gout, unspecified: Secondary | ICD-10-CM

## 2014-04-11 MED ORDER — ALLOPURINOL 300 MG TAB
300 mg | ORAL_TABLET | Freq: Every day | ORAL | Status: DC
Start: 2014-04-11 — End: 2014-07-13

## 2014-04-11 MED ORDER — PREDNISONE 5 MG TAB
5 mg | ORAL_TABLET | Freq: Every day | ORAL | Status: DC
Start: 2014-04-11 — End: 2014-05-02

## 2014-04-11 NOTE — Progress Notes (Signed)
CHIEF COMPLAINT  The patient was sent for rheumatology consultation by Dr. Einar Grad ROSE, MD for evaluation of joint pain.    HISTORY OF PRESENT ILLNESS  This is a 53 y.o. Caucasian male.  Today, the patient complains of pain in the joints.  Location: right hand, right wrist, left ankle  Severity:  2 on a scale of 0-10  Timing: all day   Duration:  many years  Modifying factors:   Context/Associated signs and symptoms: The patient states he was diagnosed with psoriasis 20-30 years ago. He was on Enbrel 2-3x/week and weaned to 1x/week, switched to humira (6 months), and in January he switched to Stelara. Currently he has dactylitis in his right hand and a psoriatic patch on his right lower leg. In the past he has had joint stiffness/pain symptoms in bilateral feet, hands, and wrists. He has a history of enthesitis and plantar fasciitis as well. He states this has resolved on its own in the past. He states he has had psoriasis on his scalp and in his ears in the past, as well as his knees, elbows, and nailfold beds. He denies dry eyes/mouth, oral ulcers, hair loss. He admits to morning stiffness and gelling. He has had arthroscopies on bilateral knees in the past. In August his uric acid was 7.7, his CRP was 59. In 2012 his uric acid was 7.6, but he states he has never had his uric acid tested when he was not flaring. He has been on colchicine 0.6 mg daily in the past    [place] as per HPI  Negatives as follows:  CONSTITUTlONAL:  Denies unexplained persistent fevers, weight change, chronic fatigue  HEAD/EYES:   Denies eye redness, blurry vision or sudden loss of vision, dry eyes, HA, temporal artery pain  ENT:    Denies oral/nasal ulcers, recurrent sinus infections, dry mouth  RESPIRATORY:  No pleuritic pain, history of pleural effusions, hemoptysis, exertional dyspnea  CARDIOVASCULAR:  Denies chest pain, history of pericardial effusions   GASTRO:   Denies heartburn, esophageal dysmotility, abdominal pain, nausea, vomiting, diarrhea, blood in the stool  HEMATOLOGIC:  No easy bruising, purpura, swollen lymph nodes  SKIN:    Denies alopecia, ulcers, nodules, sun sensitivity, unexplained persistent rash   VASCULAR:   Denies edema, cyanosis, raynaud phenomenon  NEUROLOGIC:  Denies specific muscle weakness, paresthesias   PSYCHIATRIC:  No sleep disturbance / snoring, depression, anxiety  MSK:    No morning stiffness >1 hour, SI joint pain    MEDICAL AND SOCIAL HISTORY  This was reviewed with the patient and reviewed in the medical records.      Past Medical History   Diagnosis Date   ??? Joint pain    ??? Mixed hyperlipidemia 06/17/2010   ??? Psoriasis 06/19/2010     Past Surgical History   Procedure Laterality Date   ??? Hx tonsillectomy     ??? Hx orthopaedic       History   Substance Use Topics   ??? Smoking status: Never Smoker    ??? Smokeless tobacco: Never Used   ??? Alcohol Use: 1.0 oz/week     2 Cans of beer per week     Employment - photoengraver  Sleep - had sleep study with light snoring  Exercise - no    FAMILY HISTORY  psoriasis - sister, daughter, mother  Rheumatoid arthritis - sister, grandmother    MEDICATIONS  All the current medications were reviewed in detail.  PHYSICAL EXAM  Blood pressure 156/82, pulse 91, temperature 97.7 ??F (36.5 ??C), temperature source Oral, height _0  (1.778 m), weight 215 lb 12.8 oz (97.886 kg), SpO2 97 %.  GENERAL APPEARANCE: Well-nourished adult in no acute distress.  EYES: No scleral erythema, conjunctival injection.  ENT: No oral ulcer, parotid enlargement.  NECK: No adenopathy, thyroid enlargement.  CARDIOVASCULAR: Heart rhythm is regular. No murmur, rub, gallop.  CHEST: Normal vesicular breath sounds. No wheezes, rales, pleural friction rubs.  ABDOMINAL: The abdomen is soft and nontender. Liver and spleen are nonpalpable. Bowel sounds are normal.  EXTREMITIES: There is no evidence of clubbing, cyanosis, edema.   SKIN: No rash, palpable purpura, digital ulcer, abnormal thickening. Psoriatic rash on right shin, left knee  NEUROLOGICAL: Normal gait and station, full strength in upper and lower extremities, normal sensation to light touch.  MUSCULOSKELETAL: Negative Patrick's bilaterally  Upper extremities - Chronic synovial thickening of right wrist. Dactylitis of right 5th finger. Bilateral 5th finger bony prominence noted  Lower extremities - Left ankle swollen, tender, enthesitis, decreased ROM, synovial thickening.     LABS, RADIOLOGY AND PROCEDURES  Previous labs reviewed -Yes  Previous radiology reviewed -Yes  Previous procedures reviewed -Yes  Previous medical records reviewed/summarized -Yes    ASSESSMENT  1. Psoriasis, episodic inflammatory arthritis, enthesitis, plantar fasciitis, dactylitis, elevated uric acid. The patient most likely has psoriatic and gouty arthritis. His current psoriasis is not controlled with Stelara so I have recommended we double the dose since he does weigh over 100kg. I would like to start him on a gout medication and discuss that his episodic arthritis is most likely related to gout and not psoriatic arthritis. However the enthesitis, plantar fasciitis and dactylitis is related to PsA. If the Stelara does not help we can add a disease modifying agent such as sulfasalazine. We will check xrays to look for erosions and start him on prednisone 5 mg daily to help with gout inflammation.     PLAN  1. Check uric acid  2. Check CBC, CMP, markers of inflammation (ESR, CRP), ANA IF, HLA B27,   3. Start prednisone 5 mg daily  4. Start allopurinol 300 mg daily  5. Increase Stelara to 90 mg dosage  6. Rule out hepatitis and tuberculosis in anticipation of possible anti-TNF therapy  7. X-rays of hands, feet to look for inflammatory/erosive changes     Laken Rog M. Posey Pronto, MD, [place]   Adult and Pediatric Rheumatology     [place]   [address] [place] [address], Phone [phone], Fax [phone]     Visiting Assistant Professor of Pediatrics    Department of Pediatrics, [facility] [place], [place] [address], Phone [phone], Fax [phone]    cc:  Truddie Crumble, MD    Written by Henrietta Hoover, scribe, as dictated by Mikal Plane. Posey Pronto, M.D.

## 2014-04-14 LAB — QUANTIFERON IN TUBE REFL
QFT TB Ag minus Nil Value: 0 IU/mL
QuantiFERON Mitogen Value: 8.19 IU/mL
QuantiFERON Nil Value: 0.01 IU/mL
QuantiFERON TB Ag Value: 0.01 IU/mL
QuantiFERON TB Gold: NEGATIVE

## 2014-04-14 LAB — CBC+PLATELET+HEM REVIEW
ABS. BASOPHILS: 0 10*3/uL (ref 0.0–0.2)
ABS. EOSINOPHILS: 0.1 10*3/uL (ref 0.0–0.4)
ABS. LYMPHOCYTES: 1.8 10*3/uL (ref 0.7–3.1)
ABS. MONOCYTES: 0.6 10*3/uL (ref 0.1–0.9)
ABS. NEUTROPHILS: 4.1 10*3/uL (ref 1.4–7.0)
BASOPHILS: 0 %
EOSINOPHILS: 2 %
HCT: 43.3 % (ref 37.5–51.0)
HGB: 14.4 g/dL (ref 12.6–17.7)
LYMPHOCYTES: 27 %
MCH: 28.2 pg (ref 26.6–33.0)
MCHC: 33.3 g/dL (ref 31.5–35.7)
MCV: 85 fL (ref 79–97)
MONOCYTES: 9 %
NEUTROPHILS: 62 %
PLATELET COMMENT: INCREASED
PLATELET: 392 10*3/uL — ABNORMAL HIGH (ref 150–379)
RBC: 5.11 x10E6/uL (ref 4.14–5.80)
RDW: 15 % (ref 12.3–15.4)
WBC: 6.6 10*3/uL (ref 3.4–10.8)

## 2014-04-14 LAB — METABOLIC PANEL, COMPREHENSIVE
A-G Ratio: 1.9 (ref 1.1–2.5)
ALT (SGPT): 22 IU/L (ref 0–44)
AST (SGOT): 20 IU/L (ref 0–40)
Albumin: 4.7 g/dL (ref 3.5–5.5)
Alk. phosphatase: 39 IU/L (ref 39–117)
BUN/Creatinine ratio: 13 (ref 9–20)
BUN: 12 mg/dL (ref 6–24)
Bilirubin, total: 0.3 mg/dL (ref 0.0–1.2)
CO2: 24 mmol/L (ref 18–29)
Calcium: 9.7 mg/dL (ref 8.7–10.2)
Chloride: 100 mmol/L (ref 97–108)
Creatinine: 0.92 mg/dL (ref 0.76–1.27)
GFR est AA: 109 mL/min/{1.73_m2} (ref >59)
GFR est non-AA: 94 mL/min/{1.73_m2} (ref >59)
GLOBULIN, TOTAL: 2.5 g/dL (ref 1.5–4.5)
Glucose: 93 mg/dL (ref 65–99)
Potassium: 4.7 mmol/L (ref 3.5–5.2)
Protein, total: 7.2 g/dL (ref 6.0–8.5)
Sodium: 145 mmol/L — ABNORMAL HIGH (ref 134–144)

## 2014-04-14 LAB — SED RATE (ESR): Sed rate (ESR): 27 mm/hr (ref 0–30)

## 2014-04-14 LAB — HCV AB W/RFLX TO NAA
HCV AB, 144035: <0.1 s/co ratio (ref 0.0–0.9)
HCV Ab: <0.1 s/co ratio (ref 0.0–0.9)

## 2014-04-14 LAB — HLA-B27
HLA-B27: NEGATIVE
HLA-B27: NEGATIVE

## 2014-04-14 LAB — ANTINUCLEAR ANTIBODIES, IFA

## 2014-04-14 LAB — HEP B SURFACE AG: Hep B surface Ag screen: NEGATIVE

## 2014-04-14 LAB — URIC ACID: Uric acid: 8.7 mg/dL — ABNORMAL HIGH (ref 3.7–8.6)

## 2014-04-14 LAB — C REACTIVE PROTEIN, QT: C-Reactive Protein, Qt: 50.4 mg/L — ABNORMAL HIGH (ref 0.0–4.9)

## 2014-04-14 LAB — ANTINUCLEAR AB PATTERNS: Homogeneous pattern: 1:80 {titer}

## 2014-04-14 LAB — INTERPRETATION

## 2014-04-14 LAB — QUANTIFERON TB GOLD

## 2014-04-17 NOTE — Progress Notes (Signed)
Received notification from [employer] prior authorization for Rx Marcy PanningStelara is approved 02/28/14 to 03/29/17

## 2014-05-02 ENCOUNTER — Encounter
Admit: 2014-05-02 | Discharge: 2014-05-03 | Payer: PRIVATE HEALTH INSURANCE | Attending: Family Medicine | Primary: Family Medicine

## 2014-05-02 ENCOUNTER — Ambulatory Visit: Admit: 2014-05-02 | Payer: PRIVATE HEALTH INSURANCE | Attending: Rheumatology | Primary: Family Medicine

## 2014-05-02 DIAGNOSIS — M109 Gout, unspecified: Secondary | ICD-10-CM

## 2014-05-02 MED ORDER — PREDNISONE 5 MG TAB
5 mg | ORAL_TABLET | ORAL | Status: DC
Start: 2014-05-02 — End: 2014-09-11

## 2014-05-02 NOTE — Patient Instructions (Signed)
Prednisone 2.5mg  daily (half a pill) x 2 weeks and then stop    Colchicine 0.6mg  daily    Allopurinol 300mg  (1 pill) - will most likely increase dose - (100mg  pill)    Stelara - call us with dose
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
## 2014-05-03 LAB — URIC ACID: Uric acid: 5.9 mg/dL (ref 3.7–8.6)

## 2014-05-03 LAB — C REACTIVE PROTEIN, QT: C-Reactive Protein, Qt: 7.1 mg/L — ABNORMAL HIGH (ref 0.0–4.9)

## 2014-05-03 LAB — SED RATE (ESR): Sed rate (ESR): 9 mm/hr (ref 0–30)

## 2014-05-03 MED ORDER — ALLOPURINOL 100 MG TAB
100 mg | ORAL_TABLET | Freq: Every day | ORAL | Status: AC
Start: 2014-05-03 — End: 2014-06-02

## 2014-06-05 MED ORDER — ANDROGEL 20.25 MG/1.25 GRAM PER PUMP ACT. (1.62 %) TRANSDERMAL GEL
20.25 mg/1.25 gram (1.62 %) | TRANSDERMAL | Status: DC
Start: 2014-06-05 — End: 2015-01-15

## 2014-06-05 NOTE — Telephone Encounter (Signed)
Rx phoned to pharmacy as ordered.

## 2014-06-14 ENCOUNTER — Ambulatory Visit: Admit: 2014-06-14 | Payer: PRIVATE HEALTH INSURANCE | Attending: Rheumatology | Primary: Family Medicine

## 2014-06-14 DIAGNOSIS — L4050 Arthropathic psoriasis, unspecified: Secondary | ICD-10-CM

## 2014-06-14 MED ORDER — PREDNISONE 5 MG TAB
5 mg | ORAL_TABLET | Freq: Every day | ORAL | Status: AC
Start: 2014-06-14 — End: 2014-07-14

## 2014-06-14 MED ORDER — COLCHICINE 0.6 MG TAB
0.6 mg | ORAL_TABLET | Freq: Every day | ORAL | Status: DC
Start: 2014-06-14 — End: 2014-07-13

## 2014-06-14 NOTE — Progress Notes (Signed)
RHEUMATOLOGY PROBLEM LIST AND CHIEF COMPLAINT  1. Psoriatic Arthritis - Psoriasis, episodic inflammatory arthritis, enthesitis, plantar fasciitis, dactylitis  In past: Enbrel, Humira  Stelara (current)  2. Gout - history of flares, elevated uric acid  most recent uric acid: 5.9 (05/02/2014)  Allopurinol (03/2014-current)    INTERVAL HISTORY  This is a 53 y.o. Caucasian male.  Today, the patient complains of pain in the joints.    Severity:  2 on a scale of 0-10.    Timing: all day   Context/Associated signs and symptoms: The patient had a gout flare 11/19 and treated this with a course of prednisone. The flare centered in his right foot and he states that it has migrated around his midfoot, ankle, heel, and toe. He is currently on prednisone 10 mg daily (from 30 mg daily). He has been treating his flare with ice packs as well, though he knows this is generally discouraged. His most recent uric acid was 5.9 (05/02/2014). His psoriatic lesions have improved but persist on his knees.     RHEUMATOLOGY REVIEW OF SYSTEMS   Positives as per history  Negatives as follows:  CONSTITUTlONAL:  Denies unexplained persistent fevers or weight change  RESPIRATORY:  No pleuritic pain, exertional dyspnea  CARDIOVASCULAR:  Denies chest pain  GASTRO:   Denies heartburn, abdominal pain, nausea, vomiting, diarrhea  SKIN:    Denies rash   MSK:    No morning stiffness >1 hour    PAST MEDICAL HISTORY  Reviewed with patient, significant changes in medical history - yes, gout flare 11/19     PHYSICAL EXAM  Blood pressure 137/98, pulse 85, temperature 97.4 ??F (36.3 ??C), temperature source Oral, height 5\' 10"  (1.778 m), weight 215 lb 9.6 oz (97.796 kg), SpO2 97 %.  GENERAL APPEARANCE: Well-nourished, no acute distress  NECK: No adenopathy  ENT: No oral ulcers  CARDIOVASCULAR: Heart rhythm is regular. No murmur, rub, gallop  CHEST: Normal vesicular breath sounds. No wheezes, rales, pleural friction rubs   ABDOMINAL: The abdomen is soft and nontender. Bowel sounds are normal  SKIN: Psoriatic rash on right shin, left knee - improved  MUSCULOSKELETAL: Negative Patrick's bilaterally  Upper extremities - Chronic synovial thickening of right wrist - improved. Dactylitis of right 5th finger - improved. Bilateral 5th finger bony prominence noted  Lower extremities - Left ankle swollen, tender, enthesitis, decreased ROM, synovial thickening - improved. Right foot tenderness.    LABS, RADIOLOGY AND PROCEDURES - Previous available labs, radiology and procedures were reviewed in detail with the patient.  The patient was counseled on the labs that were ordered and the meaning of positive and negative results and any disease implication that these labs may have.     ASSESSMENT  1. Psoriatic arthritis (Established problem -  Progressive disease) - We will try to approve 90 mg/0.265mL of Stelara prior to his next injection again. I believe he is well treated by this medicaiton and a higher dose would stabilize his disease. For now I do not think we need to add Otezla. If we need to switch biologics I discussed secukinimab.   2. Gout (Established problem -  Progressive disease) - The patient's most recent uric acid was elevated at 5.9 and he had a flare 11/19. I discussed that I would like his uric acid to be below a 3. He remains on prednisone 10 mg daily from 30 mg daily. We will increase his allopurinol to 600 mg daily. He will continue on colchicine 0.6 mg BID.  PLAN  1. Check uric acid, CMP, CBC  2. Continue allopurinol 400 mg daily, will most likely increase to 600 mg daily based on uric acid today, colchicine 0.6 mg qd  3. Prednisone taper     Bradey Luzier M. Allena KatzPatel, MD, FAAP Dallie DadFACP FACR   Adult and Pediatric Rheumatology     [facility] [place]  [address], [place], [place] [id number], Phone [phone], Fax [phone]     Visiting Assistant Professor of Pediatrics     Department of Pediatrics, [facility]   [id number], [place], [place] [id number], Phone [phone], Fax [phone]    Follow-up Disposition:  Return in about 1 month (around 07/15/2014).    cc:  Quintin AltoANDREW L ROSE, MD    Written by Ahmed Primahomas Michael Pender, scribe, as dictated by Neomia DearAarat M. Allena KatzPatel, M.D.

## 2014-06-16 LAB — CBC+PLATELET+HEM REVIEW
ABS. BASOPHILS: 0 10*3/uL (ref 0.0–0.2)
ABS. EOSINOPHILS: 0.1 10*3/uL (ref 0.0–0.4)
ABS. LYMPHOCYTES: 1.4 10*3/uL (ref 0.7–3.1)
ABS. MONOCYTES: 0.5 10*3/uL (ref 0.1–0.9)
ABS. NEUTROPHILS: 5.4 10*3/uL (ref 1.4–7.0)
BASOPHILS: 0 %
EOSINOPHILS: 1 %
HCT: 48.6 % (ref 37.5–51.0)
HGB: 16.1 g/dL (ref 12.6–17.7)
LYMPHOCYTES: 19 %
MCH: 29.5 pg (ref 26.6–33.0)
MCHC: 33.1 g/dL (ref 31.5–35.7)
MCV: 89 fL (ref 79–97)
MONOCYTES: 7 %
NEUTROPHILS: 73 %
PLATELET COMMENT: INCREASED
PLATELET: 433 10*3/uL — ABNORMAL HIGH (ref 150–379)
RBC: 5.46 x10E6/uL (ref 4.14–5.80)
RDW: 15.7 % — ABNORMAL HIGH (ref 12.3–15.4)
WBC: 7.4 10*3/uL (ref 3.4–10.8)

## 2014-06-16 LAB — METABOLIC PANEL, COMPREHENSIVE
A-G Ratio: 2.2 (ref 1.1–2.5)
ALT (SGPT): 63 IU/L — ABNORMAL HIGH (ref 0–44)
AST (SGOT): 51 IU/L — ABNORMAL HIGH (ref 0–40)
Albumin: 5 g/dL (ref 3.5–5.5)
Alk. phosphatase: 40 IU/L (ref 39–117)
BUN/Creatinine ratio: 13 (ref 9–20)
BUN: 13 mg/dL (ref 6–24)
Bilirubin, total: 0.3 mg/dL (ref 0.0–1.2)
CO2: 28 mmol/L (ref 18–29)
Calcium: 10.4 mg/dL — ABNORMAL HIGH (ref 8.7–10.2)
Chloride: 96 mmol/L — ABNORMAL LOW (ref 97–108)
Creatinine: 0.99 mg/dL (ref 0.76–1.27)
GFR est AA: 99 mL/min/{1.73_m2} (ref >59)
GFR est non-AA: 85 mL/min/{1.73_m2} (ref >59)
GLOBULIN, TOTAL: 2.3 g/dL (ref 1.5–4.5)
Glucose: 100 mg/dL — ABNORMAL HIGH (ref 65–99)
Potassium: 4.7 mmol/L (ref 3.5–5.2)
Protein, total: 7.3 g/dL (ref 6.0–8.5)
Sodium: 140 mmol/L (ref 134–144)

## 2014-06-16 LAB — URIC ACID: Uric acid: 4.7 mg/dL (ref 3.7–8.6)

## 2014-07-13 ENCOUNTER — Ambulatory Visit: Admit: 2014-07-13 | Payer: PRIVATE HEALTH INSURANCE | Attending: Rheumatology | Primary: Family Medicine

## 2014-07-13 ENCOUNTER — Encounter
Admit: 2014-07-13 | Discharge: 2014-07-13 | Payer: PRIVATE HEALTH INSURANCE | Attending: Family Medicine | Primary: Family Medicine

## 2014-07-13 DIAGNOSIS — M109 Gout, unspecified: Secondary | ICD-10-CM

## 2014-07-13 MED ORDER — ALLOPURINOL 300 MG TAB
300 mg | ORAL_TABLET | Freq: Every day | ORAL | Status: AC
Start: 2014-07-13 — End: 2014-08-12

## 2014-07-13 MED ORDER — COLCHICINE 0.6 MG TAB
0.6 mg | ORAL_TABLET | Freq: Two times a day (BID) | ORAL | Status: AC
Start: 2014-07-13 — End: 2014-08-12

## 2014-07-13 NOTE — Progress Notes (Signed)
RHEUMATOLOGY PROBLEM LIST AND CHIEF COMPLAINT  1. Psoriatic Arthritis - Psoriasis, episodic inflammatory arthritis, enthesitis, plantar fasciitis, dactylitis  In past: Enbrel, Humira  Stelara (current)  2. Gout - history of flares, elevated uric acid  most recent uric acid: 4.7 (06/14/2014)  Allopurinol (03/2014-current)    INTERVAL HISTORY  This is a 54 y.o. Caucasian male.  Today, the patient complains of pain in the joints.    Severity:  2 on a scale of 0-10.    Timing: all day   Context/Associated signs and symptoms: The patient states his Marcy Panning has been doubled to 90 mg. He has been taking allopurinol 500 mg daily, but would like to switch to 600 mg daily (tablets come in 300 mg, ease of dosing). He states he has had some pain in his right wrist since his last visit. He believes his psoriasis has worsened slightly (knees), but states this generally worsens in the Winter. He notes that he has some hard swelling on the back of his ankle and states this can be painful when pressured.     RHEUMATOLOGY REVIEW OF SYSTEMS   Positives as per history  Negatives as follows:  CONSTITUTlONAL:  Denies unexplained persistent fevers or weight change  RESPIRATORY:  No pleuritic pain, exertional dyspnea  CARDIOVASCULAR:  Denies chest pain  GASTRO:   Denies heartburn, abdominal pain, nausea, vomiting, diarrhea  SKIN:    Denies rash   MSK:    No morning stiffness >1 hour    PAST MEDICAL HISTORY  Reviewed with patient, significant changes in medical history - yes, gout flare 11/19     PHYSICAL EXAM  Blood pressure 142/84, pulse 89, temperature 97.6 ??F (36.4 ??C), temperature source Oral, height  (1.778 m), weight 220 lb 12.8 oz (100.154 kg), SpO2 96 %.  GENERAL APPEARANCE: Well-nourished, no acute distress  NECK: No adenopathy  ENT: No oral ulcers  CARDIOVASCULAR: Heart rhythm is regular. No murmur, rub, gallop  CHEST: Normal vesicular breath sounds. No wheezes, rales, pleural friction rubs   ABDOMINAL: The abdomen is soft and nontender. Bowel sounds are normal  SKIN: Psoriatic rash on right shin, left knee - slightly worsened, states this is normal for Winter  MUSCULOSKELETAL: Negative Patrick's bilaterally  Upper extremities - Chronic synovial thickening of right wrist - improved. Dactylitis of right 5th finger - improved. Bilateral 5th finger bony prominence noted  Lower extremities - Left ankle swollen, tender, enthesitis, decreased ROM, synovial thickening - improved. Right foot tenderness. Bony prominence/calcification on bilateral ankles    LABS, RADIOLOGY AND PROCEDURES   Previous labs reviewed -Yes    ASSESSMENT  1. Psoriatic arthritis (Established problem -  Progressive disease) - The patient has had Stelara 90 mg/0.13mL approved and will continue on this dose. He is scheduled to have his next infusion in February and we will see him one month afterwards. I still do not think we need to add a medication at this time (discussed Sulfasalazine, Otezla). If we need to switch biologics I discussed secukinimab.   2. Gout (Established problem -  Progressive disease) - The patient's most recent uric acid was 4.7 (06/14/2014) and we will check this again today. I discussed that I would like his uric acid to be below a 4. We will increase his allopurinol to 600 mg daily. He will continue on colchicine 0.6 mg BID.     PLAN  1. Check uric acid  2. Increase Allopurinol to 600 mg daily, continue colchicine 0.6 mg qd  3. Continue  Stelara at 90 mg q12weeks    Zeplin Aleshire M. Allena KatzPatel, MD, FAAP Dallie DadFACP FACR   Adult and Pediatric Rheumatology     [facility] [place]  [address], [place], [place] [address], Phone [phone], Fax [phone]     Visiting Assistant Professor of Pediatrics    Department of Pediatrics, [facility]   [id number], [place], [place] [id number], Phone [phone], Fax [phone]-    cc:  Quintin AltoANDREW L ROSE, MD     Written by Ahmed Primahomas Michael Pender, scribe, as dictated by Neomia DearAarat M. Allena KatzPatel, M.D.

## 2014-07-14 LAB — URIC ACID: Uric acid: 5.1 mg/dL (ref 3.7–8.6)

## 2014-09-11 ENCOUNTER — Ambulatory Visit
Admit: 2014-09-11 | Discharge: 2014-09-11 | Payer: PRIVATE HEALTH INSURANCE | Attending: Rheumatology | Primary: Family Medicine

## 2014-09-11 ENCOUNTER — Encounter
Admit: 2014-09-11 | Discharge: 2014-09-12 | Payer: PRIVATE HEALTH INSURANCE | Attending: Family Medicine | Primary: Family Medicine

## 2014-09-11 DIAGNOSIS — L4050 Arthropathic psoriasis, unspecified: Secondary | ICD-10-CM

## 2014-09-11 MED ORDER — ALLOPURINOL 100 MG TAB
100 mg | ORAL_TABLET | Freq: Every day | ORAL | Status: AC
Start: 2014-09-11 — End: 2014-10-11

## 2014-09-11 MED ORDER — ALLOPURINOL 300 MG TAB
300 mg | ORAL_TABLET | Freq: Every day | ORAL | Status: AC
Start: 2014-09-11 — End: 2014-10-11

## 2014-09-11 NOTE — Progress Notes (Signed)
RHEUMATOLOGY PROBLEM LIST AND CHIEF COMPLAINT  1. Psoriatic Arthritis (2015)- Psoriasis, episodic inflammatory arthritis, enthesitis, plantar fasciitis, dactylitis  In past: Enbrel, Humira  Stelara $Remove'90mg'FiHQHOZ$ (current)  2. Gout - history of flares, elevated uric acid  most recent uric acid: 5.1 (07/13/2014)  Allopurinol (03/2014-current)    INTERVAL HISTORY  This is a 54 y.o. Caucasian male.  Today, the patient complains of pain in the foot.    Severity:  2 on a scale of 0-10.    Timing: all day   Context/Associated signs and symptoms: The patient states he recently moved a lot of soil over the course of a week and his knees and ankles have been bothersome, he notes his right knee was recently swollen to the size of a cantaloupe. He also notes the toes in his right foot have been stiff and are red and somewhat hard to the touch. He has been taking ibuprofen 800 mg BID PRN with relief. He continues on allopurinol 600 mg daily and has been taking colchicine PRN due to diarrhea. Last visit his uric acid was 5.1 (07/13/2014). He received his last Stelara injection in February with no adverse effects. He notes that his dermatologist Lorette Ang) injects this for him.     RHEUMATOLOGY REVIEW OF SYSTEMS   Positives as per history  Negatives as follows:  CONSTITUTlONAL:  Denies unexplained persistent fevers or weight change  RESPIRATORY:  No pleuritic pain, exertional dyspnea  CARDIOVASCULAR:  Denies chest pain  GASTRO:   Denies heartburn, abdominal pain, nausea, vomiting, diarrhea  SKIN:    Denies rash   MSK:    No morning stiffness >1 hour    PAST MEDICAL HISTORY  Reviewed with patient, significant changes in medical history - yes, gout flare 11/19     PHYSICAL EXAM  Blood pressure 148/89, pulse 87, temperature 98 ??F (36.7 ??C), temperature source Oral, height $RemoveBefo'5\' 10"'JnTdzwkcjCQ$  (1.778 m), weight 224 lb 3.2 oz (101.696 kg), SpO2 97 %.  GENERAL APPEARANCE: Well-nourished, no acute distress  NECK: No adenopathy  ENT: No oral ulcers   CARDIOVASCULAR: Heart rhythm is regular. No murmur, rub, gallop  CHEST: Normal vesicular breath sounds. No wheezes, rales, pleural friction rubs  ABDOMINAL: The abdomen is soft and nontender. Bowel sounds are normal  SKIN: Psoriatic rash on right shin, left knee, right knee  MUSCULOSKELETAL: Negative Patrick's bilaterally  Upper extremities - Chronic synovial thickening of right wrist - resolved. Dactylitis of right 5th finger - resolved. Bilateral 5th finger bony prominence noted  Lower extremities - Right foot warmth, tenderness. Bony prominence/calcification on bilateral ankles    LABS, RADIOLOGY AND PROCEDURES   Previous labs reviewed -Yes, previous uric acid, cholesterol    ASSESSMENT  1. Psoriatic arthritis (Established problem -  Progressive disease) - The patient has had his Stelara 90 mg/0.29mL injection in February and is doing well, many of his pains today sound as if they are from gout or activity related pain from his recent 'work vacation'. He continues to have some psoriasis and occasional morning pains, so I discussed adding sulfasalazine to his regimen if this is persistent after the next stelara.  2. Gout (Established problem -  Progressive disease) - The patient's most recent uric acid was 5.1 (07/13/2014) and we will check this again today. I discussed that I would like his uric acid to be below a 4, as his right foot may be symptomatic today (warmth, redness, tenderness). We will increase his allopurinol to 700 mg daily. He will continue on colchicine 0.6  mg PRN, he does not need to take this BID as he states this gives him diarrhea.     PLAN  1. Check uric acid, Check CBC, CMP, markers of inflammation (ESR, CRP).  2. Increase Allopurinol to 700 mg daily, continue colchicine 0.6 mg PRN  3. Stelara injections at 90 mg q12weeks    Valeta Paz M. Posey Pronto, MD, [place]   Adult and Pediatric Rheumatology     [facility] [place], [place] [address], Phone [phone], Fax [phone]     Visiting Assistant Professor of Pediatrics    Department of Pediatrics, [facility] [place], [place] [address], Phone [phone], Fax [phone]-    cc:  Truddie Crumble, MD  Billie Ruddy, MD (Derm) phone: [phone]  [phone]    Written by Henrietta Hoover, scribe, as dictated by Mikal Plane. Posey Pronto, M.D.

## 2014-09-14 LAB — CBC+PLATELET+HEM REVIEW
ABS. BASOPHILS: 0 10*3/uL (ref 0.0–0.2)
ABS. EOSINOPHILS: 0.2 10*3/uL (ref 0.0–0.4)
ABS. LYMPHOCYTES: 1.3 10*3/uL (ref 0.7–3.1)
ABS. MONOCYTES: 0.4 10*3/uL (ref 0.1–0.9)
ABS. NEUTROPHILS: 2.6 10*3/uL (ref 1.4–7.0)
BASOPHILS: 1 %
EOSINOPHILS: 4 %
HCT: 44.3 % (ref 37.5–51.0)
HGB: 14.9 g/dL (ref 12.6–17.7)
LYMPHOCYTES: 28 %
MCH: 29.2 pg (ref 26.6–33.0)
MCHC: 33.6 g/dL (ref 31.5–35.7)
MCV: 87 fL (ref 79–97)
MONOCYTES: 10 %
NEUTROPHILS: 57 %
PLATELET COMMENT: INCREASED
PLATELET: 388 10*3/uL — ABNORMAL HIGH (ref 150–379)
RBC: 5.11 x10E6/uL (ref 4.14–5.80)
RDW: 15.1 % (ref 12.3–15.4)
WBC: 4.5 10*3/uL (ref 3.4–10.8)

## 2014-09-14 LAB — METABOLIC PANEL, COMPREHENSIVE
A-G Ratio: 2 (ref 1.1–2.5)
ALT (SGPT): 32 IU/L (ref 0–44)
AST (SGOT): 32 IU/L (ref 0–40)
Albumin: 4.2 g/dL (ref 3.5–5.5)
Alk. phosphatase: 38 IU/L — ABNORMAL LOW (ref 39–117)
BUN/Creatinine ratio: 14 (ref 9–20)
BUN: 13 mg/dL (ref 6–24)
Bilirubin, total: 0.4 mg/dL (ref 0.0–1.2)
CO2: 25 mmol/L (ref 18–29)
Calcium: 9.3 mg/dL (ref 8.7–10.2)
Chloride: 104 mmol/L (ref 97–108)
Creatinine: 0.93 mg/dL (ref 0.76–1.27)
GFR est AA: 106 mL/min/{1.73_m2} (ref >59)
GFR est non-AA: 92 mL/min/{1.73_m2} (ref >59)
GLOBULIN, TOTAL: 2.1 g/dL (ref 1.5–4.5)
Glucose: 98 mg/dL (ref 65–99)
Potassium: 4.6 mmol/L (ref 3.5–5.2)
Protein, total: 6.3 g/dL (ref 6.0–8.5)
Sodium: 141 mmol/L (ref 134–144)

## 2014-09-14 LAB — SED RATE (ESR): Sed rate (ESR): 17 mm/hr (ref 0–30)

## 2014-09-14 LAB — C REACTIVE PROTEIN, QT: C-Reactive Protein, Qt: 23.3 mg/L — ABNORMAL HIGH (ref 0.0–4.9)

## 2014-09-14 LAB — URIC ACID: Uric acid: 4.6 mg/dL (ref 3.7–8.6)

## 2014-09-25 ENCOUNTER — Ambulatory Visit
Admit: 2014-09-25 | Discharge: 2014-09-25 | Payer: PRIVATE HEALTH INSURANCE | Attending: Family Medicine | Primary: Family Medicine

## 2014-09-25 DIAGNOSIS — Z0000 Encounter for general adult medical examination without abnormal findings: Secondary | ICD-10-CM

## 2014-09-25 NOTE — Progress Notes (Signed)
1. Have you been to the ER, urgent care clinic since your last visit?  Hospitalized since your last visit?No    2. Have you seen or consulted any other health care providers outside of the [facility] since your last visit?  Include any pap smears or colon screening. No        Complete Physical Exam  Pre-Visit Questions:    1.  Do you follow a low fat diet?  no  2.  Are you up to date on your Tdap (<10 years)?  yes  3.  Have you ever had a Pneumovax vaccine?  yes  4.  Do you follow an exercise program?  yes  5.  Do you smoke?  no  6.  Do you consider yourself overweight?  yes  7.  Do you Testicular self exam?  yes  8.  Is there a family history of CAD< age 56?  no  9.  Is there a family history of Cancer?  no  10.  Do you know your Cancer risks?  no  11.  Have you had a colonoscopy?  yes

## 2014-09-25 NOTE — Progress Notes (Signed)
1. Have you been to the ER, urgent care clinic since your last visit?  Hospitalized since your last visit?No    2. Have you seen or consulted any other health care providers outside of the [facility] since your last visit?  Include any pap smears or colon screening. No        Complete Physical Exam  Pre-Visit Questions:    1.  Do you follow a low fat diet?  no  2.  Are you up to date on your Tdap (<10 years)?  yes  3.  Have you ever had a Pneumovax vaccine?  yes  4.  Do you follow an exercise program?  yes  5.  Do you smoke?  no  6.  Do you consider yourself overweight?  yes  7.  Do you Testicular self exam?  yes  8.  Is there a family history of CAD< age 56?  no  9.  Is there a family history of Cancer?  no  10.  Do you know your Cancer risks?  no  11.  Have you had a colonoscopy?  yes      Chief Complaint   Patient presents with   ??? Complete Physical     CPE- pt is fasting per Derm- needs CBC & CMP     he is a 54 y.o. year old male who presents for evalution.    Nurse notes were reviewed and are transcribed in this note.    Reviewed PmHx, RxHx, FmHx, SocHx, AllgHx and updated and dated in the chart.    Patient Active Problem List    Diagnosis   ??? Hypogonadism male   ??? Psoriasis   ??? Mixed hyperlipidemia       Review of Systems - negative except as listed above in the HPI    Objective:     Filed Vitals:    09/25/14 1023   BP: 139/79   Pulse: 87   Temp: 98 ??F (36.7 ??C)   TempSrc: Oral   Resp: 18   Height: 5\' 10"  (1.778 m)   Weight: 222 lb 7 oz (100.897 kg)   SpO2: 100%     Physical Examination: General appearance - alert, well appearing, and in no distress  Eyes - pupils equal and reactive, extraocular eye movements intact  Ears - bilateral TM's and external ear canals normal  Nose - normal and patent, no erythema, discharge or polyps  Mouth - mucous membranes moist, pharynx normal without lesions  Neck - supple, no significant adenopathy   Chest - clear to auscultation, no wheezes, rales or rhonchi, symmetric air entry  Heart - normal rate, regular rhythm, normal S1, S2, no murmurs, rubs, clicks or gallops  Abdomen - soft, nontender, nondistended, no masses or organomegaly  Extremities - peripheral pulses normal, no pedal edema, no clubbing or cyanosis    Assessment/ Plan:   Albert Baker was seen today for complete physical.    Diagnoses and all orders for this visit:    Routine general medical examination at a health care facility  Orders:  -     METABOLIC PANEL, COMPREHENSIVE  -     CBC WITH AUTOMATED DIFF  -     TSH, 3RD GENERATION  -     LIPID PANEL  -     PROSTATE SPECIFIC AG    Mixed hyperlipidemia  Orders:  -     METABOLIC PANEL, COMPREHENSIVE  -     LIPID PANEL  -will add  rx if inc         -Patient is in good health  -Discussed with patient cancer risk factors and screens needed  -Patient needs a colonoscopy no  -Labs from previous visits were discussed with patient yes  -Discussed with patient diet and exercise=yes  -Discussed with patient testicular (male)/breast self exam (male)= yes  Follow-up Disposition:  Return if symptoms worsen or fail to improve.    I have discussed the diagnosis with the patient and the intended plan as seen in the above orders.  The patient understands and agrees with the plan.  The patient has received an after-visit summary and questions were answered concerning future plans.     Medication Side Effects and Warnings were discussed with patient: yes  Patient Labs were reviewed and or requested: yes  Patient Past Records were reviewed and or requested  Yes      There are no Patient Instructions on file for this visit.        Danley Danker, M.D.

## 2014-09-26 LAB — LIPID PANEL
Cholesterol, total: 243 mg/dL — ABNORMAL HIGH (ref 100–199)
HDL Cholesterol: 49 mg/dL (ref >39)
LDL, calculated: 162 mg/dL — ABNORMAL HIGH (ref 0–99)
Triglyceride: 158 mg/dL — ABNORMAL HIGH (ref 0–149)
VLDL, calculated: 32 mg/dL (ref 5–40)

## 2014-09-26 LAB — CBC WITH AUTOMATED DIFF
ABS. BASOPHILS: 0 10*3/uL (ref 0.0–0.2)
ABS. EOSINOPHILS: 0.2 10*3/uL (ref 0.0–0.4)
ABS. IMM. GRANS.: 0 10*3/uL (ref 0.0–0.1)
ABS. MONOCYTES: 0.6 10*3/uL (ref 0.1–0.9)
ABS. NEUTROPHILS: 2.8 10*3/uL (ref 1.4–7.0)
Abs Lymphocytes: 1.3 10*3/uL (ref 0.7–3.1)
BASOPHILS: 0 %
EOSINOPHILS: 4 %
HCT: 45.1 % (ref 37.5–51.0)
HGB: 15.3 g/dL (ref 12.6–17.7)
IMMATURE GRANULOCYTES: 0 %
Lymphocytes: 27 %
MCH: 29 pg (ref 26.6–33.0)
MCHC: 33.9 g/dL (ref 31.5–35.7)
MCV: 85 fL (ref 79–97)
MONOCYTES: 11 %
NEUTROPHILS: 58 %
PLATELET: 347 10*3/uL (ref 150–379)
RBC: 5.28 x10E6/uL (ref 4.14–5.80)
RDW: 15.1 % (ref 12.3–15.4)
WBC: 4.8 10*3/uL (ref 3.4–10.8)

## 2014-09-26 LAB — PSA, DIAGNOSTIC (PROSTATE SPECIFIC AG): Prostate Specific Ag: 0.6 ng/mL (ref 0.0–4.0)

## 2014-09-26 LAB — METABOLIC PANEL, COMPREHENSIVE
A-G Ratio: 2.1 (ref 1.1–2.5)
ALT (SGPT): 28 IU/L (ref 0–44)
AST (SGOT): 36 IU/L (ref 0–40)
Albumin: 4.4 g/dL (ref 3.5–5.5)
Alk. phosphatase: 43 IU/L (ref 39–117)
BUN/Creatinine ratio: 14 (ref 9–20)
BUN: 14 mg/dL (ref 6–24)
Bilirubin, total: 0.7 mg/dL (ref 0.0–1.2)
CO2: 24 mmol/L (ref 18–29)
Calcium: 9.5 mg/dL (ref 8.7–10.2)
Chloride: 102 mmol/L (ref 97–108)
Creatinine: 1.01 mg/dL (ref 0.76–1.27)
GFR est AA: 96 mL/min/{1.73_m2} (ref >59)
GFR est non-AA: 83 mL/min/{1.73_m2} (ref >59)
GLOBULIN, TOTAL: 2.1 g/dL (ref 1.5–4.5)
Glucose: 93 mg/dL (ref 65–99)
Potassium: 4.2 mmol/L (ref 3.5–5.2)
Protein, total: 6.5 g/dL (ref 6.0–8.5)
Sodium: 141 mmol/L (ref 134–144)

## 2014-09-26 LAB — CVD REPORT

## 2014-09-26 LAB — TSH 3RD GENERATION: TSH: 1.23 u[IU]/mL (ref 0.450–4.500)

## 2014-10-04 MED ORDER — PREDNISONE 5 MG TAB
5 mg | ORAL_TABLET | ORAL | Status: DC
Start: 2014-10-04 — End: 2016-03-24

## 2014-11-09 MED ORDER — COLCHICINE 0.6 MG TAB
0.6 mg | ORAL_TABLET | Freq: Two times a day (BID) | ORAL | Status: DC
Start: 2014-11-09 — End: 2016-03-24

## 2014-11-13 ENCOUNTER — Encounter
Admit: 2014-11-13 | Discharge: 2014-11-13 | Payer: PRIVATE HEALTH INSURANCE | Attending: Family Medicine | Primary: Family Medicine

## 2014-11-13 ENCOUNTER — Ambulatory Visit
Admit: 2014-11-13 | Discharge: 2014-11-13 | Payer: PRIVATE HEALTH INSURANCE | Attending: Rheumatology | Primary: Family Medicine

## 2014-11-13 DIAGNOSIS — L4050 Arthropathic psoriasis, unspecified: Secondary | ICD-10-CM

## 2014-11-13 NOTE — Progress Notes (Signed)
RHEUMATOLOGY PROBLEM LIST AND CHIEF COMPLAINT  1. Psoriatic Arthritis (2015)- Psoriasis, episodic inflammatory arthritis, enthesitis, plantar fasciitis, dactylitis  In past: Enbrel, Humira  Stelara 90mg (current), Cosyntex ordered 11/2014  2. Gout - history of flares, elevated uric acid  most recent uric acid: 4.6 (09/11/2014)  Allopurinol (03/2014-current)    INTERVAL HISTORY  This is a 54 y.o. Caucasian male.  Today, the patient complains of pain in the foot.    Severity:  2 on a scale of 0-10.    Timing: all day   Context/Associated signs and symptoms: The patient states he had swelling of his right elbow a few weeks ago and he took a short course of prednisone with some relief, but states it still hurts at some angles. He states his right knee also began to swell and he still has some residual pain in this. His psoriasis on his right knee has worsened somewhat. He notes that he has noticed some changes in his nailbeds recently as well. His most recent uric acid (09/11/2014) was 4.6. He continues on allopurinol 700 mg daily with colchicine 0.6 mg PRN. He continues to receive Stelara injections q 12 weeks at 90 mg. He states he is doing better than he would without Stelara, but it is not fully effective.     RHEUMATOLOGY REVIEW OF SYSTEMS   Positives as per history  Negatives as follows:  CONSTITUTlONAL:  Denies unexplained persistent fevers or weight change  RESPIRATORY:  No pleuritic pain, exertional dyspnea  CARDIOVASCULAR:  Denies chest pain  GASTRO:   Denies heartburn, abdominal pain, nausea, vomiting, diarrhea  MSK:    No morning stiffness >1 hour    PAST MEDICAL HISTORY  Reviewed with patient, significant changes in medical history - yes, recent flares in right wrist, right knee     PHYSICAL EXAM  Blood pressure 138/82, pulse 91, temperature 96.9 ??F (36.1 ??C), temperature source Oral, height 5\' 10"  (1.778 m), weight 223 lb 9.6 oz (101.424 kg), SpO2 97 %.  GENERAL APPEARANCE: Well-nourished, no acute distress   NECK: No adenopathy  ENT: No oral ulcers  CARDIOVASCULAR: Heart rhythm is regular. No murmur, rub, gallop  CHEST: Normal vesicular breath sounds. No wheezes, rales, pleural friction rubs  ABDOMINAL: The abdomen is soft and nontender. Bowel sounds are normal  SKIN: Psoriatic rash on b/l knees  MUSCULOSKELETAL: Negative Patrick's bilaterally  Upper extremities - Chronic synovial thickening of right wrist with warmth, tenderness, mild effusion, decreased ROM. Mild olecranon bursitis on right. Bilateral 5th finger bony prominence noted. Right 5th digit dactylitis - minimal.   Lower extremities - Bony prominence/calcification on bilateral ankles. Right knee warmth.     LABS, RADIOLOGY AND PROCEDURES   Previous labs reviewed -Yes, previous uric acid    ASSESSMENT  1. Psoriatic arthritis (Established problem -  Progressive disease) - The patient continues on Stelara 90 mg q12 weeks, but is unsure if this is fully effective and his joints are worse on exam (right knee, right wrist). I discussed adding Otezla to Stelara or switching his biologic to cosyntex or back to Enbrel (worked very well). We went over possible side effects and insurance complications. After discussion, we decided to switch his biologic to cosyntex.  This has a favorable AE profile compared to stelara.  I will let his dermatologist know; he should receive his next stelara since cosyntex may take a few weeks to be approved.   2. Gout (Established problem -  Progressive disease) - The patient's most recent uric acid was  4.6 (09/11/2014), so I do not believe his recent joint symptoms are from gout. He states he has been compliant with his medications and will continue on allopurinol 700 mg daily with colchicine 0.6 mg PRN. We will check his uric acid today.     PLAN  1. Check uric acid  2. Allopurinol 700 mg daily, continue colchicine 0.6 mg PRN  3. Switch Stelara to Cosyntex    Jagar Lua M. Allena KatzPatel, MD, FAAP Dallie DadFACP FACR   Adult and Pediatric Rheumatology      [facility] [place]  [address], [place], [place] [id number], Phone [phone], Fax [phone]     Visiting Assistant Professor of Pediatrics    Department of Pediatrics, [facility]   [id number], [place], [place] [address], Phone [phone], Fax [phone]-    cc:  Quintin AltoANDREW L ROSE, MD  Nuala AlphaKim Panzarella, MD (Derm) phone: [phone]  [phone]    Written by Ahmed Primahomas Michael Pender, scribe, as dictated by Neomia DearAarat M. Allena KatzPatel, M.D.

## 2014-11-14 LAB — URIC ACID: Uric acid: 3.6 mg/dL — ABNORMAL LOW (ref 3.7–8.6)

## 2014-11-14 MED ORDER — PREDNISONE 5 MG TAB
5 mg | ORAL_TABLET | ORAL | Status: DC
Start: 2014-11-14 — End: 2015-12-24

## 2014-11-17 MED ORDER — COLCHICINE 0.6 MG CAPSULE
0.6 mg | ORAL_CAPSULE | Freq: Every day | ORAL | Status: AC
Start: 2014-11-17 — End: 2014-12-17

## 2014-12-14 NOTE — Progress Notes (Signed)
Received notification from [facility] Cosentyx was approved effective 10/18/14 through 03/17/2015.  [facility] is waiting to hear back from the patient has left a message for them.

## 2014-12-25 ENCOUNTER — Ambulatory Visit
Admit: 2014-12-25 | Discharge: 2014-12-25 | Payer: PRIVATE HEALTH INSURANCE | Attending: Rheumatology | Primary: Family Medicine

## 2014-12-25 DIAGNOSIS — M7021 Olecranon bursitis, right elbow: Secondary | ICD-10-CM

## 2014-12-25 NOTE — Progress Notes (Signed)
Received a call from [facility] pharmacy has been trying to reach patient via telephone in delivery of Cosentyx.  Patient stated he is not due for his Cosentyx injection until mid August he had an Stelara injection in May 2016 and needs to wait 3 months.

## 2014-12-25 NOTE — Progress Notes (Signed)
RHEUMATOLOGY PROBLEM LIST AND CHIEF COMPLAINT  1. Psoriatic Arthritis (2015)- Psoriasis, episodic inflammatory arthritis, enthesitis, plantar fasciitis, dactylitis  In past: Enbrel, Humira  Stelara (current -02/2015), Cosyntex ordered 11/2014, able to start 02/2015  2. Gout - history of flares, elevated uric acid  most recent uric acid: 4.6 (09/11/2014)  Allopurinol (03/2014-current)    INTERVAL HISTORY  This is a 54 y.o. Caucasian male.  Today, the patient complains of pain in the right elbow.    Severity:  0 on a scale of 0-10.    Timing: when touched  Context/Associated signs and symptoms: The patient states he has had pain in his right arm/elbow. He states that the pain started when he hit his elbow about two weeks ago. He states that the pain will wake him up at night and was much worse 4 days ago. He states that the rashes on his legs are worse. He admits to stubbing his third toe on the right foot. He has been called to have Cosyntex delivered but has told them he does not need this medication til 02/2015, when his Marcy Panning will have worn off.     RHEUMATOLOGY REVIEW OF SYSTEMS   Positives as per history  Negatives as follows:  CONSTITUTlONAL:  Denies unexplained persistent fevers or weight change  RESPIRATORY:  No pleuritic pain, exertional dyspnea  CARDIOVASCULAR:  Denies chest pain  GASTRO:   Denies heartburn, abdominal pain, nausea, vomiting, diarrhea  MSK:    No morning stiffness >1 hour    PAST MEDICAL HISTORY  Reviewed with patient, significant changes in medical history - yes, recent flare in right olecranon bursitis    PHYSICAL EXAM  Blood pressure 141/85, pulse 80, temperature 97.1 ??F (36.2 ??C), temperature source Oral, height  (1.778 m), weight 223 lb 12.8 oz (101.515 kg), SpO2 97 %.  GENERAL APPEARANCE: Well-nourished, no acute distress  NECK: No adenopathy  ENT: No oral ulcers  CARDIOVASCULAR: Heart rhythm is regular. No murmur, rub, gallop   CHEST: Normal vesicular breath sounds. No wheezes, rales, pleural friction rubs  ABDOMINAL: The abdomen is soft and nontender. Bowel sounds are normal  SKIN: Psoriatic rash on b/l knees, right elbow  MUSCULOSKELETAL: Negative Patrick's bilaterally  Upper extremities - Chronic synovial thickening of right wrist with warmth, tenderness, mild effusion, decreased ROM. Mild olecranon bursitis on right, warmth and tenderness in the right elbow. Bilateral 5th finger bony prominence noted. Right 5th digit dactylitis - minimal.   Lower extremities - Bony prominence/calcification on bilateral ankles. Right knee warmth.     LABS, RADIOLOGY AND PROCEDURES   Previous labs reviewed -Yes, previous uric acid    ASSESSMENT  1. Psoriatic arthritis (Established problem -  Progressive disease) - The patient is doing well, though I am not sure if symptoms in his left second toe and right elbow are related to trauma or his disease. I discussed that if these symptoms are from his disease they would be treated by Cosyntex. He will start this medication in August, 3 months after his last Stelara injection.   2. Gout (Established problem -  Progressive disease) - The patient's most recent uric acid was 3.6 (11/13/2014), so I do not believe his recent joint symptoms are from gout. Symptoms in his right elbow may be from admitted trauma. He will continue on allopurinol 700 mg daily with colchicine 0.6 mg PRN.  3. Olecranon bursitis - Likely from trauma and much improved.    PLAN  1. Allopurinol 700 mg daily, continue colchicine 0.6  mg PRN  2. Start Cosyntex in August  3. Use sleeve to protect olecranon area.    Maeley Matton M. Allena Katz, MD, FAAP Dallie Dad   Adult and Pediatric Rheumatology     [facility] [place]  [address] [place], [place] [address], Phone [phone], Fax [phone]     Visiting Assistant Professor of Pediatrics    Department of Pediatrics, [facility]    [address], [place], [place] [address], Phone [phone], Fax [phone]-    cc:  Quintin Alto, MD  Nuala Alpha, MD (Derm) phone: [phone]  [phone]    Written by Ahmed Prima, scribe, as dictated by Neomia Dear. Allena Katz, M.D.

## 2015-01-15 MED ORDER — ANDROGEL 20.25 MG/1.25 GRAM PER PUMP ACT. (1.62 %) TRANSDERMAL GEL
20.25 mg/1.25 gram (1.62 %) | TRANSDERMAL | Status: DC
Start: 2015-01-15 — End: 2015-08-21

## 2015-01-15 NOTE — Telephone Encounter (Signed)
rx called in

## 2015-02-21 ENCOUNTER — Encounter: Attending: Rheumatology | Primary: Family Medicine

## 2015-03-01 ENCOUNTER — Encounter: Attending: Rheumatology | Primary: Family Medicine

## 2015-03-21 ENCOUNTER — Encounter: Attending: Rheumatology | Primary: Family Medicine

## 2015-04-04 ENCOUNTER — Encounter: Attending: Rheumatology | Primary: Family Medicine

## 2015-04-24 ENCOUNTER — Ambulatory Visit
Admit: 2015-04-24 | Discharge: 2015-04-24 | Payer: PRIVATE HEALTH INSURANCE | Attending: Family Medicine | Primary: Family Medicine

## 2015-04-24 ENCOUNTER — Encounter: Attending: Family Medicine | Primary: Family Medicine

## 2015-04-24 DIAGNOSIS — E782 Mixed hyperlipidemia: Secondary | ICD-10-CM

## 2015-04-24 MED ORDER — DEXLANSOPRAZOLE 30 MG CAPSULE,MULTIPHASE DELAYED RELEASE
30 mg | ORAL_CAPSULE | Freq: Every day | ORAL | 5 refills | Status: DC
Start: 2015-04-24 — End: 2016-08-26

## 2015-04-24 NOTE — Telephone Encounter (Signed)
Patient would  Like to know if he needs a follow-up appointment at this time, he had to cancel for tomorrow, states is doing well, wondering if Dr. Allena KatzPatel needed any labs done....[phone]

## 2015-04-24 NOTE — Progress Notes (Signed)
A message has been sent to Dr Allena KatzPatel in regards to this matter.

## 2015-04-24 NOTE — Progress Notes (Signed)
1. Have you been to the ER, urgent care clinic since your last visit?  Hospitalized since your last visit?No    2. Have you seen or consulted any other health care providers outside of the [facility] since your last visit?  Include any pap smears or colon screening. No   Chief Complaint   Patient presents with   ??? Medication Problem     cosentyx 300mg  monthly   ??? Labs     labs- pt had coffee with honey   ??? Possible Hernia     1 week ago went to get out of bed an saw a bugle in abd area        Chief Complaint   Patient presents with   ??? Medication Problem     cosentyx 300mg  monthly   ??? Labs     labs- pt had coffee with honey   ??? Possible Hernia     1 week ago went to get out of bed an saw a bugle in abd area      he is a 54 y.o. year old male who presents for evalution.    Reviewed PmHx, RxHx, FmHx, SocHx, AllgHx and updated and dated in the chart.    Nurse notes were reviewed and copied in this note.    Patient Active Problem List    Diagnosis   ??? Hypogonadism male   ??? Psoriasis   ??? Mixed hyperlipidemia       Nurse notes were reviewed and copied and are correct  Review of Systems - negative except as listed above in the HPI    Objective:     Vitals:    04/24/15 1012   BP: 134/86   Pulse: 75   Resp: 18   Temp: 98.3 ??F (36.8 ??C)   TempSrc: Oral   SpO2: 99%   Weight: 226 lb 6 oz (102.7 kg)   Height: 5\' 10"  (1.778 m)     Physical Examination: General appearance - alert, well appearing, and in no distress  Chest - clear to auscultation, no wheezes, rales or rhonchi, symmetric air entry  Heart - normal rate, regular rhythm, normal S1, S2, no murmurs, rubs, clicks or gallops  Abdomen - soft, nontender, nondistended, no masses or organomegaly    Assessment/ Plan:   Albert Baker was seen today for medication problem, labs and possible hernia.    Diagnoses and all orders for this visit:    Mixed hyperlipidemia  -     LIPID PANEL  -     METABOLIC PANEL, COMPREHENSIVE    Hypogonadism male  -     PROSTATE SPECIFIC AG   -no hernia on exam    Psoriasis  -     CBC WITH AUTOMATED DIFF    Encounter for immunization  -     Tetanus, diphtheria toxoids and acellular pertussis (TDAP) vaccine, in individuals >=7 years, IM    Gastroesophageal reflux disease without esophagitis  -     dexlansoprazole (DEXILANT) 30 mg capsule; Take 1 Cap by mouth daily. Indications: GASTROESOPHAGEAL REFLUX       Follow-up Disposition:  Return in about 6 months (around 10/23/2015), or if symptoms worsen or fail to improve.    I have discussed the diagnosis with the patient and the intended plan as seen in the above orders.  The patient understands and agrees with the plan. The patient has received an after-visit summary and questions were answered concerning future plans.     Medication  Side Effects and Warnings were discussed with patient: yes  Patient Labs were reviewed and or requested: yes    Patient Past Records were reviewed and or requested: yes    Cheri Rous, M.D.    There are no Patient Instructions on file for this visit.

## 2015-04-24 NOTE — Progress Notes (Signed)
Patient thought appointment was for today, can't come for appointment tomorrow states he is doing well on new medication but is wondering if Dr. Allena KatzPatel wanted any labs as he is now going to his pcp Dr. [name].  Please call at [phone]

## 2015-04-25 ENCOUNTER — Encounter: Attending: Rheumatology | Primary: Family Medicine

## 2015-04-25 LAB — METABOLIC PANEL, COMPREHENSIVE
A-G Ratio: 2.1 (ref 1.1–2.5)
ALT (SGPT): 50 IU/L — ABNORMAL HIGH (ref 0–44)
AST (SGOT): 35 IU/L (ref 0–40)
Albumin: 4.6 g/dL (ref 3.5–5.5)
Alk. phosphatase: 36 IU/L — ABNORMAL LOW (ref 39–117)
BUN/Creatinine ratio: 14 (ref 9–20)
BUN: 14 mg/dL (ref 6–24)
Bilirubin, total: 0.4 mg/dL (ref 0.0–1.2)
CO2: 24 mmol/L (ref 18–29)
Calcium: 9.5 mg/dL (ref 8.7–10.2)
Chloride: 102 mmol/L (ref 97–106)
Creatinine: 0.99 mg/dL (ref 0.76–1.27)
GFR est AA: 99 mL/min/{1.73_m2} (ref >59)
GFR est non-AA: 85 mL/min/{1.73_m2} (ref >59)
GLOBULIN, TOTAL: 2.2 g/dL (ref 1.5–4.5)
Glucose: 100 mg/dL — ABNORMAL HIGH (ref 65–99)
Potassium: 4.5 mmol/L (ref 3.5–5.2)
Protein, total: 6.8 g/dL (ref 6.0–8.5)
Sodium: 142 mmol/L (ref 136–144)

## 2015-04-25 LAB — CBC WITH AUTOMATED DIFF
ABS. BASOPHILS: 0 10*3/uL (ref 0.0–0.2)
ABS. EOSINOPHILS: 0.2 10*3/uL (ref 0.0–0.4)
ABS. IMM. GRANS.: 0 10*3/uL (ref 0.0–0.1)
ABS. MONOCYTES: 0.7 10*3/uL (ref 0.1–0.9)
ABS. NEUTROPHILS: 2.8 10*3/uL (ref 1.4–7.0)
Abs Lymphocytes: 1.1 10*3/uL (ref 0.7–3.1)
BASOPHILS: 0 %
EOSINOPHILS: 3 %
HCT: 46.2 % (ref 37.5–51.0)
HGB: 15.6 g/dL (ref 12.6–17.7)
IMMATURE GRANULOCYTES: 1 %
Lymphocytes: 24 %
MCH: 29.3 pg (ref 26.6–33.0)
MCHC: 33.8 g/dL (ref 31.5–35.7)
MCV: 87 fL (ref 79–97)
MONOCYTES: 14 %
NEUTROPHILS: 58 %
PLATELET: 290 10*3/uL (ref 150–379)
RBC: 5.32 x10E6/uL (ref 4.14–5.80)
RDW: 15.7 % — ABNORMAL HIGH (ref 12.3–15.4)
WBC: 4.7 10*3/uL (ref 3.4–10.8)

## 2015-04-25 LAB — LIPID PANEL
Cholesterol, total: 267 mg/dL — ABNORMAL HIGH (ref 100–199)
HDL Cholesterol: 47 mg/dL (ref >39)
LDL, calculated: 194 mg/dL — ABNORMAL HIGH (ref 0–99)
Triglyceride: 128 mg/dL (ref 0–149)
VLDL, calculated: 26 mg/dL (ref 5–40)

## 2015-04-25 LAB — CVD REPORT

## 2015-04-25 LAB — PSA, DIAGNOSTIC (PROSTATE SPECIFIC AG): Prostate Specific Ag: 0.7 ng/mL (ref 0.0–4.0)

## 2015-04-25 NOTE — Telephone Encounter (Signed)
Called patient advised he can have his labs drawn at his next office visit.

## 2015-04-30 NOTE — Progress Notes (Signed)
[employer] healthcare form for [employer] completed and faxed.

## 2015-05-07 MED ORDER — ROSUVASTATIN 10 MG TAB
10 mg | ORAL_TABLET | Freq: Every evening | ORAL | 1 refills | Status: DC
Start: 2015-05-07 — End: 2015-06-28

## 2015-05-25 ENCOUNTER — Telehealth

## 2015-05-25 NOTE — Telephone Encounter (Signed)
Patient called regarding labs, and refill on Cosentyx,a appointment. Left message for patient to come in for labs, and an appointment. Form filled out for renewal of prior auth on Cosentyx, and sent to Dr. Allena KatzPatel to complete. Labs in computer.

## 2015-05-25 NOTE — Telephone Encounter (Signed)
-----   Message from Rigoberto NoelAarat Patel, MD sent at 05/24/2015  3:25 PM EST -----  Yes to all.  He should make a follow up.   ----- Message -----     From: Mechele Claudeeresa L Kline Bulthuis, LPN     Sent: 16/10/960411/17/2016   3:18 PM       To: Rigoberto NoelAarat Patel, MD    Patient wants to know does he need labs,and  Uric acid level done, and does he need appointment?

## 2015-05-28 NOTE — Telephone Encounter (Signed)
Form for Cosentyx sent to [facility] for insurance investigation for insurance coverage.

## 2015-06-13 NOTE — Telephone Encounter (Signed)
Left message refill for Cosentyx has been approved. Approval by [facility] Case ID# [id number] good 04/29/2015-09/26/2015. Will mail copy to patient. Left message to call the office with any questions.

## 2015-06-21 ENCOUNTER — Encounter: Attending: Rheumatology | Primary: Family Medicine

## 2015-06-28 MED ORDER — ROSUVASTATIN 10 MG TAB
10 mg | ORAL_TABLET | ORAL | 0 refills | Status: DC
Start: 2015-06-28 — End: 2015-08-15

## 2015-07-11 MED ORDER — ALLOPURINOL 100 MG TAB
100 mg | ORAL_TABLET | ORAL | 0 refills | Status: DC
Start: 2015-07-11 — End: 2015-07-25

## 2015-07-25 ENCOUNTER — Ambulatory Visit
Admit: 2015-07-25 | Discharge: 2015-07-25 | Payer: PRIVATE HEALTH INSURANCE | Attending: Rheumatology | Primary: Family Medicine

## 2015-07-25 DIAGNOSIS — E785 Hyperlipidemia, unspecified: Secondary | ICD-10-CM

## 2015-07-25 MED ORDER — ALLOPURINOL 300 MG TAB
300 mg | ORAL_TABLET | Freq: Every day | ORAL | 11 refills | Status: AC
Start: 2015-07-25 — End: 2015-08-24

## 2015-07-25 MED ORDER — ALLOPURINOL 100 MG TAB
100 mg | ORAL_TABLET | Freq: Every day | ORAL | 11 refills | Status: DC
Start: 2015-07-25 — End: 2016-06-12

## 2015-07-25 NOTE — Progress Notes (Signed)
RHEUMATOLOGY PROBLEM LIST AND CHIEF COMPLAINT  1. Psoriatic Arthritis (2015)- Psoriasis, episodic inflammatory arthritis, enthesitis, plantar fasciitis, dactylitis    Therapy History:  Prior NSAIDs:   Prior DMARDs: Enbrel, Humira, Stelara ('90mg'$  ?-02/2015)  Current NSAIDs:  Current DMARDs: Cosyntex (02/2015-current)    2. Gout - history of flares, elevated uric acid  most recent uric acid: 3.6 (11/13/2014)  Allopurinol (03/2014-current)    INTERVAL HISTORY  This is a 55 y.o. Caucasian male.  Today, the patient complains of pain in the right elbow.    Severity:  0 on a scale of 0-10.    Timing: when touched  Context/Associated signs and symptoms: The patient continues on allopurinol 700 mg daily and is no longer on colchicine. He reports that he had a small bit of pain in his wrist last summer and took prednisone for 2 days with relief. He also used "1-hour pain relief" for another small gout flare with relief. He reports some scaring over his right shin from a previous psoriasis patch and patches over his bilateral knees. He believes these patches have improved. He also notes that his elbows and nailbed psoriasis has resolved. He states that his joints are good, and he denies any morning stiffness of swelling. He continues on cosentyx 300 mg monthly.    RHEUMATOLOGY REVIEW OF SYSTEMS   Positives as per history  Negatives as follows:  CONSTITUTlONAL:  Denies unexplained persistent fevers or weight change  RESPIRATORY:  No pleuritic pain, exertional dyspnea  CARDIOVASCULAR:  Denies chest pain  GASTRO:   Denies heartburn, abdominal pain, nausea, vomiting, diarrhea  MSK:    No morning stiffness >1 hour    PAST MEDICAL HISTORY  Reviewed with patient, significant changes in medical history - No    PHYSICAL EXAM  Blood pressure 130/86, pulse 86, temperature 98.2 ??F (36.8 ??C), temperature source Oral, height '5\' 10"'$  (1.778 m), weight 226 lb 3.2 oz (102.6 kg), SpO2 98 %.  GENERAL APPEARANCE: Well-nourished, no acute distress   NECK: No adenopathy  ENT: No oral ulcers  CARDIOVASCULAR: Heart rhythm is regular. No murmur, rub, gallop  CHEST: Normal vesicular breath sounds. No wheezes, rales, pleural friction rubs  ABDOMINAL: The abdomen is soft and nontender. Bowel sounds are normal  SKIN: Psoriatic rash on b/l knees  MUSCULOSKELETAL: Negative Patrick's bilaterally  Upper extremities - Bilateral 5th finger bony prominence noted.  Lower extremities - Bony prominence over bilateral knees     LABS, RADIOLOGY AND PROCEDURES   Previous labs reviewed -Yes, previous uric acid    ASSESSMENT  1. Psoriatic arthritis (Established problem -  Progressive disease) - The patient has started Cosentyx. His joints have improved greatly since starting this medication and his psoriasis is also improving. He continues to have psoriasis patches over his knees. I discussed starting a topical cream to help treat his psoriasis lesions and I have recommended he speak with dermatology. He is doing well on cosentyx 300 mg monthly, so he should continue this medication. I will check labs today including a lipid panel per his request. He should return in 5 months for a follow up.   2. Gout (Established problem -  Progressive disease) - The patient's most recent uric acid was 3.6 (11/13/2014). He continues on allopurinol 700 mg daily, but he has had 2 small flares since his previous visit so he should remain on this dose. He should continue to use prednisone as an abortive measure for a gout flare and he should call if he has a  persistent flare.     PLAN  1. Allopurinol 700 mg daily, prednisone for a flare  2. Cosyntex 300 mg monthly  3. Check CBC, CMP, markers of inflammation (ESR, CRP), uric acid, lipid panel  4. Return in 5 months  5. Consider topical agent for rash    Shawnia Vizcarrondo M. Posey Pronto, MD, Emily   Adult and Pediatric Rheumatology     [facility] [place], [place] [address], Phone [phone], Fax  [phone]     Visiting Assistant Professor of Pediatrics    Department of Pediatrics, [facility] of [place], [place] [address], Phone [phone], Fax [phone]-    There are no Patient Instructions on file for this visit.  Follow-up Disposition:  Return in about 5 months (around 12/23/2015).    cc:  Truddie Crumble, MD  Billie Ruddy, MD (Derm) phone: [phone]  [phone]    Written by Shelbie Proctor, scribe, as dictated by Mikal Plane. Posey Pronto, M.D.

## 2015-07-28 LAB — METABOLIC PANEL, COMPREHENSIVE
A-G Ratio: 2 (ref 1.1–2.5)
ALT (SGPT): 47 IU/L — ABNORMAL HIGH (ref 0–44)
AST (SGOT): 35 IU/L (ref 0–40)
Albumin: 4.7 g/dL (ref 3.5–5.5)
Alk. phosphatase: 38 IU/L — ABNORMAL LOW (ref 39–117)
BUN/Creatinine ratio: 16 (ref 9–20)
BUN: 15 mg/dL (ref 6–24)
Bilirubin, total: 0.6 mg/dL (ref 0.0–1.2)
CO2: 27 mmol/L (ref 18–29)
Calcium: 9.9 mg/dL (ref 8.7–10.2)
Chloride: 101 mmol/L (ref 96–106)
Creatinine: 0.95 mg/dL (ref 0.76–1.27)
GFR est AA: 103 mL/min/{1.73_m2} (ref >59)
GFR est non-AA: 89 mL/min/{1.73_m2} (ref >59)
GLOBULIN, TOTAL: 2.3 g/dL (ref 1.5–4.5)
Glucose: 97 mg/dL (ref 65–99)
Potassium: 4.7 mmol/L (ref 3.5–5.2)
Protein, total: 7 g/dL (ref 6.0–8.5)
Sodium: 142 mmol/L (ref 134–144)

## 2015-07-28 LAB — CBC+PLATELET+HEM REVIEW
ABS. BASOPHILS: 0 10*3/uL (ref 0.0–0.2)
ABS. EOSINOPHILS: 0.1 10*3/uL (ref 0.0–0.4)
ABS. LYMPHOCYTES: 1.7 10*3/uL (ref 0.7–3.1)
ABS. MONOCYTES: 0.6 10*3/uL (ref 0.1–0.9)
ABS. NEUTROPHILS: 2.9 10*3/uL (ref 1.4–7.0)
BASOPHILS: 0 %
EOSINOPHILS: 2 %
HCT: 49.1 % (ref 37.5–51.0)
HGB: 16.5 g/dL (ref 12.6–17.7)
LYMPHOCYTES: 32 %
MCH: 29.9 pg (ref 26.6–33.0)
MCHC: 33.6 g/dL (ref 31.5–35.7)
MCV: 89 fL (ref 79–97)
MONOCYTES: 11 %
NEUTROPHILS: 55 %
PLATELET COMMENT: ADEQUATE
PLATELET: 350 10*3/uL (ref 150–379)
RBC COMMENT: NORMAL
RBC: 5.51 x10E6/uL (ref 4.14–5.80)
RDW: 14.6 % (ref 12.3–15.4)
WBC: 5.2 10*3/uL (ref 3.4–10.8)

## 2015-07-28 LAB — LIPID PANEL
Cholesterol, total: 193 mg/dL (ref 100–199)
HDL Cholesterol: 49 mg/dL (ref >39)
LDL, calculated: 123 mg/dL — ABNORMAL HIGH (ref 0–99)
Triglyceride: 107 mg/dL (ref 0–149)
VLDL, calculated: 21 mg/dL (ref 5–40)

## 2015-07-28 LAB — SED RATE (ESR): Sed rate (ESR): 7 mm/hr (ref 0–30)

## 2015-07-28 LAB — C REACTIVE PROTEIN, QT: C-Reactive Protein, Qt: 1.9 mg/L (ref 0.0–4.9)

## 2015-07-28 LAB — URIC ACID: Uric acid: 3.7 mg/dL (ref 3.7–8.6)

## 2015-08-06 NOTE — Progress Notes (Addendum)
Androgel denied due to insufficient information.   Ref# [id number]

## 2015-08-06 NOTE — Progress Notes (Signed)
PA for Androgel 20.25mg /ACT (1.62%) submitted to [facility], awaiting response.

## 2015-08-15 MED ORDER — ROSUVASTATIN 10 MG TAB
10 mg | ORAL_TABLET | ORAL | 0 refills | Status: DC
Start: 2015-08-15 — End: 2015-09-16

## 2015-08-21 MED ORDER — TESTOSTERONE 1.62 % (20.25 MG/1.25 GRAM) TRANSDERMAL GEL PUMP
20.25 mg/1.25 gram (1.62 %) | TRANSDERMAL | 5 refills | Status: DC
Start: 2015-08-21 — End: 2015-09-06

## 2015-08-27 MED ORDER — COSENTYX PEN 300 MG/2 PENS (150 MG/ML) SUBCUTANEOUS
150 mg/mL | PEN_INJECTOR | SUBCUTANEOUS | 3 refills | Status: DC
Start: 2015-08-27 — End: 2015-08-27

## 2015-08-27 MED ORDER — COSENTYX PEN 300 MG/2 PENS (150 MG/ML) SUBCUTANEOUS
150 mg/mL | PEN_INJECTOR | SUBCUTANEOUS | 3 refills | Status: DC
Start: 2015-08-27 — End: 2016-03-24

## 2015-08-27 NOTE — Telephone Encounter (Signed)
Spoke with patient, and he states he has new insurance. Also received fax from [facility] regarding need for prior auth for Cosentyx. Spoke with Morene Crocker. At [facility] prior auth approved good 06/28/2015-02/22/2017, no prior Berkley Harvey number is given states none is need, and to send prescription to the pharmacy. Left message Cosentyx approved, and will send prescription to [facility].

## 2015-08-27 NOTE — Addendum Note (Signed)
Addended by: Rigoberto Noel on: 08/27/2015 04:04 PM      Modules accepted: Orders

## 2015-08-28 NOTE — Progress Notes (Signed)
PA for Androgel faxed to [facility], awaiting response.

## 2015-09-06 MED ORDER — TESTOSTERONE 30 MG/ACTUATION (1.5 ML) TRANSDERM SOLUTION METERED PUMP
30 mg/actuation (1.5 mL) | Freq: Every day | TRANSDERMAL | 5 refills | Status: DC
Start: 2015-09-06 — End: 2017-03-12

## 2015-09-06 NOTE — Telephone Encounter (Signed)
From: Albert Baker  Sent:  09/05/2015 11:09 AM EST  To:  Albert AltoAndrew L Daniyah Fohl, Albert Baker  Subject:  RE: Prescription Question    Good  Morning Albert.    I  received a note from my new insurance carrier, [employer]. They do not cover the Androgel. They do cover alternatives so i took some pics of the documents so you could see if a change is possible. I noticed that the Androderm offers a copay assist programme like the Androgel did... perhaps this is an alternative. Please let me know what Dr [name] thinks we can do on this potential substitution.  cheers  Albert Baker  -----  Message -----  From:  Albert GraffMarylee Jones, Albert Baker  Sent:  4/54/09812/16/2017 5:27 PM EST  To:  Albert Baker  Subject:  RE: Prescription Question    Mr.  Baker, Your [facility] for [facility]rogel was [facility] as ordered.   If  there are any problems, please let me know.  Thanks,    Albert.    -----  Message -----    From: Albert Baker    Sent: 08/20/2015 6:38 PM EST    To: Albert AltoAndrew L Jaxtin Raimondo, Albert Baker  Subject:  Prescription Question    Dr  [name],    Can  we renew the scritp for the Androgel please ?    Cheers  Albert Baker

## 2015-09-11 NOTE — Progress Notes (Signed)
PA for Axiron submitted to [facility] via cover my meds , awaiting response.

## 2015-09-17 MED ORDER — ROSUVASTATIN 10 MG TAB
10 mg | ORAL_TABLET | ORAL | 0 refills | Status: DC
Start: 2015-09-17 — End: 2015-10-17

## 2015-09-17 NOTE — Telephone Encounter (Signed)
Patient informed Cosentyx was approved by [facility] [employer] Id # [id number], good 06/28/2015-02/22/2017. Patient states he getting Cosentyx from [facility].

## 2015-09-19 NOTE — Telephone Encounter (Signed)
Spoke with Albert Baker at [facility] informed Cosentyx has been approved, and gave her the information (see note on 09/17/2015. States it will take 48 hours to check approval. Patient informed. Patient also states he has copy of the approval he got in the mail.

## 2015-10-04 NOTE — Telephone Encounter (Signed)
Called [facility] verified dosage/directions for Cosentyx 300 mg.  Called patient informed him he should be receiving a call about delivery of medication.

## 2015-10-11 NOTE — Progress Notes (Signed)
PA for generic Androgel 1% approved from 08/12/15 thru 10/10/16, pt informed , script called to [facility]- 6 pumps every day 4 bottles/ 90 days refill 3.

## 2015-10-12 NOTE — Telephone Encounter (Signed)
[facility] called. Script was called in regarding Androgel. They need to confirm the directions and dose for med. Please call pharm at [phone].

## 2015-10-14 NOTE — Telephone Encounter (Signed)
Nurse

## 2015-10-15 NOTE — Telephone Encounter (Signed)
Incoming call from [facility]. Per pharmacist would like to confirm directions and dosage for Androgel 1%. Per physician direction are to apply six pumps by transdermal route daily. Directions repeated for clarification and understanding verbalized.

## 2015-10-15 NOTE — Telephone Encounter (Signed)
Pharmacy called, states that they have already heard from our office to confirm directions.

## 2015-10-17 MED ORDER — ROSUVASTATIN 10 MG TAB
10 mg | ORAL_TABLET | ORAL | 0 refills | Status: DC
Start: 2015-10-17 — End: 2015-11-15

## 2015-11-15 MED ORDER — ROSUVASTATIN 10 MG TAB
10 mg | ORAL_TABLET | ORAL | 0 refills | Status: DC
Start: 2015-11-15 — End: 2016-01-17

## 2015-12-24 ENCOUNTER — Encounter: Admit: 2015-12-24 | Discharge: 2015-12-25 | Attending: Family Medicine | Primary: Family Medicine

## 2015-12-24 ENCOUNTER — Ambulatory Visit: Admit: 2015-12-24 | Discharge: 2015-12-24 | Attending: Rheumatology | Primary: Family Medicine

## 2015-12-24 DIAGNOSIS — L4050 Arthropathic psoriasis, unspecified: Secondary | ICD-10-CM

## 2015-12-24 MED ORDER — ETANERCEPT 25 MG/0.5 ML (0.5 ML) SUBCUTANEOUS SYRINGE
25 mg/0.5mL     1) | INJECTION | SUBCUTANEOUS | 6 refills | Status: DC
Start: 2015-12-24 — End: 2016-01-18

## 2015-12-24 MED ORDER — PREDNISONE 5 MG TAB
5 mg | ORAL_TABLET | Freq: Two times a day (BID) | ORAL | 3 refills | Status: DC
Start: 2015-12-24 — End: 2016-03-24

## 2015-12-24 NOTE — Progress Notes (Signed)
RHEUMATOLOGY PROBLEM LIST AND CHIEF COMPLAINT  1. Psoriatic Arthritis (2015)- Psoriasis, episodic inflammatory arthritis, enthesitis, plantar fasciitis, dactylitis    Therapy History:  Prior DMARDs: Humira, Stelara ('90mg'$  ?-02/2015),  Cosyntex (02/2015-12/2015)  Current DMARDs: Enbrel (past noncompliant, reordered 12/2015)    2. Gout - history of flares, elevated uric acid  most recent uric acid: 3.7 (07/25/2015)  Allopurinol (03/2014-current)    INTERVAL HISTORY  This is a 55 y.o. Caucasian male.  Today, the patient complains of no pain in the joints.  Location: NA  Severity:  0 on a scale of 0-10  Timing: all day   Duration:  6 months  Modifying factors: Cosentyx  Context/Associated signs and symptoms: The patient presents with psoriasis patches on both legs. He admits to using a topical steroid with some reduction in the scales on his patches. He denies any joint pain, swelling, or morning stiffness, but he reports that he has some "psoriasis on his left 2nd fingernail." He states that his dose of Cosentyx 300 mg monthly is no longer working. The patient has not had any gout flares recently. He continues on allopurinol and notes that he reduced his dose to 600 mg daily for the past 6 months. The patient admits to being noncompliant to Enbrel while on this in the past. His dermatologist switched him to Humira but states that it was not as effective.     RHEUMATOLOGY REVIEW OF SYSTEMS   Positives as per history  Negatives as follows:  CONSTITUTlONAL:  Denies unexplained persistent fevers or weight change  RESPIRATORY:  No pleuritic pain, exertional dyspnea  CARDIOVASCULAR:  Denies chest pain  GASTRO:   Denies heartburn, abdominal pain, nausea, vomiting, diarrhea  MSK:    No morning stiffness >1 hour  HEMATOLOGIC:  No easy bruising, purpura, swollen lymph nodes   VASCULAR:   Denies edema, cyanosis, raynaud phenomenon  NEUROLOGIC:  Denies specific muscle weakness, paresthesias    PSYCHIATRIC:  No sleep disturbance / snoring, depression, anxiety      PAST MEDICAL HISTORY  Reviewed with patient, significant changes in medical history - No    PHYSICAL EXAM  Blood pressure 137/81, pulse 86, temperature 97.9 ??F (36.6 ??C), temperature source Oral, resp. rate 18, height '5\' 10"'$  (1.778 m), weight 223 lb 9.6 oz (101.4 kg), SpO2 95 %.  GENERAL APPEARANCE: Well-nourished, no acute distress  EYES: No scleral erythema, conjunctival injection  ENT: No oral ulcer, parotid enlargement  NECK: No adenopathy, thyroid enlargement  CARDIOVASCULAR: Heart rhythm is regular. No murmur, rub, gallop  CHEST: Normal vesicular breath sounds. No wheezes, rales, pleural friction rubs  ABDOMINAL: The abdomen is soft and nontender. Bowel sounds are normal  EXTREMITIES: There is no evidence of clubbing, cyanosis, edema  SKIN: Psoriatic rash on b/l knees and shins, onycholysis left 2nd fingernail  MUSCULOSKELETAL: Negative Patrick's bilaterally  Upper extremities - Bilateral 5th finger bony prominence noted.  Lower extremities - Bony prominence over bilateral knees     LABS, RADIOLOGY AND PROCEDURES   Previous labs reviewed -Yes    ASSESSMENT  1. Psoriatic arthritis (Established problem -  Progressive disease) - The patient's psoriasis is flaring. He has psoriasis patches on both legs. He does not believe that his Cosentyx is no longer effective. Enbrel was effective at treating his joint symptoms and his rash in the past but he was not fully compliant, so this was eventually switched. I want him to switch back to Enbrel in hopes that this was still be effective. I will check  his labs today including a lipid panel per his request.   2. Gout (Established problem -  Stable disease) - The patient's most recent uric acid was 3.7 (07/24/2015). The patient has not had any recent gout flares. He continues on allopurinol and reduced his dose to '600mg'$ . I will check his uric acid again today and if this is over 5 I will increase  his dose back to 700 mg daily. He should continue to use prednisone as an abortive measure for a gout flare and he should call if he has a persistent flare.   3. New medication - enbrel - A written summary, as prepared by the [employer] of Rheumatology was provided.  The patient was given the opportunity to ask questions, and verbalized understanding of the following: The most common side effect seen with the injectable drugs (etanercept) are skin reactions, commonly referred to as "injection site reactions."  This can last up to 1 week.  The most significant side effects of anti-TNF therapy are increased risk for all types of infections, including TB and fungal infections. Some of these infections may be severe.  The patient is tested for TB and hepatitis prior to starting therapy.  The medication should be stopped if there is high fever or if the patient is being treated with antibiotics for an infection.  Long term anti-TNF agents may increase the risk of cancers such as lymphoma and skin cancer.  There are rare neurologic complications from the use of these medications. People who have a history of multiple sclerosis should not use them. People with significant heart failure should not be on anti-TNF therapy.  Using these medications may make the vaccination less effective and live vaccines should not be given.    PLAN  1. Allopurinol 600 mg daily, prednisone '5mg'$  for a flare  2. Stop Cosentyx, restart Enbrel 25 mg twice weekly   3. Check CBC, CMP, markers of inflammation (ESR, CRP), uric acid, lipid panel, TSH  4. Rule out hepatitis in anticipation of possible anti-TNF therapy  5. Continue topical agent for rash  6. Return in 2 months    Dorrian Doggett M. Posey Pronto, MD  Adult and Pediatric Rheumatology     [facility] of [place], [place] [address], Phone [phone], Fax [phone]     Visiting Assistant Professor of Pediatrics     Department of Pediatrics, [facility] of [place], [place] [address], Phone [phone], Fax [phone]    There are no Patient Instructions on file for this visit.    cc:  Truddie Crumble, MD  Billie Ruddy, MD (Derm) phone: [phone]  [phone]    Written by Shelbie Proctor, scribe, as dictated by Mikal Plane. Posey Pronto, M.D.

## 2015-12-25 LAB — CBC+PLATELET+HEM REVIEW
ABS. BASOPHILS: 0 10*3/uL (ref 0.0–0.2)
ABS. EOSINOPHILS: 0.2 10*3/uL (ref 0.0–0.4)
ABS. LYMPHOCYTES: 1.7 10*3/uL (ref 0.7–3.1)
ABS. MONOCYTES: 0.5 10*3/uL (ref 0.1–0.9)
ABS. NEUTROPHILS: 3 10*3/uL (ref 1.4–7.0)
BASOPHILS: 0 %
EOSINOPHILS: 3 %
HCT: 50.3 % (ref 37.5–51.0)
HGB: 16.7 g/dL (ref 12.6–17.7)
LYMPHOCYTES: 31 %
MCH: 29.8 pg (ref 26.6–33.0)
MCHC: 33.2 g/dL (ref 31.5–35.7)
MCV: 90 fL (ref 79–97)
MONOCYTES: 10 %
NEUTROPHILS: 56 %
PLATELET COMMENT: ADEQUATE
PLATELET: 305 10*3/uL (ref 150–379)
RBC COMMENT: NORMAL
RBC: 5.6 x10E6/uL (ref 4.14–5.80)
RDW: 14.9 % (ref 12.3–15.4)
WBC: 5.4 10*3/uL (ref 3.4–10.8)

## 2015-12-25 LAB — TSH 3RD GENERATION: TSH: 1.02 u[IU]/mL (ref 0.450–4.500)

## 2015-12-25 LAB — LIPID PANEL
Cholesterol, total: 189 mg/dL (ref 100–199)
HDL Cholesterol: 58 mg/dL (ref >39)
LDL, calculated: 117 mg/dL — ABNORMAL HIGH (ref 0–99)
Triglyceride: 71 mg/dL (ref 0–149)
VLDL, calculated: 14 mg/dL (ref 5–40)

## 2015-12-25 LAB — METABOLIC PANEL, COMPREHENSIVE
A-G Ratio: 2.1 (ref 1.2–2.2)
ALT (SGPT): 44 IU/L (ref 0–44)
AST (SGOT): 32 IU/L (ref 0–40)
Albumin: 4.7 g/dL (ref 3.5–5.5)
Alk. phosphatase: 42 IU/L (ref 39–117)
BUN/Creatinine ratio: 15 (ref 9–20)
BUN: 15 mg/dL (ref 6–24)
Bilirubin, total: 0.3 mg/dL (ref 0.0–1.2)
CO2: 24 mmol/L (ref 18–29)
Calcium: 10.2 mg/dL (ref 8.7–10.2)
Chloride: 104 mmol/L (ref 96–106)
Creatinine: 0.98 mg/dL (ref 0.76–1.27)
GFR est AA: 99 mL/min/{1.73_m2} (ref >59)
GFR est non-AA: 86 mL/min/{1.73_m2} (ref >59)
GLOBULIN, TOTAL: 2.2 g/dL (ref 1.5–4.5)
Glucose: 95 mg/dL (ref 65–99)
Potassium: 5 mmol/L (ref 3.5–5.2)
Protein, total: 6.9 g/dL (ref 6.0–8.5)
Sodium: 144 mmol/L (ref 134–144)

## 2015-12-25 LAB — C REACTIVE PROTEIN, QT: C-Reactive Protein, Qt: 2.3 mg/L (ref 0.0–4.9)

## 2015-12-25 LAB — URIC ACID: Uric acid: 3.6 mg/dL — ABNORMAL LOW (ref 3.7–8.6)

## 2015-12-25 LAB — INTERPRETATION

## 2015-12-25 LAB — HEPATITIS B CORE AB, TOTAL: Hep B Core Ab, total: NEGATIVE

## 2015-12-25 LAB — HEPATITIS B SURF AB QUANT
HEPATITIS B SURF AB QUANT, 006531: 109.9 m[IU]/mL (ref >9.9)
Hepatitis B surf Ab, QT: 109.9 m[IU]/mL (ref >9.9)

## 2015-12-25 LAB — SED RATE (ESR): Sed rate (ESR): 7 mm/hr (ref 0–30)

## 2015-12-25 LAB — HEP B SURFACE AG: Hep B surface Ag screen: NEGATIVE

## 2015-12-25 LAB — HCV AB W/RFLX TO NAA
HCV AB, 144035: <0.1 s/co ratio (ref 0.0–0.9)
HCV Ab: <0.1 s/co ratio (ref 0.0–0.9)

## 2015-12-25 LAB — HEPATITIS B CORE ANTIBODY, TOTAL: Hep B Core Total Ab: NEGATIVE

## 2016-01-17 MED ORDER — ROSUVASTATIN 10 MG TAB
10 mg | ORAL_TABLET | ORAL | 0 refills | Status: DC
Start: 2016-01-17 — End: 2016-02-12

## 2016-01-18 MED ORDER — ETANERCEPT 25 MG/0.5 ML (0.5 ML) SUBCUTANEOUS SYRINGE
25 mg/0.5mL     1) | INJECTION | SUBCUTANEOUS | 6 refills | Status: DC
Start: 2016-01-18 — End: 2016-01-18

## 2016-01-18 MED ORDER — ETANERCEPT 25 MG/0.5 ML (0.5 ML) SUBCUTANEOUS SYRINGE
25 mg/0.5mL     1) | INJECTION | SUBCUTANEOUS | 6 refills | Status: DC
Start: 2016-01-18 — End: 2016-01-29

## 2016-01-21 ENCOUNTER — Encounter: Attending: Rheumatology | Primary: Family Medicine

## 2016-01-29 MED ORDER — ETANERCEPT 25 MG/0.5 ML (0.5 ML) SUBCUTANEOUS SYRINGE
25 mg/0.5mL     1) | INJECTION | SUBCUTANEOUS | 6 refills | Status: DC
Start: 2016-01-29 — End: 2016-02-27

## 2016-01-29 MED ORDER — ETANERCEPT 25 MG/0.5 ML (0.5 ML) SUBCUTANEOUS SYRINGE
25 mg/0.5mL     1) | INJECTION | SUBCUTANEOUS | 6 refills | Status: DC
Start: 2016-01-29 — End: 2016-01-29

## 2016-02-01 NOTE — Telephone Encounter (Signed)
Faxed prior auth request to [facility] for Enbrel approval.

## 2016-02-12 MED ORDER — ROSUVASTATIN 10 MG TAB
10 mg | ORAL_TABLET | ORAL | 0 refills | Status: DC
Start: 2016-02-12 — End: 2016-03-13

## 2016-02-27 MED ORDER — ENBREL SURECLICK 50 MG/ML (1 ML) SUBCUTANEOUS PEN INJECTOR
50 mg/mL (1 mL) | SUBCUTANEOUS | 2 refills | Status: DC
Start: 2016-02-27 — End: 2016-03-24

## 2016-02-27 NOTE — Progress Notes (Signed)
Switched to 50mg  weekly.

## 2016-03-12 NOTE — Telephone Encounter (Signed)
Left message for patient to call the office in regards to if he is on Enbrel 50 mg once a week, or Enbrel 25 mg twice a week. Patient was approved for the Enbrel 25 mg twice a week by [facility].

## 2016-03-13 MED ORDER — ROSUVASTATIN 10 MG TAB
10 mg | ORAL_TABLET | ORAL | 0 refills | Status: DC
Start: 2016-03-13 — End: 2016-04-29

## 2016-03-24 ENCOUNTER — Encounter: Attending: Rheumatology | Primary: Family Medicine

## 2016-03-24 ENCOUNTER — Ambulatory Visit: Admit: 2016-03-24 | Discharge: 2016-03-24 | Attending: Rheumatology | Primary: Family Medicine

## 2016-03-24 DIAGNOSIS — L4050 Arthropathic psoriasis, unspecified: Secondary | ICD-10-CM

## 2016-03-24 MED ORDER — ALLOPURINOL 300 MG TAB
300 mg | ORAL_TABLET | Freq: Every day | ORAL | 11 refills | Status: DC
Start: 2016-03-24 — End: 2016-06-12

## 2016-03-24 MED ORDER — ENBREL SURECLICK 50 MG/ML (1 ML) SUBCUTANEOUS PEN INJECTOR
50 mg/mL (1 mL) | SUBCUTANEOUS | 3 refills | Status: DC
Start: 2016-03-24 — End: 2016-04-03

## 2016-03-24 MED ORDER — ENBREL SURECLICK 50 MG/ML (1 ML) SUBCUTANEOUS PEN INJECTOR
50 mg/mL (1 mL) | SUBCUTANEOUS | 3 refills | Status: DC
Start: 2016-03-24 — End: 2016-03-24

## 2016-03-24 NOTE — Progress Notes (Signed)
RHEUMATOLOGY PROBLEM LIST AND CHIEF COMPLAINT  1. Psoriatic Arthritis (2015)- Psoriasis, episodic inflammatory arthritis, enthesitis, plantar fasciitis, dactylitis    Therapy History:  Prior DMARDs: Humira, Stelara (90mg  ?-02/2015),  Cosyntex (02/2015-12/2015)  Current DMARDs: Enbrel (past noncompliant, 02/2016 - current)    2. Gout - history of flares, elevated uric acid  most recent uric acid: 3.7 (07/25/2015); Allopurinol (03/2014-current)    INTERVAL HISTORY  This is a 55 y.o. Caucasian male.  Today, the patient complains of no pain in the joints.  Location: NA  Severity:  0 on a scale of 0-10  Timing: none at this time   Duration:  3 months  Modifying factors: Enbrel  Context/Associated signs and symptoms: The patient is doing well. He denies joint pain and morning stiffness. He mentions an episode of joint swelling in his right knee a month ago but saw Orthopedics and received a steroid injection, which resolved the episode. He continues on Enbrel 50 mg weekly and Allopurinol 600 mg daily. There have been no adverse effects to the current medication. He continues regular follow up with Dermatology and states he received multiple steroid injections in his psoriatic rashes and they have improved greatly. He has no complaints regarding his gout and believes it is well-managed. He states that his previous response to Enbrel consisted of no psoriatic rashes and joint symptoms.      RHEUMATOLOGY REVIEW OF SYSTEMS   Positives as per history  Negatives as follows:  CONSTITUTlONAL:  Denies unexplained persistent fevers or weight change  RESPIRATORY:  No pleuritic pain, exertional dyspnea  CARDIOVASCULAR:  Denies chest pain  GASTRO:   Denies heartburn, abdominal pain, nausea, vomiting, diarrhea  SKIN:    Denies rash   MSK:    No morning stiffness >1 hour, persistent joint swelling, persistent joint pain    PAST MEDICAL HISTORY  Reviewed with patient, significant changes in medical history - no    PAST MEDICAL HISTORY   Reviewed with patient, significant changes in medical history - No    PHYSICAL EXAM  Blood pressure (!) 148/92, pulse 80, temperature 98.6 ??F (37 ??C), temperature source Oral, resp. rate 16, height 5\' 10"  (1.778 m), weight 222 lb (100.7 kg), SpO2 97 %.  GENERAL APPEARANCE: Well-nourished, no acute distress  EYES: No scleral erythema, conjunctival injection  ENT: No oral ulcer, parotid enlargement  NECK: No adenopathy, thyroid enlargement  CARDIOVASCULAR: Heart rhythm is regular. No murmur, rub, gallop  CHEST: Normal vesicular breath sounds. No wheezes, rales, pleural friction rubs  ABDOMINAL: The abdomen is soft and nontender. Bowel sounds are normal  EXTREMITIES: There is no evidence of clubbing, cyanosis, edema  SKIN: Psoriatic rash on b/l knees, abdomen, shins ~BSA 5%, left 2nd fingernail onycholysis (improvement)  MUSCULOSKELETAL: Negative Patrick's bilaterally  Upper extremities - Bilateral 5th finger bony prominence noted.  Lower extremities - Bony prominence over bilateral knees, left knee subtle effusion, mild warmth    LABS, RADIOLOGY AND PROCEDURES   Previous labs reviewed -Yes    ASSESSMENT  1. Psoriatic arthritis (Established problem -  Partial response) - The patient continues with Enbrel 50 mg weekly. On exam, his psoriatic rashes have improved  and his joints show minimal signs of active inflammation. I explained that Enbrel has not reached efficacy and he should expect improvement. I will refer him to Preventative Cardiology (Dr. Nelson Chimes) given his risk factors. He should continue with his medications and return in 3 months for a follow up. He should continue regular follow up with Dermatology.  2. Gout (Established problem -  Stable disease) - The patient's most recent uric acid was 3.7 (07/24/2015). The patient has not had any recent gout flares. He continues on allopurinol 600mg  daily. He should continue to use prednisone as an abortive measure for a gout flare and he should  call if he has a persistent flare. He should continue with his medications.   3. Drug therapy monitoring for toxicity (biologic) - CBC, BUN, Cr, AST, ALT and albumin every 4 months    PLAN  1. Allopurinol 600 mg daily, prednisone 5mg  for a flare  2. Enbrel 50 mg weekly   3. Referral to Preventative Cardiology (Dr. Nelson Chimes)  4. Return in 3 months    Kameisha Malicki M. Allena Katz, MD  Adult and Pediatric Rheumatology     [facility] [place]  [address], [place], [place] [address], Phone [phone], Fax [phone]     Visiting Assistant Professor of Pediatrics    Department of Pediatrics, [facility]   [address], [place], [place] [address], Phone [phone], Fax [phone]    There are no Patient Instructions on file for this visit.    cc:  Quintin Alto, MD  Nuala Alpha, MD (Derm) phone: [phone]  [phone]    Written by Everette Rank, scribe, as dictated by Neomia Dear. Allena Katz, M.D.

## 2016-04-03 MED ORDER — ENBREL SURECLICK 50 MG/ML (1 ML) SUBCUTANEOUS PEN INJECTOR
50 mg/mL (1 mL) | SUBCUTANEOUS | 3 refills | Status: AC
Start: 2016-04-03 — End: 2016-07-02

## 2016-04-29 MED ORDER — ROSUVASTATIN 10 MG TAB
10 mg | ORAL_TABLET | ORAL | 0 refills | Status: DC
Start: 2016-04-29 — End: 2016-05-27

## 2016-05-27 MED ORDER — ROSUVASTATIN 10 MG TAB
10 mg | ORAL_TABLET | ORAL | 0 refills | Status: DC
Start: 2016-05-27 — End: 2016-07-25

## 2016-06-02 MED ORDER — ROSUVASTATIN 10 MG TAB
10 mg | ORAL_TABLET | ORAL | 0 refills | Status: DC
Start: 2016-06-02 — End: 2016-07-25

## 2016-06-12 ENCOUNTER — Ambulatory Visit: Admit: 2016-06-12 | Discharge: 2016-06-12 | Attending: Rheumatology | Primary: Family Medicine

## 2016-06-12 ENCOUNTER — Encounter: Admit: 2016-06-12 | Discharge: 2016-06-12 | Attending: Family Medicine | Primary: Family Medicine

## 2016-06-12 DIAGNOSIS — L4050 Arthropathic psoriasis, unspecified: Secondary | ICD-10-CM

## 2016-06-12 MED ORDER — SULFASALAZINE 500 MG TAB, DELAYED RELEASE
500 mg | ORAL_TABLET | Freq: Two times a day (BID) | ORAL | 6 refills | Status: DC
Start: 2016-06-12 — End: 2016-08-26

## 2016-06-12 MED ORDER — PREDNISONE 5 MG TAB
5 mg | ORAL_TABLET | ORAL | 1 refills | Status: DC
Start: 2016-06-12 — End: 2016-08-26

## 2016-06-12 MED ORDER — ALLOPURINOL 300 MG TAB
300 mg | ORAL_TABLET | Freq: Every day | ORAL | 6 refills | Status: AC
Start: 2016-06-12 — End: 2016-07-12

## 2016-06-12 NOTE — Progress Notes (Signed)
RHEUMATOLOGY PROBLEM LIST AND CHIEF COMPLAINT  1. Psoriatic Arthritis (2015)- Psoriasis since 80s, episodic inflammatory arthritis, enthesitis, plantar fasciitis, dactylitis    Therapy History:  Prior DMARDs: Humira, Stelara ('90mg'$  ?-02/2015),  Cosyntex (02/2015-12/2015)  Current DMARDs: Enbrel (past noncompliant, 02/2016 - current), Sulfasalazine (ordered 06/2016)    2. Gout - history of flares, elevated uric acid  most recent uric acid: 3.7 (07/25/2015); Allopurinol (03/2014-current)    INTERVAL HISTORY  This is a 55 y.o. Caucasian male.  Today, the patient complains of pain in the joints.  Location: NA  Severity:  0 on a scale of 0-10  Timing: all day   Duration:  3 months  Context/Associated signs and symptoms: The patient has been taking Enbrel 50 mg q6 days instead of q7 days and mentions minimal improvement of his psoriasis. He complains of new lesions over his right ankle. He states previous steroid injections provided some relief for his psoriasis but complains of a "divot" in his leg. He has no joint complaints and denies a recent gout flare. He continues with Allopurinol 600 mg daily.  He has not seen Cardiology .      RHEUMATOLOGY REVIEW OF SYSTEMS   Positives as per history  Negatives as follows:  CONSTITUTlONAL:  Denies unexplained persistent fevers or weight change  RESPIRATORY:  No pleuritic pain, exertional dyspnea  CARDIOVASCULAR:  Denies chest pain  GASTRO:   Denies heartburn, abdominal pain, nausea, vomiting, diarrhea  SKIN:    Denies rash   MSK:    No morning stiffness >1 hour, persistent joint swelling, persistent joint pain    PAST MEDICAL HISTORY  Reviewed with patient, significant changes in medical history - no    PAST MEDICAL HISTORY  Reviewed with patient, significant changes in medical history - No    PHYSICAL EXAM  Blood pressure 144/86, pulse 67, temperature 97.8 ??F (36.6 ??C), temperature source Oral, resp. rate 16, height '5\' 10"'$  (1.778 m), weight 228 lb 9.6 oz (103.7 kg), SpO2 97 %.   GENERAL APPEARANCE: Well-nourished, no acute distress  EYES: No scleral erythema, conjunctival injection  ENT: No oral ulcer, parotid enlargement  NECK: No adenopathy, thyroid enlargement  CARDIOVASCULAR: Heart rhythm is regular. No murmur, rub, gallop  CHEST: Normal vesicular breath sounds. No wheezes, rales, pleural friction rubs  ABDOMINAL: The abdomen is soft and nontender. Bowel sounds are normal  EXTREMITIES: There is no evidence of clubbing, cyanosis, edema  SKIN: Psoriatic rash on b/l knees, abdomen, shins ~BSA 4-5%, left 2nd fingernail onycholysis (improvement)  MUSCULOSKELETAL: Negative Patrick's bilaterally  Upper extremities - Bilateral 5th finger bony prominence noted.  Lower extremities - Bony prominence over bilateral knees    Psoriasis, LEs. 12/72017      LABS, RADIOLOGY AND PROCEDURES   Previous labs reviewed -Yes    ASSESSMENT  1. Psoriatic arthritis (Established problem -  Partial response) - The patient's psoriasis has improved minimally with Enbrel q6 days but he continues to have new lesions. His joints are normal on exam. Enbrel is not enough to manage his symptoms. I want him to start Sulfasalazine 500 mg BID. If he is unable to tolerate this medication or if his symptoms do not improve, I will most likely start him on Taltz. He should continue with Enbrel 50 mg weekly and return in 3 months for a follow up. I will check labs and refer him to Cardiology.   2. Gout (Established problem -  Stable disease) - The patient's most recent uric acid was 3.7 (07/24/2015). The  patient has not had any recent gout flares. He continues on allopurinol 675m daily. He should continue to use prednisone as an abortive measure for a gout flare and he should call if he has a persistent flare. He should continue with his medications.   3. Drug therapy monitoring for toxicity (biologic) - CBC, BUN, Cr, AST, ALT and albumin every 4 months  4. New medication - Sulfasalzine - A written summary, as prepared by the  [employer] was provided. The patient was given the opportunity to ask questions, and verbalized understanding of the following: The most common side effect reported is nausea and adominal pain (33% of patients).  These both improve with time.  Rash and HA can also occur but are less common.  Abnormal LFT's, mouth sores and pruritis are even rarer but can occur.  We encourage the use of sunblock due to burning or skin damage that can occur with the medication.  Orange skin and urine can occur but is harmless.  Blood count abnormalities can occur (low WBC leading to more infections) and anemia in those with G6PD deficiency.  This drug is safe in pregnancy but does decrease levels of folate which is important for pregnancy.  It should not be used during lactation.          PLAN  1. Allopurinol 600 mg daily, prednisone 542mfor a flare  2. Enbrel 50 mg weekly   3. Start Sulfasalazine 500 mg BID; consider Taltz if ineffective  4. Check CBC, CMP, markers of inflammation (ESR, CRP)  5. Referral to Preventative Cardiology (Dr. AmReesa Chew 6. Return in 3 months    Tarra Pence M. PaPosey ProntoMD  Adult and Pediatric Rheumatology     [facility] [place] [address]Phone [phone] [phone] E-mail: [email or web address]    Visiting Assistant Professor of Pediatrics    Department of Pediatrics, [facility] [place] [address]Phone [phone] [phone]-mail: [email or web address]    There are no Patient Instructions on file for this visit.    cc:  AnTruddie CrumbleMD  KiBillie RuddyMD (Derm) phone: [phone]  Written by JuMerlyn Lotscribe, as dictated by AaMikal PlanePaPosey ProntoM.D.

## 2016-06-13 LAB — METABOLIC PANEL, COMPREHENSIVE
A-G Ratio: 2.4 — ABNORMAL HIGH (ref 1.2–2.2)
ALT (SGPT): 45 IU/L — ABNORMAL HIGH (ref 0–44)
AST (SGOT): 33 IU/L (ref 0–40)
Albumin: 5.1 g/dL (ref 3.5–5.5)
Alk. phosphatase: 39 IU/L (ref 39–117)
BUN/Creatinine ratio: 14 (ref 9–20)
BUN: 14 mg/dL (ref 6–24)
Bilirubin, total: 0.5 mg/dL (ref 0.0–1.2)
CO2: 28 mmol/L (ref 18–29)
Calcium: 10.1 mg/dL (ref 8.7–10.2)
Chloride: 99 mmol/L (ref 96–106)
Creatinine: 1.02 mg/dL (ref 0.76–1.27)
GFR est AA: 94 mL/min/{1.73_m2} (ref >59)
GFR est non-AA: 81 mL/min/{1.73_m2} (ref >59)
GLOBULIN, TOTAL: 2.1 g/dL (ref 1.5–4.5)
Glucose: 104 mg/dL — ABNORMAL HIGH (ref 65–99)
Potassium: 4.6 mmol/L (ref 3.5–5.2)
Protein, total: 7.2 g/dL (ref 6.0–8.5)
Sodium: 142 mmol/L (ref 134–144)

## 2016-06-13 LAB — CBC+PLATELET+HEM REVIEW
ABS. BASOPHILS: 0 10*3/uL (ref 0.0–0.2)
ABS. EOSINOPHILS: 0.1 10*3/uL (ref 0.0–0.4)
ABS. LYMPHOCYTES: 1.9 10*3/uL (ref 0.7–3.1)
ABS. MONOCYTES: 0.5 10*3/uL (ref 0.1–0.9)
ABS. NEUTROPHILS: 2.1 10*3/uL (ref 1.4–7.0)
BASOPHILS: 1 %
EOSINOPHILS: 3 %
HCT: 46 % (ref 37.5–51.0)
HGB: 15.6 g/dL (ref 13.0–17.7)
LYMPHOCYTES: 41 %
MCH: 30.2 pg (ref 26.6–33.0)
MCHC: 33.9 g/dL (ref 31.5–35.7)
MCV: 89 fL (ref 79–97)
MONOCYTES: 11 %
NEUTROPHILS: 44 %
PLATELET COMMENT: ADEQUATE
PLATELET: 297 10*3/uL (ref 150–379)
RBC COMMENT: NORMAL
RBC: 5.17 x10E6/uL (ref 4.14–5.80)
RDW: 14.7 % (ref 12.3–15.4)
WBC: 4.7 10*3/uL (ref 3.4–10.8)

## 2016-06-13 LAB — HCV AB W/RFLX TO NAA
HCV AB, 144035: <0.1 s/co ratio (ref 0.0–0.9)
HCV Ab: <0.1 s/co ratio (ref 0.0–0.9)

## 2016-06-13 LAB — SED RATE (ESR): Sed rate (ESR): 2 mm/hr (ref 0–30)

## 2016-06-13 LAB — HEPATITIS B SURF AB QUANT
HEPATITIS B SURF AB QUANT, 006531: 104.3 m[IU]/mL (ref >9.9)
Hepatitis B surf Ab, QT: 104.3 m[IU]/mL (ref >9.9)

## 2016-06-13 LAB — HEPATITIS B CORE AB, TOTAL: Hep B Core Ab, total: NEGATIVE

## 2016-06-13 LAB — HEP B SURFACE AG: Hep B surface Ag screen: NEGATIVE

## 2016-06-13 LAB — C REACTIVE PROTEIN, QT: C-Reactive Protein, Qt: 1.2 mg/L (ref 0.0–4.9)

## 2016-06-13 LAB — INTERPRETATION

## 2016-06-13 LAB — URIC ACID: Uric acid: 3.7 mg/dL (ref 3.7–8.6)

## 2016-06-13 LAB — HEPATITIS B CORE ANTIBODY, TOTAL: Hep B Core Total Ab: NEGATIVE

## 2016-06-17 LAB — QUANTIFERON IN TUBE REFL
QFT TB Ag minus Nil Value: 0.02 IU/mL
QuantiFERON Mitogen Value: 5.26 IU/mL
QuantiFERON Nil Value: 0.05 IU/mL
QuantiFERON TB Ag Value: 0.07 IU/mL
QuantiFERON TB Gold: NEGATIVE

## 2016-06-17 LAB — QUANTIFERON TB GOLD

## 2016-07-22 MED ORDER — IXEKIZUMAB 80 MG/ML SUBCUTANEOUS AUTO-INJECTOR
80 mg/mL | INJECTION | SUBCUTANEOUS | 6 refills | Status: DC
Start: 2016-07-22 — End: 2016-07-22

## 2016-07-22 MED ORDER — IXEKIZUMAB 80 MG/ML SUBCUTANEOUS AUTO-INJECTOR
80 mg/mL | INJECTION | SUBCUTANEOUS | 6 refills | Status: DC
Start: 2016-07-22 — End: 2016-08-04

## 2016-07-25 MED ORDER — ROSUVASTATIN 10 MG TAB
10 mg | ORAL_TABLET | ORAL | 0 refills | Status: DC
Start: 2016-07-25 — End: 2016-08-23

## 2016-07-25 NOTE — Telephone Encounter (Signed)
Patient needs to use [facility] for [employer]. [facility] fax [phone], phone # [phone].

## 2016-07-26 MED ORDER — ALLOPURINOL 100 MG TAB
100 mg | ORAL_TABLET | ORAL | 10 refills | Status: DC
Start: 2016-07-26 — End: 2016-12-10

## 2016-08-01 MED ORDER — IXEKIZUMAB 80 MG/ML SUBCUTANEOUS AUTO-INJECTOR
80 mg/mL | INJECTION | SUBCUTANEOUS | 6 refills | Status: DC
Start: 2016-08-01 — End: 2016-08-04

## 2016-08-04 MED ORDER — IXEKIZUMAB 80 MG/ML SUBCUTANEOUS AUTO-INJECTOR
80 mg/mL | INJECTION | SUBCUTANEOUS | 6 refills | Status: DC
Start: 2016-08-04 — End: 2016-09-24

## 2016-08-05 NOTE — Telephone Encounter (Signed)
Faxed printed prescription for [employer] [facility] [phone], due to new insurance cannot e scribe to [facility] per faxed message received. Mailed patient assistance form to patient for Taltz. Patient needs to sign and return back to the office.

## 2016-08-11 ENCOUNTER — Encounter: Attending: Family Medicine | Primary: Family Medicine

## 2016-08-25 MED ORDER — ROSUVASTATIN 10 MG TAB
10 mg | ORAL_TABLET | ORAL | 0 refills | Status: DC
Start: 2016-08-25 — End: 2016-09-20

## 2016-08-26 ENCOUNTER — Ambulatory Visit
Admit: 2016-08-26 | Discharge: 2016-08-26 | Payer: PRIVATE HEALTH INSURANCE | Attending: Family Medicine | Primary: Family Medicine

## 2016-08-26 DIAGNOSIS — Z0000 Encounter for general adult medical examination without abnormal findings: Secondary | ICD-10-CM

## 2016-08-26 NOTE — Progress Notes (Signed)
Patient here for cpe, fasting labs.     1. Have you been to the ER, urgent care clinic since your last visit?  Hospitalized since your last visit?No    2. Have you seen or consulted any other health care providers outside of the [facility] since your last visit?  Include any pap smears or colon screening. No   Complete Physical Exam  Pre-Visit Questions:    1.  Do you follow a low fat diet?  no  2.  Are you up to date on your Tdap (<10 years)?  yes  3.  Have you ever had a Pneumovax vaccine?  no  4.  Do you follow an exercise program?  no  5.  Do you smoke?  no  6.  Do you consider yourself overweight?  yes  7.  Do you Testicular self exam?  yes  8.  Is there a family history of CAD< age 58?  no  9.  Is there a family history of Cancer?  no  10.  Do you know your Cancer risks?  no  11.  Have you had a colonoscopy?  Yes 08/30/2015 colonoscopy  Dr. Helmut Muster     Chief Complaint   Patient presents with   ??? Complete Physical     fasting labs, testosterone, uric acid,      he is a 56 y.o. year old male who presents for evalution.    Reviewed PmHx, RxHx, FmHx, SocHx, AllgHx and updated and dated in the chart.    Patient Active Problem List    Diagnosis   ??? Diverticulosis of large intestine without hemorrhage   ??? Gout   ??? Hypogonadism male   ??? Psoriasis   ??? Mixed hyperlipidemia       Review of Systems - negative except as listed above in the HPI    Objective:     Vitals:    08/26/16 0822   BP: 136/90   Pulse: 88   Resp: 18   Temp: 98.2 ??F (36.8 ??C)   SpO2: 97%   Weight: 212 lb (96.2 kg)   Height: 5\' 10"  (1.778 m)     Physical Examination: General appearance - alert, well appearing, and in no distress  Neck - supple, no significant adenopathy  Chest - clear to auscultation, no wheezes, rales or rhonchi, symmetric air entry  Heart - normal rate, regular rhythm, normal S1, S2, no murmurs, rubs, clicks or gallops  Abdomen - soft, nontender, nondistended, no masses or organomegaly   Extremities - peripheral pulses normal, no pedal edema, no clubbing or cyanosis    Assessment/ Plan:   Diagnoses and all orders for this visit:    1. Routine general medical examination at a health care facility  -     LIPID PANEL  -     METABOLIC PANEL, COMPREHENSIVE  -     CBC WITH AUTOMATED DIFF   -     PROSTATE SPECIFIC AG  -     TSH 3RD GENERATION    2. Diverticulosis of large intestine without hemorrhage  -was hospitalized for 4 days in Jan    3. Mixed hyperlipidemia  -     LIPID PANEL  -     METABOLIC PANEL, COMPREHENSIVE    4. Hypogonadism male  -stable    5. Gout, unspecified cause, unspecified chronicity, unspecified site  -     URIC ACID       -Patient is in good health  -Discussed  with patient cancer risk factors and screens needed  -Colonoscopy was recommended based on current guidelines for screening.  -Labs from previous visits were discussed with patient yes  -Discussed with patient diet and exercise  -Immunizations appropriate for age were discussed with pt and updated  -Follow-up Disposition:  Return in about 1 year (around 08/26/2017).    I have discussed the diagnosis with the patient and the intended plan as seen in the above orders.  The patient understands and agrees with the plan.  The patient has received an after-visit summary and questions were answered concerning future plans.     Medication Side Effects and Warnings were discussed with patient  Patient Labs were reviewed and or requested  Patient Past Records were reviewed and or requested     There are no Patient Instructions on file for this visit.        Danley DankerAndrew Jerzi Tigert, M.D.

## 2016-08-27 LAB — PSA, DIAGNOSTIC (PROSTATE SPECIFIC AG): Prostate Specific Ag: 0.7 ng/mL (ref 0.0–4.0)

## 2016-08-27 LAB — CBC WITH AUTOMATED DIFF
ABS. BASOPHILS: 0 10*3/uL (ref 0.0–0.2)
ABS. EOSINOPHILS: 0.1 10*3/uL (ref 0.0–0.4)
ABS. IMM. GRANS.: 0 10*3/uL (ref 0.0–0.1)
ABS. MONOCYTES: 0.7 10*3/uL (ref 0.1–0.9)
ABS. NEUTROPHILS: 2.9 10*3/uL (ref 1.4–7.0)
Abs Lymphocytes: 1.2 10*3/uL (ref 0.7–3.1)
BASOPHILS: 0 %
EOSINOPHILS: 2 %
HCT: 45.8 % (ref 37.5–51.0)
HGB: 14.9 g/dL (ref 13.0–17.7)
IMMATURE GRANULOCYTES: 0 %
Lymphocytes: 24 %
MCH: 29.9 pg (ref 26.6–33.0)
MCHC: 32.5 g/dL (ref 31.5–35.7)
MCV: 92 fL (ref 79–97)
MONOCYTES: 15 %
NEUTROPHILS: 59 %
PLATELET: 290 10*3/uL (ref 150–379)
RBC: 4.99 x10E6/uL (ref 4.14–5.80)
RDW: 14.6 % (ref 12.3–15.4)
WBC: 4.9 10*3/uL (ref 3.4–10.8)

## 2016-08-27 LAB — METABOLIC PANEL, COMPREHENSIVE
A-G Ratio: 2.2 (ref 1.2–2.2)
ALT (SGPT): 42 IU/L (ref 0–44)
AST (SGOT): 34 IU/L (ref 0–40)
Albumin: 4.6 g/dL (ref 3.5–5.5)
Alk. phosphatase: 40 IU/L (ref 39–117)
BUN/Creatinine ratio: 11 (ref 9–20)
BUN: 10 mg/dL (ref 6–24)
Bilirubin, total: 0.5 mg/dL (ref 0.0–1.2)
CO2: 23 mmol/L (ref 18–29)
Calcium: 9.7 mg/dL (ref 8.7–10.2)
Chloride: 98 mmol/L (ref 96–106)
Creatinine: 0.91 mg/dL (ref 0.76–1.27)
GFR est AA: 108 (ref >59)
GFR est non-AA: 93 (ref >59)
GLOBULIN, TOTAL: 2.1 (ref 1.5–4.5)
Glucose: 95 mg/dL (ref 65–99)
Potassium: 4.5 mmol/L (ref 3.5–5.2)
Protein, total: 6.7 g/dL (ref 6.0–8.5)
Sodium: 143 mmol/L (ref 134–144)

## 2016-08-27 LAB — LIPID PANEL
Cholesterol, total: 161 mg/dL (ref 100–199)
HDL Cholesterol: 44 mg/dL (ref >39)
LDL, calculated: 98 (ref 0–99)
Triglyceride: 95 mg/dL (ref 0–149)
VLDL, calculated: 19 (ref 5–40)

## 2016-08-27 LAB — TSH 3RD GENERATION: TSH: 1.1 u[IU]/mL (ref 0.450–4.500)

## 2016-08-27 LAB — URIC ACID: Uric acid: 4.8 mg/dL (ref 3.7–8.6)

## 2016-08-27 LAB — CVD REPORT

## 2016-08-29 HISTORY — PX: COLONOSCOPY: SHX174

## 2016-09-03 NOTE — Telephone Encounter (Signed)
Faxed Patient Assistance form to [employer], and sent fax for update on prescription to [facility]. Have not received a denial or approval from the insurance company.

## 2016-09-11 ENCOUNTER — Encounter: Attending: Rheumatology | Primary: Family Medicine

## 2016-09-11 ENCOUNTER — Ambulatory Visit
Admit: 2016-09-11 | Discharge: 2016-09-11 | Payer: PRIVATE HEALTH INSURANCE | Attending: Rheumatology | Primary: Family Medicine

## 2016-09-11 DIAGNOSIS — L4050 Arthropathic psoriasis, unspecified: Secondary | ICD-10-CM

## 2016-09-11 MED ORDER — DESOXIMETASONE 0.05 % TOPICAL CREAM
0.05 % | Freq: Two times a day (BID) | CUTANEOUS | 0 refills | Status: AC
Start: 2016-09-11 — End: ?

## 2016-09-11 NOTE — Progress Notes (Signed)
RHEUMATOLOGY PROBLEM LIST AND CHIEF COMPLAINT  1. Psoriatic Arthritis (2015)- Psoriasis since 80s, episodic inflammatory arthritis, enthesitis, plantar fasciitis, dactylitis    Therapy History:  Prior DMARDs: Humira, Stelara (90mg  ?-02/2015),  Cosyntex (02/2015-12/2015), Enbrel (past noncompliant, 02/2016 - 07/2016), Sulfasalazine (06/2016 - 07/2016)  Current DMARDs: Taltz (ordered)    2. Gout - history of flares, elevated uric acid  most recent uric acid: 3.7 (07/25/2015); Allopurinol (03/2014-current)    INTERVAL HISTORY  This is a 56 y.o. Caucasian male.  Today, the patient complains of no pain in the joints.  Location: NA  Severity:  0 on a scale of 0-10  Timing: all day   Duration:  3 months  Context/Associated signs and symptoms: The patient stopped Enbrel and sulfasalazine in January for an episode of diverticulitis. He complains of a worsening rash but denies any joint symptoms. He did not restart his medications because he is waiting for Taltz. He admits to taking Allopurinol 450 mg daily instead of 600 mg as prescribed; he states he likes to "experiment with medicine in order take the least amount needed." He denies any recent gout flares.      RHEUMATOLOGY REVIEW OF SYSTEMS   Positives as per history  Negatives as follows:  CONSTITUTlONAL:  Denies unexplained persistent fevers or weight change  RESPIRATORY:  No pleuritic pain, exertional dyspnea  CARDIOVASCULAR:  Denies chest pain  GASTRO:   Denies heartburn, abdominal pain, nausea, vomiting, diarrhea  SKIN:    Denies rash   MSK:    No morning stiffness >1 hour, persistent joint swelling, persistent joint pain    PAST MEDICAL HISTORY  Reviewed with patient, significant changes in medical history - no    PAST MEDICAL HISTORY  Reviewed with patient, significant changes in medical history - No    PHYSICAL EXAM  Blood pressure 136/81, pulse 88, temperature 98 ??F (36.7 ??C), temperature source Oral, resp. rate 16, weight 212 lb 6.4 oz (96.3 kg), SpO2 98 %.   GENERAL APPEARANCE: Well-nourished, no acute distress  EYES: No scleral erythema, conjunctival injection  ENT: No oral ulcer, parotid enlargement  NECK: No adenopathy, thyroid enlargement  CARDIOVASCULAR: Heart rhythm is regular. No murmur, rub, gallop  CHEST: Normal vesicular breath sounds. No wheezes, rales, pleural friction rubs  ABDOMINAL: The abdomen is soft and nontender. Bowel sounds are normal  EXTREMITIES: There is no evidence of clubbing, cyanosis, edema  SKIN: Psoriatic rash on B/L knees, abdomen, shins, elbows, navel, buttocks ~BSA 6%, left 2nd fingernail onycholysis (improvement)  MUSCULOSKELETAL: Negative Patrick's bilaterally  Upper extremities - Bilateral 5th finger bony prominence noted.  Lower extremities - Bony prominence over bilateral knees    LABS, RADIOLOGY AND PROCEDURES   Previous labs reviewed -Yes    ASSESSMENT  1. Psoriatic arthritis (Established problem -  Partial response) - The patient's psoriasis has worsened; he is currently not on any medication for this. He is currently waiting for Taltz. For now, the patient should use topicals (topicort) PRN. He should return in 3 months for a follow up.   2. Gout (Established problem -  Stable disease) - The patient's most recent uric acid was 4.8 (08/08/2016). The patient has not had any recent gout flares despite decreasing his allopurinol to 450 mg daily. I explained that he has risk factors for heart disease and how importance of keeping this under control. For now, he may continue at this dose and we will recheck his uric acid in a few months. If it is 5+ at that  time, we will return to 600 mg daily.   3. Drug therapy monitoring for toxicity (biologic) - CBC, BUN, Cr, AST, ALT and albumin every 4 months      PLAN  1. Allopurinol 450 mg daily, prednisone 5mg  for a flare  2. Taltz  3. Topical Steroids  4. Check Uric Acid in a few months; if 5+ increase allopurinol  5. Return in 3 months    Jamee Pacholski M. Allena KatzPatel, MD  Adult and Pediatric Rheumatology      [facility]  [address], [place], [place] [address], Phone [phone], Fax [phone]   E-mail: [email or web address]    Visiting Assistant Professor of Pediatrics    Department of Pediatrics, [facility]   [id number], [place], [place] [id number], Phone [phone], Fax [phone]  E-mail: [email or web address]    There are no Patient Instructions on file for this visit.    cc:  Quintin AltoAndrew L Rose, MD  Nuala AlphaKim Panzarella, MD (Derm) phone: [phone]  [phone]    Written by Everette RankJustin Quion, scribe, as dictated by Neomia DearAarat M. Allena KatzPatel, M.D.    Total face-to face time was 40 minutes, greater than 50% of which was spent in counseling and coordination of care.  The diagnosis, treatment and various other items were discussed in detail: Test results, medication options, possible side effects, lifestyle changes.

## 2016-09-11 NOTE — Telephone Encounter (Signed)
Spoke with Ivor MessierCora at [facility]  [phone]), and she approved Taltz, good 07/13/2016 until 12/04/2016, and includes loading dose, use ID# for approval number  [id number]. Faxed [facility] [employer] [facility] This the pharmacy for [employer]. Left message for Beryl MeagerJulissa at[employer] Support Program regarding [employer] was approved. Patient informed [employer]tz was approved, and to call if he needs injection teaching. Patient is not on Enbrel per Dr. Allena KatzPatel.

## 2016-09-11 NOTE — Telephone Encounter (Signed)
Regan RakersJulissa from [employer] called to check on the patients prior auth. She said it can be sent threw [facility] and their number is [phone]. Regan RakersJulissa also request a call back regarding the prior auth at [phone] which is her direct number.

## 2016-09-22 MED ORDER — ROSUVASTATIN 10 MG TAB
10 mg | ORAL_TABLET | ORAL | 0 refills | Status: DC
Start: 2016-09-22 — End: 2016-10-20

## 2016-09-24 MED ORDER — IXEKIZUMAB 80 MG/ML SUBCUTANEOUS AUTO-INJECTOR
80 mg/mL | INJECTION | SUBCUTANEOUS | 6 refills | Status: DC
Start: 2016-09-24 — End: 2016-12-10

## 2016-10-02 NOTE — Telephone Encounter (Signed)
Called Patient Re: Prior authorization approval for medication Taltz. No answer left voice mail message

## 2016-10-16 NOTE — Telephone Encounter (Signed)
Returned call to Danita at [facility] that the pt is still taking the taltz, informed Danita to give the office a return call if she has any further questions

## 2016-10-16 NOTE — Telephone Encounter (Signed)
-----   Message from Caralyn Guile sent at 10/15/2016  2:43 PM EDT -----  Regarding: Dr. Ammie Dalton Joana Reamer  Danita with [facility] [phone]) called to get status if the patient has received, been taking or been seen by the Dr. For medication follow up for Rx"Taltz".

## 2016-10-20 MED ORDER — ROSUVASTATIN 10 MG TAB
10 mg | ORAL_TABLET | ORAL | 0 refills | Status: DC
Start: 2016-10-20 — End: 2016-11-19

## 2016-11-19 MED ORDER — ROSUVASTATIN 10 MG TAB
10 mg | ORAL_TABLET | ORAL | 0 refills | Status: DC
Start: 2016-11-19 — End: 2016-12-15

## 2016-12-10 ENCOUNTER — Ambulatory Visit
Admit: 2016-12-10 | Discharge: 2016-12-10 | Payer: PRIVATE HEALTH INSURANCE | Attending: Rheumatology | Primary: Family Medicine

## 2016-12-10 ENCOUNTER — Encounter
Admit: 2016-12-10 | Discharge: 2016-12-10 | Payer: PRIVATE HEALTH INSURANCE | Attending: Family Medicine | Primary: Family Medicine

## 2016-12-10 DIAGNOSIS — M109 Gout, unspecified: Secondary | ICD-10-CM

## 2016-12-10 MED ORDER — ALLOPURINOL 300 MG TAB
300 mg | ORAL_TABLET | Freq: Two times a day (BID) | ORAL | 11 refills | Status: DC
Start: 2016-12-10 — End: 2017-07-14

## 2016-12-10 MED ORDER — IXEKIZUMAB 80 MG/ML SUBCUTANEOUS AUTO-INJECTOR
80 mg/mL | INJECTION | SUBCUTANEOUS | 4 refills | Status: DC
Start: 2016-12-10 — End: 2016-12-17

## 2016-12-10 NOTE — Progress Notes (Signed)
RHEUMATOLOGY PROBLEM LIST AND CHIEF COMPLAINT  1. Psoriatic Arthritis (2015)- Psoriasis since 80s, episodic inflammatory arthritis, enthesitis, plantar fasciitis, dactylitis    Therapy History:  Prior DMARDs: Humira, Stelara (90mg  ?-02/2015),  Cosyntex (02/2015-12/2015), Enbrel (past noncompliant, 02/2016 - 07/2016), Sulfasalazine (06/2016 - 07/2016)  Current DMARDs: Altamease Oiler (10/2016 - current)    2. Gout - history of flares, elevated uric acid  most recent uric acid: 3.7 (07/25/2015); Allopurinol (03/2014-current)    INTERVAL HISTORY  This is a 56 y.o. Caucasian male.  Today, the patient complains of no pain in the joints.  Location: NA  Severity:  0 on a scale of 0-10  Timing: all day   Duration:  3 months  Context/Associated signs and symptoms:  The patient is doing well. 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 He continue with allopurinol 450 mg and denies any recent gout flares. He was evaluated by Cardiology (08/2016) and everything was normal with no concerns.     RHEUMATOLOGY REVIEW OF SYSTEMS   Positives as per history  Negatives as follows:  CONSTITUTlONAL:  Denies unexplained persistent fevers or weight change  RESPIRATORY:  No pleuritic pain, exertional dyspnea  CARDIOVASCULAR:  Denies chest pain  GASTRO:   Denies heartburn, abdominal pain, nausea, vomiting, diarrhea  SKIN:    Denies rash   MSK:    No morning stiffness >1 hour, persistent joint swelling, persistent joint pain    PAST MEDICAL HISTORY  Reviewed with patient, significant changes in medical history - no    PAST MEDICAL HISTORY  Reviewed with patient, significant changes in medical history - No    PHYSICAL EXAM  Blood pressure 146/78, pulse 84, temperature 98.2 ??F (36.8 ??C), temperature source Oral, resp. rate 16, weight 210 lb (95.3 kg), SpO2 96 %.  GENERAL APPEARANCE: Well-nourished, no acute distress  EYES: No scleral erythema, conjunctival injection  ENT: No oral ulcer, parotid enlargement   NECK: No adenopathy, thyroid enlargement  CARDIOVASCULAR: Heart rhythm is regular. No murmur, rub, gallop  CHEST: Normal vesicular breath sounds. No wheezes, rales, pleural friction rubs  ABDOMINAL: The abdomen is soft and nontender. Bowel sounds are normal  EXTREMITIES: There is no evidence of clubbing, cyanosis, edema  SKIN: Psoriatic rash over navel ~BSA 1%, left 2nd fingernail onycholysis (improvement). Postinflammatory hyperpigmentation.  MUSCULOSKELETAL: Negative Patrick's bilaterally  Upper extremities - Bilateral 5th finger bony prominence noted.  Lower extremities - Bony prominence over bilateral knees    LABS, RADIOLOGY AND PROCEDURES   Previous labs reviewed -Yes    Uric Acid 08/2016  4.8    ASSESSMENT  1. Psoriatic arthritis (Established problem -  Partial response) - The patient's psoriasis has improved going from 6% BSA to 1% BSA with Taltz. He should continue with medication and return in 3 months for a follow up.   2. Gout (Established problem -  Stable disease) - The patient's most recent uric acid was 4.8 (08/08/2016). This is increased from 3.7 on 06/2016. The patient has not had any recent gout flares so we will keep him on Allopurinol 450 mg daily for now. We will continue to monitor his uric acid.   3. Drug therapy monitoring for toxicity (biologic) - CBC, BUN, Cr, AST, ALT and albumin every 4 months    PLANds  1. Allopurinol 450 mg daily, prednisone 5mg  for a flare  2. Taltz  3. Topical Steroids  4. Check CBC, CMP, Uric Acid  5. Return in 3 months  Lazlo Tunney M. Allena KatzPatel, MD  Adult and Pediatric Rheumatology     [facility]  [address], [place], [place] [address], Phone [phone], Fax [phone]   E-mail: [email or web address]    Visiting Assistant Professor of Pediatrics    Department of Pediatrics, [facility]   [id number], [place], [place] [address], Phone [phone], Fax [phone]  E-mail: [email or web address]     There are no Patient Instructions on file for this visit.    cc:  Quintin AltoAndrew L Rose, MD  Nuala AlphaKim Panzarella, MD (Derm) phone: [phone]  [phone]    Written by Everette RankJustin Quion, scribe, as dictated by Neomia DearAarat M. Allena KatzPatel, M.D.

## 2016-12-12 LAB — CBC+PLATELET+HEM REVIEW
ABS. BASOPHILS: 0 10*3/uL (ref 0.0–0.2)
ABS. EOSINOPHILS: 0.1 10*3/uL (ref 0.0–0.4)
ABS. LYMPHOCYTES: 1.5 10*3/uL (ref 0.7–3.1)
ABS. MONOCYTES: 0.5 10*3/uL (ref 0.1–0.9)
ABS. NEUTROPHILS: 4.1 10*3/uL (ref 1.4–7.0)
BASOPHILS: 0 %
EOSINOPHILS: 2 %
HCT: 50.2 % (ref 37.5–51.0)
HGB: 16.7 g/dL (ref 13.0–17.7)
LYMPHOCYTES: 24 %
MCH: 30.3 pg (ref 26.6–33.0)
MCHC: 33.3 g/dL (ref 31.5–35.7)
MCV: 91 fL (ref 79–97)
MONOCYTES: 8 %
NEUTROPHILS: 66 %
PLATELET COMMENT: ADEQUATE
PLATELET: 300 10*3/uL (ref 150–379)
RBC COMMENT: NORMAL
RBC: 5.51 x10E6/uL (ref 4.14–5.80)
RDW: 14.5 % (ref 12.3–15.4)
WBC: 6.2 10*3/uL (ref 3.4–10.8)

## 2016-12-12 LAB — METABOLIC PANEL, COMPREHENSIVE
A-G Ratio: 2.1 (ref 1.2–2.2)
ALT (SGPT): 33 IU/L (ref 0–44)
AST (SGOT): 36 IU/L (ref 0–40)
Albumin: 4.8 g/dL (ref 3.5–5.5)
Alk. phosphatase: 38 IU/L — ABNORMAL LOW (ref 39–117)
BUN/Creatinine ratio: 14 (ref 9–20)
BUN: 14 mg/dL (ref 6–24)
Bilirubin, total: 0.6 mg/dL (ref 0.0–1.2)
CO2: 24 mmol/L (ref 18–29)
Calcium: 9.7 mg/dL (ref 8.7–10.2)
Chloride: 102 mmol/L (ref 96–106)
Creatinine: 0.99 mg/dL (ref 0.76–1.27)
GFR est AA: 97 mL/min/{1.73_m2} (ref >59)
GFR est non-AA: 84 mL/min/{1.73_m2} (ref >59)
GLOBULIN, TOTAL: 2.3 g/dL (ref 1.5–4.5)
Glucose: 96 mg/dL (ref 65–99)
Potassium: 4.4 mmol/L (ref 3.5–5.2)
Protein, total: 7.1 g/dL (ref 6.0–8.5)
Sodium: 143 mmol/L (ref 134–144)

## 2016-12-12 LAB — URIC ACID: Uric acid: 4.4 mg/dL (ref 3.7–8.6)

## 2016-12-15 MED ORDER — ROSUVASTATIN 10 MG TAB
10 mg | ORAL_TABLET | ORAL | 0 refills | Status: DC
Start: 2016-12-15 — End: 2017-01-12

## 2016-12-16 NOTE — Telephone Encounter (Signed)
Faxed prior auth request to [employer] for [employer] approval for [employer] Pet[employer]altz.

## 2016-12-17 MED ORDER — IXEKIZUMAB 80 MG/ML SUBCUTANEOUS AUTO-INJECTOR
80 mg/mL | INJECTION | SUBCUTANEOUS | 3 refills | Status: DC
Start: 2016-12-17 — End: 2017-08-25

## 2016-12-17 NOTE — Telephone Encounter (Signed)
Faxed prescription for Taltz injection to [facility] for FEP  Members.

## 2016-12-23 NOTE — Progress Notes (Signed)
Faxed 08/26/16 office notes/lab results to Dr Dr Ulice Boldobert Flynn [facility]. Fax #[phone] confirmation was received.

## 2017-01-12 MED ORDER — ROSUVASTATIN 10 MG TAB
10 mg | ORAL_TABLET | ORAL | 0 refills | Status: DC
Start: 2017-01-12 — End: 2017-02-19

## 2017-02-19 MED ORDER — ROSUVASTATIN 10 MG TAB
10 mg | ORAL_TABLET | ORAL | 0 refills | Status: DC
Start: 2017-02-19 — End: 2017-03-26

## 2017-03-11 ENCOUNTER — Ambulatory Visit
Admit: 2017-03-11 | Discharge: 2017-03-11 | Payer: PRIVATE HEALTH INSURANCE | Attending: Rheumatology | Primary: Family Medicine

## 2017-03-11 ENCOUNTER — Encounter: Attending: Rheumatology | Primary: Family Medicine

## 2017-03-11 DIAGNOSIS — M109 Gout, unspecified: Secondary | ICD-10-CM

## 2017-03-11 NOTE — Progress Notes (Signed)
RHEUMATOLOGY PROBLEM LIST AND CHIEF COMPLAINT1. Psoriatic Arthritis (2015)- Psoriasis since 80s, episodic inflammatory arthritis, enthesitis, plantar fasciitis, dactylitis    Therapy History:  Prior DMARDs: Humira, Stelara (90mg  ?-02/2015),  Cosyntex (02/2015-12/2015), Enbrel (past noncompliant, 02/2016 - 07/2016), Sulfasalazine (06/2016 - 07/2016)  Current DMARDs: Altamease Oileraltz (10/2016 - current)    2. Gout - history of flares, elevated uric acid  most recent uric acid: 4.4 (12/2016); Allopurinol (03/2014-current)  INTERVAL HISTORY  This is a 56 y.o. Caucasian male.  Today, the patient complains of no pain in the joints.  Location: NA  Severity:  0 on a scale of 0-10  Timing: all day   Duration:  3 months  Context/Associated signs and symptoms:  Patient states his symptoms have improved, and he has not had a gout flare since his last visit. He is currently taking Taltz 300 mg (self-tapered from 450 mg).      RHEUMATOLOGY REVIEW OF SYSTEMS   Positives as per history  Negatives as follows:  CONSTITUTlONAL:  Denies unexplained persistent fevers or weight change  RESPIRATORY:  No pleuritic pain, exertional dyspnea  CARDIOVASCULAR:  Denies chest pain  GASTRO:   Denies heartburn, abdominal pain, nausea, vomiting, diarrhea  SKIN:    Denies rash   MSK:    No morning stiffness >1 hour, persistent joint swelling, persistent joint pain    PAST MEDICAL HISTORY  Reviewed with patient, significant changes in medical history - no  PAST MEDICAL HISTORY  Reviewed with patient, significant changes in medical history - No  PHYSICAL EXAM  Blood pressure 133/86, pulse 71, temperature 98.3 ??F (36.8 ??C), temperature source Oral, resp. rate 16, weight 222 lb 3.2 oz (100.8 kg), SpO2 97 %.  GENERAL APPEARANCE: Well-nourished, no acute distress  EYES: No scleral erythema, conjunctival injection  ENT: No oral ulcer, parotid enlargement  NECK: No adenopathy, thyroid enlargement  CARDIOVASCULAR: Heart rhythm is regular. No murmur, rub, gallop   CHEST: Normal vesicular breath sounds. No wheezes, rales, pleural friction rubs  ABDOMINAL: The abdomen is soft and nontender. Bowel sounds are normal  EXTREMITIES: There is no evidence of clubbing, cyanosis, edema  SKIN: Psoriatic rash over navel ~BSA 1%, left 2nd fingernail onycholysis (improvement). Postinflammatory hyperpigmentation.  MUSCULOSKELETAL: Negative Patrick's bilaterally  Upper extremities - Bilateral 5th finger bony prominence noted.  Lower extremities - Bony prominence over bilateral knees    LABS, RADIOLOGY AND PROCEDURES   Previous labs reviewed -Yes  Uric Acid 12/2016  4.4    Uric Acid 2/20184.8    ASSESSMENT1. Psoriatic arthritis (Established problem -  Partial response) - Patient is normal on exam today. He has not flared since his last visit. He should continue with Altamease Oileraltz and return in 3 months for a follow up.   2. Gout (Established problem -  Stable disease) - The patient's most recent uric acid was 4.4 (12/2016). Patient is normal on exam today. He self-tapered Allopurinol from 450 mg to 300 mg daily. I am fine with him continuing Allopurinol 300 mg. We will continue to monitor his uric acid.  3. Drug therapy monitoring for toxicity (biologic) - CBC, BUN, Cr, AST, ALT and albumin every 4 months  PLANds  1. Allopurinol 300 mg daily  2. Taltz  3. Return in 3 months    Byrd Terrero M. Allena KatzPatel, MD  Adult and Pediatric Rheumatology     [facility]  [address], [place], [place] [address], Phone [phone], Fax [phone]   E-mail: [email or web address]    Visiting Assistant Professor of Pediatrics  Department of Pediatrics, [facility]   [phone], [place], [place] [address], Phone [phone], Fax [phone]  E-mail: [email or web address]    There are no Patient Instructions on file for this visit.    cc:  Quintin Alto, MD   Nuala Alpha, MD (Derm) phone: [phone]  [phone]     Written by Carmie End, scribe, as dictated by Neomia Dear. Allena Katz, M.D.

## 2017-03-12 ENCOUNTER — Encounter

## 2017-03-12 MED ORDER — TESTOSTERONE 30 MG/ACTUATION (1.5 ML) TRANSDERM SOLUTION METERED PUMP
30 mg/actuation (1.5 mL) | Freq: Every day | TRANSDERMAL | 5 refills | Status: AC
Start: 2017-03-12 — End: ?

## 2017-03-12 NOTE — Telephone Encounter (Signed)
Called in

## 2017-03-12 NOTE — Telephone Encounter (Signed)
From: Meyer Coryan M Wehrenberg  To: Quintin AltoAndrew L Kimesha Claxton, MD  Sent: 03/12/2017 2:39 PM EDT  Subject:  Medication Renewal Request    Original  authorizing provider: Quintin AltoAndrew L Rider Ermis, MD    Meyer Coryan  M. Grinage would like a refill of the following medications:  testosterone  (AXIRON) 30 mg/actuation (1.5 mL) slpm Quintin Alto[[employer] L Jessup Ogas, MD]    [facility] [address] [facility] [place], [place] - [id number]    Comment:  Hello  Dr [name], May I request a refill of the 30 mg Axiron please . The product i had last time was called Actives, Gel 1% NDC [id number]. Thank You Binnie

## 2017-03-13 NOTE — Telephone Encounter (Signed)
[facility] pharmacy called in and needs clarification on the Axiron.  Phone [phone]

## 2017-03-16 NOTE — Telephone Encounter (Signed)
Pharmacy [facility] called, verbal order for Axion given as ordered.

## 2017-03-16 NOTE — Telephone Encounter (Signed)
.  nurse

## 2017-03-26 MED ORDER — ROSUVASTATIN 10 MG TAB
10 mg | ORAL_TABLET | ORAL | 0 refills | Status: DC
Start: 2017-03-26 — End: 2017-04-27

## 2017-04-07 NOTE — Progress Notes (Signed)
PA approved for testosterone 30mg /act solution from 03/08/17 thru 04/07/18,copy faxed to pharmacy and my chart message to pt of approval.

## 2017-04-27 MED ORDER — ROSUVASTATIN 10 MG TAB
10 mg | ORAL_TABLET | ORAL | 0 refills | Status: DC
Start: 2017-04-27 — End: 2017-05-26

## 2017-05-26 MED ORDER — ROSUVASTATIN 10 MG TAB
10 mg | ORAL_TABLET | ORAL | 0 refills | Status: DC
Start: 2017-05-26 — End: 2017-06-21

## 2017-06-22 MED ORDER — ROSUVASTATIN 10 MG TAB
10 mg | ORAL_TABLET | ORAL | 0 refills | Status: DC
Start: 2017-06-22 — End: 2017-07-29

## 2017-07-14 ENCOUNTER — Ambulatory Visit
Admit: 2017-07-14 | Discharge: 2017-07-14 | Payer: PRIVATE HEALTH INSURANCE | Attending: Rheumatology | Primary: Family Medicine

## 2017-07-14 ENCOUNTER — Encounter
Admit: 2017-07-14 | Discharge: 2017-07-14 | Payer: PRIVATE HEALTH INSURANCE | Attending: Family Medicine | Primary: Family Medicine

## 2017-07-14 ENCOUNTER — Ambulatory Visit: Attending: Rheumatology

## 2017-07-14 DIAGNOSIS — L4050 Arthropathic psoriasis, unspecified: Secondary | ICD-10-CM

## 2017-07-14 MED ORDER — ALLOPURINOL 300 MG TAB
300 mg | ORAL_TABLET | Freq: Every day | ORAL | 6 refills | Status: AC
Start: 2017-07-14 — End: ?

## 2017-07-14 NOTE — Progress Notes (Signed)
RHEUMATOLOGY PROBLEM LIST AND CHIEF COMPLAINT1. Psoriatic Arthritis (2015)- Psoriasis since 80s, episodic inflammatory arthritis, enthesitis, plantar fasciitis, dactylitis,    Therapy History:  Prior DMARDs: Humira, Stelara (90mg  ?-02/2015),  Cosyntex (02/2015-12/2015), Enbrel (past noncompliant, 02/2016 - 07/2016), Sulfasalazine (06/2016 - 07/2016)  Current DMARDs: Altamease Oiler (10/2016 - current)    TB  (Q-gold) test: 06/2016 - negative    2. Gout - history of flares, elevated uric acid  most recent uric acid: 5.7 (07/2016), Allopurinol (03/2014-current)  INTERVAL HISTORY  This is a 57 y.o. Caucasian male.  Today, the patient complains of no pain in the joints.  Location: knee  Severity:  2 on a scale of 0-10  Timing: all day   Duration:  4 months  Context/Associated signs and symptoms:  The patient is doing well today regarding his gout. He is currently taking Allopurinol 300 mg daily. He continues with Runner, broadcasting/film/video. He complains of swelling in his left knee, which worsened significantly after bending over and painting a fence. His knee is stiff for minutes and improves with activity. Steroid injection in the knee per Orthopedics provided relief for months at a time.      RHEUMATOLOGY REVIEW OF SYSTEMS   Positives as per history  Negatives as follows:  CONSTITUTlONAL:  Denies unexplained persistent fevers or weight change  HEAD/EYES:   Denies eye redness, blurry vision or sudden loss of vision, dry eyes, HA, temporal artery pain  ENT:    Denies oral/nasal ulcers, recurrent sinus infections, dry mouth  RESPIRATORY:  No pleuritic pain, exertional dyspnea  CARDIOVASCULAR:  Denies chest pain  GASTRO:   Denies heartburn, abdominal pain, nausea, vomiting, diarrhea  HEMATOLOGIC:  No easy bruising, purpura, swollen lymph nodes  SKIN:    Denies rash  VASCULAR:   Denies edema, cyanosis, raynaud phenomenon  NEUROLOGIC:  Denies specific muscle weakness, paresthesias   PSYCHIATRIC:  No sleep disturbance / snoring, depression, anxiety   MSK:    No morning stiffness >1 hour, persistent joint swelling    PAST MEDICAL HISTORY  Reviewed with patient, significant changes in medical history - No  PHYSICAL EXAM  Blood pressure (!) 138/92, pulse 76, temperature 98.1 ??F (36.7 ??C), temperature source Oral, resp. rate 20, height 5\' 10"  (1.778 m), weight 222 lb (100.7 kg), SpO2 92 %.  GENERAL APPEARANCE: Well-nourished, no acute distress  EYES: No scleral erythema, conjunctival injection  ENT: No oral ulcer, parotid enlargement  NECK: No adenopathy, thyroid enlargement  CARDIOVASCULAR: Heart rhythm is regular. No murmur, rub, gallop  CHEST: Normal vesicular breath sounds. No wheezes, rales, pleural friction rubs  ABDOMINAL: The abdomen is soft and nontender. Bowel sounds are normal  EXTREMITIES: There is no evidence of clubbing, cyanosis, edema  SKIN: Psoriatic rash over navel ~BSA 1%, left 2nd fingernail onycholysis (improvement). Postinflammatory hyperpigmentation.  MUSCULOSKELETAL: Negative Patrick's bilaterally  Upper extremities - Bilateral 5th finger bony prominence noted.  Lower extremities - Bony prominence, crepitus over bilateral knees. L knee very subtle warmth, subtle swelling.    LABS, RADIOLOGY AND PROCEDURES   Previous labs reviewed -Yes    Uric acid 07/2017  5.7  Uric Acid 12/2016  4.4  Uric Acid 2/20184.8    ASSESSMENT1. Psoriatic arthritis (Established problem -  Partial response) - Patient is normal on exam today apart from left knee swelling secondary to OA. He should continue with Altamease Oiler and return in 4 months for a follow up. We discussed symptomatic treatment for the knee, including Tylenol for pain.  2. Gout (Established problem -  Stable disease) - The patient's uric acid check today was 5.7. He should continue with Allopurinol 300 mg daily. We will continue to monitor his uric acid.  3. Drug therapy monitoring for toxicity (biologic) - CBC, BUN, Cr, AST, ALT and albumin every 4 months    PLANds  1. Allopurinol 300 mg daily  2. Taltz   3. Check CBC, CMP, Uric Acid, TB   4. Return in 4 months    Makenzye Troutman M. Allena KatzPatel, MD  Adult and Pediatric Rheumatology     [facility]  [address], [place], [place] [address], Phone [phone], Fax [phone]   E-mail: [email or web address]    Visiting Assistant Professor of Pediatrics    Department of Pediatrics, [facility]   [id number], [place], [place] [address], Phone [phone], Fax [phone]  E-mail: [email or web address]    There are no Patient Instructions on file for this visit.    cc:  Quintin Altoose, Andrew L, MD   Nuala AlphaKim Panzarella, MD (Derm) phone: [phone]  [phone]    Written by Carmie Endohan Sharma, scribe, as dictated by Neomia DearAarat M. Allena KatzPatel, M.D.

## 2017-07-16 LAB — CBC+PLATELET+HEM REVIEW
ABS. BASOPHILS: 0 10*3/uL (ref 0.0–0.2)
ABS. EOSINOPHILS: 0.1 10*3/uL (ref 0.0–0.4)
ABS. LYMPHOCYTES: 1.9 10*3/uL (ref 0.7–3.1)
ABS. MONOCYTES: 0.7 10*3/uL (ref 0.1–0.9)
ABS. NEUTROPHILS: 3.2 10*3/uL (ref 1.4–7.0)
BASOPHILS: 0 %
EOSINOPHILS: 2 %
HCT: 48.3 % (ref 37.5–51.0)
HGB: 16.1 g/dL (ref 13.0–17.7)
LYMPHOCYTES: 32 %
MCH: 30.1 pg (ref 26.6–33.0)
MCHC: 33.3 g/dL (ref 31.5–35.7)
MCV: 90 fL (ref 79–97)
MONOCYTES: 12 %
NEUTROPHILS: 54 %
PLATELET COMMENT: ADEQUATE
PLATELET: 305 10*3/uL (ref 150–379)
RBC COMMENT: NORMAL
RBC: 5.35 x10E6/uL (ref 4.14–5.80)
RDW: 14.2 % (ref 12.3–15.4)
WBC: 5.9 10*3/uL (ref 3.4–10.8)

## 2017-07-16 LAB — METABOLIC PANEL, COMPREHENSIVE
A-G Ratio: 1.9 (ref 1.2–2.2)
ALT (SGPT): 34 IU/L (ref 0–44)
AST (SGOT): 29 IU/L (ref 0–40)
Albumin: 4.8 g/dL (ref 3.5–5.5)
Alk. phosphatase: 34 IU/L — ABNORMAL LOW (ref 39–117)
BUN/Creatinine ratio: 15 (ref 9–20)
BUN: 15 mg/dL (ref 6–24)
Bilirubin, total: 0.5 mg/dL (ref 0.0–1.2)
CO2: 26 mmol/L (ref 20–29)
Calcium: 9.8 mg/dL (ref 8.7–10.2)
Chloride: 102 mmol/L (ref 96–106)
Creatinine: 0.99 mg/dL (ref 0.76–1.27)
GFR est AA: 97 mL/min/{1.73_m2} (ref >59)
GFR est non-AA: 84 mL/min/{1.73_m2} (ref >59)
GLOBULIN, TOTAL: 2.5 g/dL (ref 1.5–4.5)
Glucose: 111 mg/dL — ABNORMAL HIGH (ref 65–99)
Potassium: 4.4 mmol/L (ref 3.5–5.2)
Protein, total: 7.3 g/dL (ref 6.0–8.5)
Sodium: 143 mmol/L (ref 134–144)

## 2017-07-16 LAB — URIC ACID: Uric acid: 5.7 mg/dL (ref 3.7–8.6)

## 2017-07-17 LAB — QUANTIFERON-TB GOLD PLUS
QuantiFERON Mitogen Value: >10 IU/mL
QuantiFERON Nil Value: 0.02 IU/mL
QuantiFERON Plus: NEGATIVE
QuantiFERON TB1 Ag: 0.03 IU/mL
QuantiFERON TB2 Ag: 0.02 IU/mL

## 2017-07-29 MED ORDER — ROSUVASTATIN 10 MG TAB
10 mg | ORAL_TABLET | ORAL | 0 refills | Status: DC
Start: 2017-07-29 — End: 2017-08-31

## 2017-08-25 MED ORDER — IXEKIZUMAB 80 MG/ML SUBCUTANEOUS AUTO-INJECTOR
80 mg/mL | INJECTION | SUBCUTANEOUS | 3 refills | Status: AC
Start: 2017-08-25 — End: ?

## 2017-08-25 NOTE — Telephone Encounter (Signed)
Last visit 07/14/17

## 2017-08-31 MED ORDER — ROSUVASTATIN 10 MG TAB
10 mg | ORAL_TABLET | ORAL | 0 refills | Status: DC
Start: 2017-08-31 — End: 2017-10-02

## 2017-10-02 MED ORDER — ROSUVASTATIN 10 MG TAB
10 mg | ORAL_TABLET | ORAL | 0 refills | Status: DC
Start: 2017-10-02 — End: 2017-10-05

## 2017-10-02 NOTE — Telephone Encounter (Signed)
Pt needs an appointment for labs

## 2017-10-05 ENCOUNTER — Encounter

## 2017-10-05 MED ORDER — ROSUVASTATIN 10 MG TAB
10 mg | ORAL_TABLET | ORAL | 0 refills | Status: AC
Start: 2017-10-05 — End: ?

## 2017-10-29 NOTE — Progress Notes (Signed)
Dr Danella Sensingyler Zenner Request for medical records was faxed on 10/27/17 to [employer] [phone] to be processed

## 2017-11-04 ENCOUNTER — Encounter: Attending: Rheumatology | Primary: Family Medicine

## 2017-12-29 NOTE — Telephone Encounter (Signed)
[place] move to [place] and is in between appointments and he will be in town on January 11, 2018 and he is asking if he could have a sample of Taltz 80mg  pen.  His phone is [phone].

## 2018-01-13 NOTE — Telephone Encounter (Signed)
Returned call left voicemail informing per Dr Allena KatzPatel that he will try to reach out to the taltz rep to get a sample but Altamease Oileraltz is not a sample that we keep in the office

## 2018-01-19 NOTE — Telephone Encounter (Signed)
[name], [facility], phone" [phone], Has questions about patients past medication for Taltz. Patient moved to [place] and this is needed for a  prior authorization.

## 2018-01-19 NOTE — Telephone Encounter (Signed)
Christina calling from Dr Nickolas MadridNewnihoff office in [place] regarding the medical records that they received from us due to pt is a new pt with them now. She has a question regarding those records. Please call her back at [phone].

## 2018-01-20 NOTE — Telephone Encounter (Signed)
[facility], and spoke with the receptionist, and she requested records on the patient. Informed will send the last office note, and labs. Fax # [phone].

## 2018-01-20 NOTE — Telephone Encounter (Signed)
PT information was handled by Dr.Patel office

## 2019-10-06 ENCOUNTER — Ambulatory Visit: Payer: Self-pay

## 2019-10-06 ENCOUNTER — Ambulatory Visit: Payer: Self-pay | Attending: Internal Medicine

## 2019-10-06 DIAGNOSIS — Z23 Encounter for immunization: Secondary | ICD-10-CM

## 2019-10-06 NOTE — Progress Notes (Signed)
   Covid-19 Vaccination Clinic  Name:  Jeffrey Klein    MRN: [id number] DOB: 05/07/1961  10/06/2019  [name] was observed post Covid-19 immunization for 15 minutes without incident. He was provided with Vaccine Information Sheet and instruction to access the V-Safe system.   [name] was instructed to call 911 with any severe reactions post vaccine: Marland Kitchen Difficulty breathing  . Swelling of face and throat  . A fast heartbeat  . A bad rash all over body  . Dizziness and weakness   Immunizations Administered    Name Date Dose VIS Date Route   Pfizer COVID-19 Vaccine 10/06/2019  1:35 PM 0.3 mL 06/17/2019 Intramuscular   Manufacturer: [employer], [employer]   Lot: [id number]   [place]: [id number]

## 2019-10-31 ENCOUNTER — Ambulatory Visit: Payer: Self-pay | Attending: Internal Medicine

## 2019-10-31 DIAGNOSIS — Z23 Encounter for immunization: Secondary | ICD-10-CM

## 2019-10-31 NOTE — Progress Notes (Signed)
   Covid-19 Vaccination Clinic  Name:  Jeffrey Klein    MRN: [id number] DOB: 08/22/1960  10/31/2019  [name] was observed post Covid-19 immunization for 15 minutes without incident. He was provided with Vaccine Information Sheet and instruction to access the V-Safe system.   [name] was instructed to call 911 with any severe reactions post vaccine: Marland Kitchen Difficulty breathing  . Swelling of face and throat  . A fast heartbeat  . A bad rash all over body  . Dizziness and weakness   Immunizations Administered    Name Date Dose VIS Date Route   Pfizer COVID-19 Vaccine 10/31/2019 10:04 AM 0.3 mL 08/31/2018 Intramuscular   Manufacturer: [place]   Lot: [id number]   [place]: [id number]

## 2019-11-17 ENCOUNTER — Other Ambulatory Visit: Payer: Self-pay

## 2019-11-18 ENCOUNTER — Ambulatory Visit (INDEPENDENT_AMBULATORY_CARE_PROVIDER_SITE_OTHER): Payer: 59 | Admitting: Family Medicine

## 2019-11-18 ENCOUNTER — Encounter: Payer: Self-pay | Admitting: Family Medicine

## 2019-11-18 VITALS — BP 130/82 | HR 96 | Temp 98.2°F | Ht 70.08 in | Wt 220.0 lb

## 2019-11-18 DIAGNOSIS — Z0000 Encounter for general adult medical examination without abnormal findings: Secondary | ICD-10-CM

## 2019-11-18 DIAGNOSIS — E782 Mixed hyperlipidemia: Secondary | ICD-10-CM | POA: Diagnosis not present

## 2019-11-18 DIAGNOSIS — R7989 Other specified abnormal findings of blood chemistry: Secondary | ICD-10-CM | POA: Diagnosis not present

## 2019-11-18 DIAGNOSIS — Z125 Encounter for screening for malignant neoplasm of prostate: Secondary | ICD-10-CM | POA: Diagnosis not present

## 2019-11-18 DIAGNOSIS — L4050 Arthropathic psoriasis, unspecified: Secondary | ICD-10-CM

## 2019-11-18 DIAGNOSIS — E785 Hyperlipidemia, unspecified: Secondary | ICD-10-CM | POA: Insufficient documentation

## 2019-11-18 DIAGNOSIS — Z1159 Encounter for screening for other viral diseases: Secondary | ICD-10-CM

## 2019-11-18 DIAGNOSIS — K5790 Diverticulosis of intestine, part unspecified, without perforation or abscess without bleeding: Secondary | ICD-10-CM | POA: Insufficient documentation

## 2019-11-18 DIAGNOSIS — M1000 Idiopathic gout, unspecified site: Secondary | ICD-10-CM | POA: Diagnosis not present

## 2019-11-18 DIAGNOSIS — Z114 Encounter for screening for human immunodeficiency virus [HIV]: Secondary | ICD-10-CM

## 2019-11-18 DIAGNOSIS — M109 Gout, unspecified: Secondary | ICD-10-CM | POA: Insufficient documentation

## 2019-11-18 LAB — COMPREHENSIVE METABOLIC PANEL
ALT: 57 U/L — ABNORMAL HIGH (ref 0–53)
AST: 39 U/L — ABNORMAL HIGH (ref 0–37)
Albumin: 5.2 g/dL (ref 3.5–5.2)
Alkaline Phosphatase: 37 U/L — ABNORMAL LOW (ref 39–117)
BUN: 13 mg/dL (ref 6–23)
CO2: 30 mEq/L (ref 19–32)
Calcium: 10.2 mg/dL (ref 8.4–10.5)
Chloride: 102 mEq/L (ref 96–112)
Creatinine, Ser: 1 mg/dL (ref 0.40–1.50)
GFR: 76.1 mL/min (ref >60.00)
Glucose, Bld: 97 mg/dL (ref 70–99)
Potassium: 4.4 mEq/L (ref 3.5–5.1)
Sodium: 140 mEq/L (ref 135–145)
Total Bilirubin: 0.6 mg/dL (ref 0.2–1.2)
Total Protein: 7.6 g/dL (ref 6.0–8.3)

## 2019-11-18 LAB — CBC WITH DIFFERENTIAL/PLATELET
Basophils Absolute: 0 10*3/uL (ref 0.0–0.1)
Basophils Relative: 0.6 % (ref 0.0–3.0)
Eosinophils Absolute: 0.2 10*3/uL (ref 0.0–0.7)
Eosinophils Relative: 2.7 % (ref 0.0–5.0)
HCT: 51 % (ref 39.0–52.0)
Hemoglobin: 16.9 g/dL (ref 13.0–17.0)
Lymphocytes Relative: 28.3 % (ref 12.0–46.0)
Lymphs Abs: 1.8 10*3/uL (ref 0.7–4.0)
MCHC: 33.1 g/dL (ref 30.0–36.0)
MCV: 91.3 fl (ref 78.0–100.0)
Monocytes Absolute: 0.6 10*3/uL (ref 0.1–1.0)
Monocytes Relative: 9.6 % (ref 3.0–12.0)
Neutro Abs: 3.7 10*3/uL (ref 1.4–7.7)
Neutrophils Relative %: 58.8 % (ref 43.0–77.0)
Platelets: 307 10*3/uL (ref 150.0–400.0)
RBC: 5.59 Mil/uL (ref 4.22–5.81)
RDW: 14.4 % (ref 11.5–15.5)
WBC: 6.3 10*3/uL (ref 4.0–10.5)

## 2019-11-18 LAB — LIPID PANEL
Cholesterol: 190 mg/dL (ref 0–200)
HDL: 61.3 mg/dL (ref >39.00)
LDL Cholesterol: 105 mg/dL — ABNORMAL HIGH (ref 0–99)
NonHDL: 128.82
Total CHOL/HDL Ratio: 3
Triglycerides: 119 mg/dL (ref 0.0–149.0)
VLDL: 23.8 mg/dL (ref 0.0–40.0)

## 2019-11-18 LAB — PSA: PSA: 1.1 ng/mL (ref 0.10–4.00)

## 2019-11-18 LAB — URIC ACID: Uric Acid, Serum: 5.6 mg/dL (ref 4.0–7.8)

## 2019-11-18 LAB — TESTOSTERONE: Testosterone: 631.22 ng/dL (ref 300.00–890.00)

## 2019-11-18 LAB — TSH: TSH: 1.42 u[IU]/mL (ref 0.35–4.50)

## 2019-11-18 MED ORDER — ALLOPURINOL 100 MG PO TABS: 100.0000 mg | ORAL_TABLET | Freq: Every day | ORAL | 1 refills | Status: DC

## 2019-11-18 NOTE — Progress Notes (Signed)
Patient: Jeffrey Klein MRN: [id number] DOB: 07/07/1961 PCP: Orma Flaming, MD     Subjective:  Chief Complaint  Patient presents with  . Annual Exam  . Gout  . Hyperlipidemia    HPI: The patient is a 59 y.o. male who presents today for annual exam. He denies any changes to past medical history. There have been no recent hospitalizations. They are not following a well balanced diet and exercise plan. Weight has been fluctuating a bit. No complaints today. He has past medical history significant for gout, psoriatic arthritis and hyperlipidemia. Also has low testosterone.   No family hx of colon or breast cancer.  Immunization History  Administered Date(s) Administered  . PFIZER SARS-COV-2 Vaccination 10/06/2019, 10/31/2019  . Zoster Recombinat (Shingrix) 04/05/2019   Colonoscopy: 2018. Unsure how long for follow up.  PSA: today   Review of Systems  Constitutional: Negative for chills, fatigue and fever.  HENT: Negative for congestion, dental problem, ear pain, hearing loss, sore throat and trouble swallowing.   Eyes: Negative for visual disturbance.  Respiratory: Negative for cough, chest tightness, shortness of breath and wheezing.   Cardiovascular: Negative for chest pain, palpitations and leg swelling.  Gastrointestinal: Negative for abdominal pain, blood in stool, diarrhea and nausea.  Endocrine: Negative for cold intolerance, polydipsia, polyphagia and polyuria.  Genitourinary: Negative for dysuria, frequency, hematuria, testicular pain and urgency.  Musculoskeletal: Negative for arthralgias.  Skin: Negative for rash.  Neurological: Negative for dizziness, light-headedness and headaches.  Psychiatric/Behavioral: Negative for dysphoric mood and sleep disturbance. The patient is not nervous/anxious.     Allergies Patient has No Known Allergies.  Past Medical History Patient  has no past medical history on file.  Surgical History Patient  has no past surgical  history on file.  Family History Pateint's family history is not on file.  Social History Patient  reports that he has never smoked. He has never used smokeless tobacco. He reports current alcohol use of about 5.0 standard drinks of alcohol per week. He reports that he does not use drugs.    Objective:  Vitals:   11/18/19 0817  BP: 130/82  Pulse: 96  Temp: 98.2 F (36.8 C)  TempSrc: Temporal  SpO2: 96%  Weight: 220 lb (99.8 kg)  Height: 5' 10.08" (1.78 m)    Body mass index is 31.5 kg/m.  Physical Exam Vitals reviewed.  Constitutional:      Appearance: Normal appearance. He is well-developed.  HENT:     Head: Normocephalic and atraumatic.     Right Ear: Tympanic membrane, ear canal and external ear normal.     Left Ear: Tympanic membrane, ear canal and external ear normal.     Nose: Nose normal.     Mouth/Throat:     Mouth: Mucous membranes are moist.  Eyes:     Extraocular Movements: Extraocular movements intact.     Conjunctiva/sclera: Conjunctivae normal.     Pupils: Pupils are equal, round, and reactive to light.  Neck:     Thyroid: No thyromegaly.     Vascular: No carotid bruit.  Cardiovascular:     Rate and Rhythm: Normal rate and regular rhythm.     Pulses: Normal pulses.     Heart sounds: Normal heart sounds. No murmur.  Pulmonary:     Effort: Pulmonary effort is normal.     Breath sounds: Normal breath sounds.  Abdominal:     General: Bowel sounds are normal. There is no distension.     Palpations: Abdomen is  soft.     Tenderness: There is no abdominal tenderness.  Musculoskeletal:     Cervical back: Normal range of motion and neck supple.     Comments: herbeden nodes on multiple DIP of hands   Lymphadenopathy:     Cervical: No cervical adenopathy.  Skin:    General: Skin is warm and dry.     Capillary Refill: Capillary refill takes less than 2 seconds.     Findings: No rash.  Neurological:     General: No focal deficit present.     Mental  Status: He is alert and oriented to person, place, and time.     Cranial Nerves: No cranial nerve deficit.     Coordination: Coordination normal.     Deep Tendon Reflexes: Reflexes normal.  Psychiatric:        Mood and Affect: Mood normal.        Behavior: Behavior normal.      Office Visit from 11/18/2019 in [facility]  PHQ-2 Total Score  0         Assessment/plan: 1. Annual physical exam Routine fasting labs today. HM reviewed and requesting records for his last cscope/shingles vaccine. Needs tdap, but has not been 4 weeks since last covid vaccination. Encouraged more exercise into lifestyle. Otherwise doing quite well. F/u in one year or as needed.  Patient counseling [x]    Nutrition: Stressed importance of moderation in sodium/caffeine intake, saturated fat and cholesterol, caloric balance, sufficient intake of fresh fruits, vegetables, fiber, calcium, iron, and 1 mg of folate supplement per day (for females capable of pregnancy).  [x]    Stressed the importance of regular exercise.   []    Substance Abuse: Discussed cessation/primary prevention of tobacco, alcohol, or other drug use; driving or other dangerous activities under the influence; availability of treatment for abuse.   [x]    Injury prevention: Discussed safety belts, safety helmets, smoke detector, smoking near bedding or upholstery.   [x]    Sexuality: Discussed sexually transmitted diseases, partner selection, use of condoms, avoidance of unintended pregnancy  and contraceptive alternatives.  [x]    Dental health: Discussed importance of regular tooth brushing, flossing, and dental visits.  [x]    Health maintenance and immunizations reviewed. Please refer to Health maintenance section.    - CBC with Differential/Platelet - Comprehensive metabolic panel - TSH  2. Acute idiopathic gout, unspecified site Unsure if he truly has gout. Nodes are typical of OA and hx of psoriatic arthritis makes me  wonder if gout was not a true diagnosis. He never had an elevated uric acid he states. Doesn't really take his 300mg  allopurinol. We are going to drop this down to 100mg /day. Advised he ask Dr. Estanislado Pandy if he really even needs this drug.  - Uric acid  3. Hyperlipidemia, mixed No refills needed on his crestor. Does not smoke. No hx of HTN. No previous lipid panels to review.  - Lipid panel  4. Low testosterone  - Testosterone  5. Screening for prostate cancer  - PSA  6. Encounter for screening for HIV  - HIV Antibody (routine testing w rflx)  7. Encounter for hepatitis C screening test for low risk patient  - Hepatitis C antibody  8. Psoriatic arthritis  -referral to rheumatology for continued px of taltz. Does not need refill at this time.  -well controlled.   This visit occurred during the SARS-CoV-2 public health emergency.  Safety protocols were in place, including screening questions prior to the visit, additional usage of  staff PPE, and extensive cleaning of exam room while observing appropriate contact time as indicated for disinfecting solutions.     Return in about 1 year (around 11/17/2020).     Orma Flaming, MD [place]  11/18/2019

## 2019-11-18 NOTE — Patient Instructions (Addendum)
-need your last colonoscopy record.  -need your tdap. Just wait 4 weeks after your last covid shot. Can come here or go to pharmacy.  -get last shingles shot date for me.   So nice to meet you! We are going to decrease your gout medication down to 123m/day. Let me know if you have any flairs.   -referred you to rheumatology for your psoriatic arthritis as well. I wonder if you don't even have gout.   All of your routine lab work. You are good for a year!   Dr. WRogers Blocker    Preventive Care [id number] Old, Male Preventive care refers to lifestyle choices and visits with your health care provider that can promote health and wellness. This includes:  A yearly physical exam. This is also called an annual well check.  Regular dental and eye exams.  Immunizations.  Screening for certain conditions.  Healthy lifestyle choices, such as eating a healthy diet, getting regular exercise, not using drugs or products that contain nicotine and tobacco, and limiting alcohol use. What can I expect for my preventive care visit? Physical exam Your health care provider will check:  Height and weight. These may be used to calculate body mass index (BMI), which is a measurement that tells if you are at a healthy weight.  Heart rate and blood pressure.  Your skin for abnormal spots. Counseling Your health care provider may ask you questions about:  Alcohol, tobacco, and drug use.  Emotional well-being.  Home and relationship well-being.  Sexual activity.  Eating habits.  Work and work eStatistician What immunizations do I need?  Influenza (flu) vaccine  This is recommended every year. Tetanus, diphtheria, and pertussis (Tdap) vaccine  You may need a Td booster every 10 years. Varicella (chickenpox) vaccine  You may need this vaccine if you have not already been vaccinated. Zoster (shingles) vaccine  You may need this after age 44280 Measles, mumps, and rubella (MMR) vaccine  You  may need at least one dose of MMR if you were born in 1957 or later. You may also need a second dose. Pneumococcal conjugate (PCV13) vaccine  You may need this if you have certain conditions and were not previously vaccinated. Pneumococcal polysaccharide (PPSV23) vaccine  You may need one or two doses if you smoke cigarettes or if you have certain conditions. Meningococcal conjugate (MenACWY) vaccine  You may need this if you have certain conditions. Hepatitis A vaccine  You may need this if you have certain conditions or if you travel or work in places where you may be exposed to hepatitis A. Hepatitis B vaccine  You may need this if you have certain conditions or if you travel or work in places where you may be exposed to hepatitis B. Haemophilus influenzae type b (Hib) vaccine  You may need this if you have certain risk factors. Human papillomavirus (HPV) vaccine  If recommended by your health care provider, you may need three doses over 6 months. You may receive vaccines as individual doses or as more than one vaccine together in one shot (combination vaccines). Talk with your health care provider about the risks and benefits of combination vaccines. What tests do I need? Blood tests  Lipid and cholesterol levels. These may be checked every 5 years, or more frequently if you are over 517years old.  Hepatitis C test.  Hepatitis B test. Screening  Lung cancer screening. You may have this screening every year starting at age 4453if you  have a 30-pack-year history of smoking and currently smoke or have quit within the past 15 years.  Prostate cancer screening. Recommendations will vary depending on your family history and other risks.  Colorectal cancer screening. All adults should have this screening starting at age 57 and continuing until age 19. Your health care provider may recommend screening at age 76 if you are at increased risk. You will have tests every 1-10 years,  depending on your results and the type of screening test.  Diabetes screening. This is done by checking your blood sugar (glucose) after you have not eaten for a while (fasting). You may have this done every 1-3 years.  Sexually transmitted disease (STD) testing. Follow these instructions at home: Eating and drinking  Eat a diet that includes fresh fruits and vegetables, whole grains, lean protein, and low-fat dairy products.  Take vitamin and mineral supplements as recommended by your health care provider.  Do not drink alcohol if your health care provider tells you not to drink.  If you drink alcohol: ? Limit how much you have to 0-2 drinks a day. ? Be aware of how much alcohol is in your drink. In the [place] one drink equals one 12 oz bottle of beer (355 mL), one 5 oz glass of wine (148 mL), or one 1 oz glass of hard liquor (44 mL). Lifestyle  Take daily care of your teeth and gums.  Stay active. Exercise for at least 30 minutes on 5 or more days each week.  Do not use any products that contain nicotine or tobacco, such as cigarettes, e-cigarettes, and chewing tobacco. If you need help quitting, ask your health care provider.  If you are sexually active, practice safe sex. Use a condom or other form of protection to prevent STIs (sexually transmitted infections).  Talk with your health care provider about taking a low-dose aspirin every day starting at age 71. What's next?  Go to your health care provider once a year for a well check visit.  Ask your health care provider how often you should have your eyes and teeth checked.  Stay up to date on all vaccines. This information is not intended to replace advice given to you by your health care provider. Make sure you discuss any questions you have with your health care provider. Document Revised: 06/17/2018 Document Reviewed: 06/17/2018 Elsevier Patient Education  2020 [employer].

## 2019-11-21 LAB — HEPATITIS C ANTIBODY
Hepatitis C Ab: NONREACTIVE
SIGNAL TO CUT-OFF: 0.01 (ref <1.00)

## 2019-11-21 LAB — HIV ANTIBODY (ROUTINE TESTING W REFLEX): HIV 1&2 Ab, 4th Generation: NONREACTIVE

## 2020-01-31 ENCOUNTER — Encounter: Payer: Self-pay | Admitting: Family Medicine

## 2020-02-01 ENCOUNTER — Other Ambulatory Visit: Payer: Self-pay

## 2020-02-06 MED ORDER — TESTOSTERONE 12.5 MG/ACT (1%) TD GEL: 50.0000 mg | Freq: Every day | TRANSDERMAL | 1 refills | Status: DC

## 2020-02-06 NOTE — Addendum Note (Signed)
Addended by: Orma Flaming on: 02/06/2020 03:51 PM   Modules accepted: Orders

## 2020-02-10 ENCOUNTER — Other Ambulatory Visit: Payer: Self-pay | Admitting: Family Medicine

## 2020-02-10 MED ORDER — TESTOSTERONE 12.5 MG/ACT (1%) TD GEL: 50.0000 mg | Freq: Every day | TRANSDERMAL | 1 refills | Status: DC

## 2020-02-15 ENCOUNTER — Encounter: Payer: Self-pay | Admitting: Family Medicine

## 2020-02-16 ENCOUNTER — Other Ambulatory Visit: Payer: Self-pay

## 2020-02-16 MED ORDER — ROSUVASTATIN CALCIUM 10 MG PO TABS: 10.0000 mg | ORAL_TABLET | Freq: Every day | ORAL | 1 refills | Status: DC

## 2020-04-12 ENCOUNTER — Ambulatory Visit (INDEPENDENT_AMBULATORY_CARE_PROVIDER_SITE_OTHER): Payer: 59 | Admitting: Family Medicine

## 2020-04-12 ENCOUNTER — Other Ambulatory Visit: Payer: Self-pay

## 2020-04-12 ENCOUNTER — Encounter: Payer: Self-pay | Admitting: Family Medicine

## 2020-04-12 VITALS — BP 148/82 | HR 72 | Temp 98.0°F | Ht 70.0 in | Wt 220.2 lb

## 2020-04-12 DIAGNOSIS — R030 Elevated blood-pressure reading, without diagnosis of hypertension: Secondary | ICD-10-CM | POA: Diagnosis not present

## 2020-04-12 DIAGNOSIS — F4321 Adjustment disorder with depressed mood: Secondary | ICD-10-CM | POA: Diagnosis not present

## 2020-04-12 DIAGNOSIS — R0982 Postnasal drip: Secondary | ICD-10-CM

## 2020-04-12 DIAGNOSIS — R42 Dizziness and giddiness: Secondary | ICD-10-CM | POA: Diagnosis not present

## 2020-04-12 MED ORDER — MECLIZINE HCL 25 MG PO TABS: 25.0000 mg | ORAL_TABLET | Freq: Three times a day (TID) | ORAL | 0 refills | Status: DC | PRN

## 2020-04-12 NOTE — Patient Instructions (Addendum)
-  start flonase over the counter at night to help with nasal congestion  -sent in meclizine to help for dizziness, labs today   -blood pressure is also high. Can contribute to dizziness.  Keep a log for me like 3-4x/week.   -I wonder if stress/trauma can be contributing as well.   -see you back in 3 months for blood pressure check, but I need you to call me/email me if dizziness continues. Will need to work this up more (brain imaging and ent referral)

## 2020-04-12 NOTE — Progress Notes (Signed)
Patient: Jeffrey Klein MRN: [id number] DOB: Dec 25, 1960 PCP: Orma Flaming, MD     Subjective:  Chief Complaint  Patient presents with   Nasal Congestion   Dizziness    HPI: The patient is a 59 y.o. male who presents today for nasal congestion starting 6-7 weeks ago and also dizziness.   A week ago on Sunday he woke up and was dizzy when he sat out of bed. he was sweaty and the dizziness passed after 30 minutes. He states it happened again yesterday morning. He had dry heaving with this episode. He believes it was with position change that he was dizzy. He states he was standing still and the room was spinning. Denies any dizziness with head movement. Denies any chest pain, palpitations, vision changes. Has intermittent tinnitus. 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 Also of note, he just buried one of his kids a week ago when this started from a drug overdose.   Nasal congestion/PND -has had 6-7 weeks. Has a lot of stuff running down his throat. He states he spit stuff up and it was yellow/green. He has not used anything for this. He thought it could be allergies. No tenderness in his sinuses or his teeth. He has no issues with breathing.    Review of Systems  Constitutional: Negative for chills and fever.  HENT: Positive for congestion and postnasal drip. Negative for sinus pressure, sinus pain and sore throat.   Eyes: Negative for visual disturbance.  Respiratory: Negative for cough, shortness of breath and wheezing.   Cardiovascular: Negative for chest pain and palpitations.  Gastrointestinal: Negative for abdominal pain, diarrhea, nausea and vomiting.  Neurological: Negative for dizziness, weakness, light-headedness and headaches.  Psychiatric/Behavioral: Negative for sleep disturbance and suicidal ideas.    Allergies Patient has No Known Allergies.  Past Medical History Patient  has no past medical history on file.  Surgical  History Patient  has no past surgical history on file.  Family History Pateint's family history is not on file.  Social History Patient  reports that he has never smoked. He has never used smokeless tobacco. He reports current alcohol use of about 5.0 standard drinks of alcohol per week. He reports that he does not use drugs.    Objective: Vitals:   04/12/20 1303  BP: (!) 148/82  Pulse: 72  Temp: 98 F (36.7 C)  TempSrc: Temporal  SpO2: 97%  Weight: 220 lb 3.2 oz (99.9 kg)  Height: 5\' 10"  (1.778 m)    Body mass index is 31.6 kg/m.  Physical Exam Vitals reviewed.  Constitutional:      Appearance: Normal appearance. He is normal weight.  HENT:     Head: Normocephalic and atraumatic.     Comments: No ttp over his sinuses     Right Ear: Tympanic membrane, ear canal and external ear normal.     Left Ear: Tympanic membrane, ear canal and external ear normal.     Mouth/Throat:     Mouth: Mucous membranes are moist.     Comments: Can not visualize posterior pharynx due to tongue/resistence/gag.  Eyes:     Extraocular Movements: Extraocular movements intact.     Conjunctiva/sclera: Conjunctivae normal.     Pupils: Pupils are equal, round, and reactive to light.  Cardiovascular:     Rate and Rhythm: Normal rate and regular rhythm.     Heart sounds: Normal heart sounds.  Pulmonary:     Effort: Pulmonary effort is normal.  Breath sounds: Normal breath sounds.  Abdominal:     General: Bowel sounds are normal.     Palpations: Abdomen is soft.  Musculoskeletal:     Cervical back: Normal range of motion and neck supple.  Lymphadenopathy:     Cervical: No cervical adenopathy.  Skin:    General: Skin is warm.     Capillary Refill: Capillary refill takes less than 2 seconds.  Neurological:     General: No focal deficit present.     Mental Status: He is alert and oriented to person, place, and time.     Comments: Negative dix halpike bilaterally   Psychiatric:        Mood  and Affect: Mood normal.        Behavior: Behavior normal.      Office Visit from 04/12/2020 in [place] [facility]  PHQ-2 Total Score 2     othostatic vitals WNL     Assessment/plan: 1. Dizziness -clinically not consistent with BPPV. Orthostatic vitals WNL. Not indicative of vestibular neuritis. Did discuss possible mienere's disease vs. Stress related from loss of son. Will start with labs and prn meclizine. If not better or continues to happen in next few weeks discussed referral to ENT and possible imaging of brain to rule out central cause.  - CBC with Differential/Platelet; Future - TSH; Future - COMPLETE METABOLIC PANEL WITH GFR; Future - COMPLETE METABOLIC PANEL WITH GFR - CBC with Differential/Platelet - TSH  2. PND (post-nasal drip) Trial of flonase at night. Instructed on how to use this. No signs of in sinus infection that would warrant antibiotics. He is to let me know if not having relief in a few weeks. Could also start anti histamine.   3. Grieving Sudden loss of son from drug overdose. He admits it's hard but seems to be coping in healthy fashion. He is to let me know if needs anything.   4. Elevated blood pressure -want him to keep a log for me over next few month -low salt diet, weight loss, exercise -f/u in 3 months with log.     This visit occurred during the SARS-CoV-2 public health emergency.  Safety protocols were in place, including screening questions prior to the visit, additional usage of staff PPE, and extensive cleaning of exam room while observing appropriate contact time as indicated for disinfecting solutions.    Return in about 3 months (around 07/13/2020) for blood presure check up .   Orma Flaming, MD [place]   04/12/2020

## 2020-04-13 ENCOUNTER — Encounter: Payer: Self-pay | Admitting: Family Medicine

## 2020-04-13 LAB — COMPLETE METABOLIC PANEL WITH GFR
AG Ratio: 2.1 (calc) (ref 1.0–2.5)
ALT: 44 U/L (ref 9–46)
AST: 28 U/L (ref 10–35)
Albumin: 4.7 g/dL (ref 3.6–5.1)
Alkaline phosphatase (APISO): 31 U/L — ABNORMAL LOW (ref 35–144)
BUN: 16 mg/dL (ref 7–25)
CO2: 25 mmol/L (ref 20–32)
Calcium: 9.7 mg/dL (ref 8.6–10.3)
Chloride: 106 mmol/L (ref 98–110)
Creat: 1.02 mg/dL (ref 0.70–1.25)
GFR, Est African American: 92 mL/min/{1.73_m2} (ref >=60)
GFR, Est Non African American: 80 mL/min/{1.73_m2} (ref >=60)
Globulin: 2.2 g/dL (calc) (ref 1.9–3.7)
Glucose, Bld: 92 mg/dL (ref 65–99)
Potassium: 4.3 mmol/L (ref 3.5–5.3)
Sodium: 143 mmol/L (ref 135–146)
Total Bilirubin: 0.4 mg/dL (ref 0.2–1.2)
Total Protein: 6.9 g/dL (ref 6.1–8.1)

## 2020-04-13 LAB — CBC WITH DIFFERENTIAL/PLATELET
Absolute Monocytes: 605 cells/uL (ref 200–950)
Basophils Absolute: 32 cells/uL (ref 0–200)
Basophils Relative: 0.5 %
Eosinophils Absolute: 290 cells/uL (ref 15–500)
Eosinophils Relative: 4.6 %
HCT: 51.1 % — ABNORMAL HIGH (ref 38.5–50.0)
Hemoglobin: 16.5 g/dL (ref 13.2–17.1)
Lymphs Abs: 1733 cells/uL (ref 850–3900)
MCH: 29 pg (ref 27.0–33.0)
MCHC: 32.3 g/dL (ref 32.0–36.0)
MCV: 89.8 fL (ref 80.0–100.0)
MPV: 9.6 fL (ref 7.5–12.5)
Monocytes Relative: 9.6 %
Neutro Abs: 3641 cells/uL (ref 1500–7800)
Neutrophils Relative %: 57.8 %
Platelets: 297 10*3/uL (ref 140–400)
RBC: 5.69 10*6/uL (ref 4.20–5.80)
RDW: 12.8 % (ref 11.0–15.0)
Total Lymphocyte: 27.5 %
WBC: 6.3 10*3/uL (ref 3.8–10.8)

## 2020-04-13 LAB — TSH: TSH: 1.39 mIU/L (ref 0.40–4.50)

## 2020-04-23 ENCOUNTER — Encounter: Payer: Self-pay | Admitting: Family Medicine

## 2020-04-23 DIAGNOSIS — R42 Dizziness and giddiness: Secondary | ICD-10-CM

## 2020-04-24 ENCOUNTER — Encounter: Payer: Self-pay | Admitting: Family Medicine

## 2020-04-24 NOTE — Telephone Encounter (Signed)
FYI, and please advise.

## 2020-04-30 NOTE — Telephone Encounter (Signed)
Referral for MRI

## 2020-05-03 ENCOUNTER — Other Ambulatory Visit: Payer: Self-pay

## 2020-05-03 DIAGNOSIS — R42 Dizziness and giddiness: Secondary | ICD-10-CM

## 2020-05-03 NOTE — Progress Notes (Unsigned)
mri

## 2020-05-07 ENCOUNTER — Encounter: Payer: Self-pay | Admitting: Family Medicine

## 2020-05-07 MED ORDER — ONDANSETRON 4 MG PO TBDP: 4.0000 mg | ORAL_TABLET | Freq: Three times a day (TID) | ORAL | 0 refills | Status: DC | PRN

## 2020-05-07 NOTE — Addendum Note (Signed)
Addended by: Orma Flaming on: 05/07/2020 04:39 PM   Modules accepted: Orders

## 2020-05-08 ENCOUNTER — Encounter: Payer: Self-pay | Admitting: Family Medicine

## 2020-05-11 ENCOUNTER — Encounter: Payer: Self-pay | Admitting: Family Medicine

## 2020-05-25 ENCOUNTER — Other Ambulatory Visit: Payer: Self-pay | Admitting: Family Medicine

## 2020-05-26 ENCOUNTER — Ambulatory Visit
Admission: RE | Admit: 2020-05-26 | Discharge: 2020-05-26 | Disposition: A | Discharge status: Home / Self Care | Payer: 59 | Source: Ambulatory Visit | Attending: Family Medicine | Admitting: Family Medicine

## 2020-05-26 DIAGNOSIS — R42 Dizziness and giddiness: Secondary | ICD-10-CM

## 2020-05-26 MED ORDER — GADOBENATE DIMEGLUMINE 529 MG/ML IV SOLN
20.0000 mL | Freq: Once | INTRAVENOUS | Status: AC | PRN
  Administered 2020-05-26: 20 mL via INTRAVENOUS

## 2020-05-28 ENCOUNTER — Ambulatory Visit: Payer: Self-pay

## 2020-05-28 ENCOUNTER — Encounter: Payer: Self-pay | Admitting: Rheumatology

## 2020-05-28 ENCOUNTER — Other Ambulatory Visit: Payer: Self-pay

## 2020-05-28 ENCOUNTER — Ambulatory Visit: Payer: 59 | Admitting: Rheumatology

## 2020-05-28 VITALS — BP 146/89 | HR 73 | Resp 16 | Ht 71.0 in | Wt 220.4 lb

## 2020-05-28 DIAGNOSIS — R7989 Other specified abnormal findings of blood chemistry: Secondary | ICD-10-CM

## 2020-05-28 DIAGNOSIS — M79642 Pain in left hand: Secondary | ICD-10-CM

## 2020-05-28 DIAGNOSIS — F439 Reaction to severe stress, unspecified: Secondary | ICD-10-CM

## 2020-05-28 DIAGNOSIS — Z79899 Other long term (current) drug therapy: Secondary | ICD-10-CM | POA: Diagnosis not present

## 2020-05-28 DIAGNOSIS — E782 Mixed hyperlipidemia: Secondary | ICD-10-CM

## 2020-05-28 DIAGNOSIS — L4050 Arthropathic psoriasis, unspecified: Secondary | ICD-10-CM

## 2020-05-28 DIAGNOSIS — L409 Psoriasis, unspecified: Secondary | ICD-10-CM | POA: Insufficient documentation

## 2020-05-28 DIAGNOSIS — R112 Nausea with vomiting, unspecified: Secondary | ICD-10-CM

## 2020-05-28 DIAGNOSIS — M79672 Pain in left foot: Secondary | ICD-10-CM

## 2020-05-28 DIAGNOSIS — M79641 Pain in right hand: Secondary | ICD-10-CM | POA: Diagnosis not present

## 2020-05-28 DIAGNOSIS — M25562 Pain in left knee: Secondary | ICD-10-CM

## 2020-05-28 DIAGNOSIS — M79671 Pain in right foot: Secondary | ICD-10-CM | POA: Diagnosis not present

## 2020-05-28 DIAGNOSIS — G8929 Other chronic pain: Secondary | ICD-10-CM

## 2020-05-28 DIAGNOSIS — M533 Sacrococcygeal disorders, not elsewhere classified: Secondary | ICD-10-CM

## 2020-05-28 DIAGNOSIS — R42 Dizziness and giddiness: Secondary | ICD-10-CM

## 2020-05-28 DIAGNOSIS — M25561 Pain in right knee: Secondary | ICD-10-CM | POA: Diagnosis not present

## 2020-05-28 DIAGNOSIS — Z8719 Personal history of other diseases of the digestive system: Secondary | ICD-10-CM

## 2020-05-28 DIAGNOSIS — M1A00X0 Idiopathic chronic gout, unspecified site, without tophus (tophi): Secondary | ICD-10-CM

## 2020-05-28 DIAGNOSIS — Z840 Family history of diseases of the skin and subcutaneous tissue: Secondary | ICD-10-CM

## 2020-05-28 DIAGNOSIS — R5383 Other fatigue: Secondary | ICD-10-CM

## 2020-05-28 NOTE — Progress Notes (Signed)
Office Visit Note  Patient: Jeffrey Klein             Date of Birth: 02/27/1961           MRN: [id number]             PCP: Orma Flaming, MD Referring: Orma Flaming, MD Visit Date: 05/28/2020 Occupation: @GUAROCC @  Subjective:  Pain in multiple joints.   History of Present Illness: Jeffrey Klein is a 59 y.o. male seen in consultation per request of his PCP.  According the patient in 1984 he started developing nail dystrophy and flaking in his ear canal.  He was seen by her PCP at the time and [facility] and was told that he had a fungal infection.  Later dermatologist was consulted who treated it with topical steroids and diagnosed with psoriasis.  He states in 2007 he moved to [place] and was in [place] where he was seen by a dermatologist who placed him on Enbrel.  He states that he did very well on Enbrel for many years is a skin completely cleared up and later psoriasis recurred and he was switched to Stelara which he took for 1 year with an adequate response.  Then Cosentyx was tried for 1 year and then he was switched to [employer].  He believes he has been on [place] for the last 6 years.  He denies having any joint pain now.  He states prior to that he was having joint pain in his elbows, wrist, hips, knees, ankles and his toes.  Recently has been experiencing some discomfort in his hips, knees, SI joints and cervical spine.  He denies any joint swelling.  He also recalls that in 2010 while he was in [place] he was seen by rheumatologist, Dr. Posey Pronto who did x-rays and labs and diagnosed him with gout.  He was placed on allopurinol at the time.  He has not had a gout flare in many years.  He denies any history of Achilles tendinitis, plantar fasciitis, iritis or any other tendinitis.  He moved to [facility] in February 2021 and has been taking [employer].  He has not established with a dermatologist.  He states he has mild scales on his right lower extremity and around his  umbilical region.  There is family history of psoriasis in his daughter, sister and paternal grandmother.  He reports that his son passed away in April 20, 2020.  Since then he has been experiencing dizziness nausea and vomiting.  He also has been experiencing headaches.  He had recent MRI of his brain which was negative.  Activities of Daily Living:  Patient reports morning stiffness for a few minutes.   Patient Denies nocturnal pain.  Difficulty dressing/grooming: Reports Difficulty climbing stairs: Denies Difficulty getting out of chair: Reports Difficulty using hands for taps, buttons, cutlery, and/or writing: Denies  Review of Systems  Constitutional: Positive for fatigue.  HENT: Positive for mouth dryness. Negative for mouth sores and nose dryness.   Eyes: Negative for pain, itching and dryness.  Respiratory: Negative for shortness of breath and difficulty breathing.   Cardiovascular: Negative for chest pain and palpitations.  Gastrointestinal: Negative for blood in stool, constipation and diarrhea.  Endocrine: Positive for excessive thirst. Negative for increased urination.  Genitourinary: Negative for difficulty urinating.  Musculoskeletal: Positive for arthralgias, joint pain, myalgias, morning stiffness, muscle tenderness and myalgias. Negative for joint swelling.  Skin: Negative for color change, rash and redness.  Allergic/Immunologic: Negative for susceptible to infections.  Neurological: Positive for dizziness and headaches. Negative for numbness, memory loss and weakness.  Hematological: Negative for bruising/bleeding tendency.  Psychiatric/Behavioral: Negative for confusion.    PMFS History:  Patient Active Problem List   Diagnosis Date Noted  . Gout 11/18/2019  . Hyperlipidemia, mixed 11/18/2019  . Low testosterone 11/18/2019  . Diverticulosis 11/18/2019  . Psoriatic arthritis ([place]) 11/18/2019    History reviewed. No pertinent past medical history.  Family  History  Problem Relation Age of Onset  . Psoriasis Sister   . Psoriasis Paternal Grandmother   . Healthy Daughter   . ADD / ADHD Son   . Drug abuse Son    Past Surgical History:  Procedure Laterality Date  . KNEE ARTHROSCOPY Bilateral   . LASIK Bilateral 2006  . TONSILLECTOMY     as a child   . WRIST SURGERY Right    Social History   Social History Narrative  . Not on file   Immunization History  Administered Date(s) Administered  . PFIZER SARS-COV-2 Vaccination 10/06/2019, 10/31/2019, 03/05/2020  . Zoster Recombinat (Shingrix) 04/05/2019     Objective: Vital Signs: BP (!) 146/89 (BP Location: Right Arm, Patient Position: Sitting, Cuff Size: Normal)   Pulse 73   Resp 16   Ht 5\' 11"  (1.803 m)   Wt 220 lb 6.4 oz (100 kg)   BMI 30.74 kg/m    Physical Exam Vitals and nursing note reviewed.  Constitutional:      Appearance: He is well-developed.  HENT:     Head: Normocephalic and atraumatic.  Eyes:     Conjunctiva/sclera: Conjunctivae normal.     Pupils: Pupils are equal, round, and reactive to light.  Cardiovascular:     Rate and Rhythm: Normal rate and regular rhythm.     Heart sounds: Normal heart sounds.  Pulmonary:     Effort: Pulmonary effort is normal.     Breath sounds: Normal breath sounds.  Abdominal:     General: Bowel sounds are normal.     Palpations: Abdomen is soft.     Comments: Abdominal hernia noted.  Musculoskeletal:     Cervical back: Normal range of motion and neck supple.  Skin:    General: Skin is warm and dry.     Capillary Refill: Capillary refill takes less than 2 seconds.  Neurological:     Mental Status: He is alert and oriented to person, place, and time.  Psychiatric:        Behavior: Behavior normal.      Musculoskeletal Exam: C-spine was in good range of motion.  He has some stiffness with right lateral rotation.  Thoracic and lumbar spine with good range of motion.  He had no SI joint tenderness.  Shoulder joints, elbow  joints, wrist joints with good range of motion.  He has bilateral DIP and PIP thickening with no synovitis.  Hip joints and knee joints with good range of motion.  He had PIP and DIP thickening with no synovitis.  There was no evidence of plantar fasciitis or Achilles tendinitis.  CDAI Exam: CDAI Score: -- Patient Global: --; Provider Global: -- Swollen: --; Tender: -- Joint Exam 05/28/2020   No joint exam has been documented for this visit   There is currently no information documented on the homunculus. Go to the Rheumatology activity and complete the homunculus joint exam.  Investigation: No additional findings.  Imaging: MR Brain W Wo Contrast  Result Date: 05/27/2020 CLINICAL DATA:  59 year old male with persistent, recurrent dizziness with  nausea for 3 months. Possible vascular or cardiac etiology. EXAM: MRI HEAD WITHOUT AND WITH CONTRAST TECHNIQUE: Multiplanar, multiecho pulse sequences of the brain and surrounding structures were obtained without and with intravenous contrast. CONTRAST:  51mL MULTIHANCE GADOBENATE DIMEGLUMINE 529 MG/ML IV SOLN COMPARISON:  None. FINDINGS: Brain: Cerebral volume is within normal limits for age. No restricted diffusion to suggest acute infarction. No midline shift, mass effect, evidence of mass lesion, ventriculomegaly, extra-axial collection or acute intracranial hemorrhage. Cervicomedullary junction and pituitary are within normal limits. Pearline Cables and white matter signal is within normal limits for age throughout the brain. No chronic cerebral blood products identified. No convincing encephalomalacia. Deep gray nuclei, brainstem and cerebellum appear normal. No abnormal enhancement identified.  No dural thickening. Vascular: Major intracranial vascular flow voids are preserved. The major dural venous sinuses are enhancing and appear to be patent. Skull and upper cervical spine: Normal visible upper cervical spine, bone marrow signal. Sinuses/Orbits: Negative  orbits. Trace paranasal sinus mucosal thickening. Other: Visible internal auditory structures appear normal. Mastoids are well aerated. Normal stylomastoid foramina. Visible face and scalp soft tissues appear negative. IMPRESSION: Normal MRI appearance of the brain. No explanation for dizziness, nausea. Electronically Signed   By: Genevie Ann M.D.   On: 05/27/2020 10:04    Recent Labs: Lab Results  Component Value Date   WBC 6.3 04/12/2020   HGB 16.5 04/12/2020   PLT 297 04/12/2020   NA 143 04/12/2020   K 4.3 04/12/2020   CL 106 04/12/2020   CO2 25 04/12/2020   GLUCOSE 92 04/12/2020   BUN 16 04/12/2020   CREATININE 1.02 04/12/2020   BILITOT 0.4 04/12/2020   ALKPHOS 37 (L) 11/18/2019   AST 28 04/12/2020   ALT 44 04/12/2020   PROT 6.9 04/12/2020   ALBUMIN 5.2 11/18/2019   CALCIUM 9.7 04/12/2020   GFRAA 92 04/12/2020   April 12, 2020 TSH normal Nov 18, 2019 LDL 105, uric acid 5.6, HIV negative, hepatitis C negative  Speciality Comments: Inadequate response to Enbrel, Stelara, Cosentyx. He has been on [place] for 6 years.  Procedures:  No procedures performed Allergies: Patient has no known allergies.   Assessment / Plan:     Visit Diagnoses: Psoriatic arthritis ([place]) -he was diagnosed with psoriatic arthritis in 2010.  He has tried Enbrel, Stelara, Cosentyx with an adequate response.  He has been on [employer] for 6years which has been working very well for him.  He has been getting this prescription from his rheumatologist in [place].  He had no synovitis on my examination today.  - Plan: Sedimentation rate  Psoriasis - dx in 1985.  He has been treated with topical agents in the past and also medications as mentioned above.  He has a small patch on his right leg and umbilical region.  High risk medication use - Taltz-started 09/23/16. failed Enbrel, humira, cosentyx, stelara) - Plan: Hepatitis B core antibody, IgM, Hepatitis B surface antigen, QuantiFERON-TB Gold Plus, Serum protein  electrophoresis with reflex, IgG, IgA, IgM, DG Chest 2 View  Medication counseling:  Baseline Immunosuppressant Therapy Labs TB GOLD   Hepatitis Panel Hepatitis Latest Ref Rng & Units 11/18/2019  Hep C Ab NON-REACTI NON-REACTIVE  Hep C Ab NON-REACTI NON-REACTIVE   HIV Lab Results  Component Value Date   HIV NON-REACTIVE 11/18/2019   Immunoglobulins   SPEP Serum Protein Electrophoresis Latest Ref Rng & Units 04/12/2020  Total Protein 6.1 - 8.1 g/dL 6.9   G6PD No results found for: G6PDH TPMT No results  found for: TPMT   Chest Xray: Pending  Does patient have a history of inflammatory bowel disease? No  Counseled patient that Donnetta Hail is a IL-17 inhibitor that works to reduce pain and inflammation associated with arthritis.  Counseled patient on purpose, proper use, and adverse effects of Taltz. Reviewed the most common adverse effects of infection, inflammatory bowel disease, and allergic reaction. Counseled patient that Donnetta Hail should be held for infection and prior to scheduled surgery.  Counseled patient to avoid live vaccines while on Taltz.  Advised patient to get annual influenza vaccine, pneumococcal vaccine, and Shingrix as indicated.  Reviewed storage information for Taltz.  Reviewed the importance of regular labs while on [place]. Standing orders placed and is to return in 1 month and then every 3 months after initiation.  Provided patient with medication education material and answered all questions.  Patient consented to [place].  Will upload consent into patient's chart.  Will apply for Taltz through patient's insurance and update when we receive a response.  Advised initial injection must be administered in office.  Patient voiced understanding.    He is on Taltz 80 mg subcu once a month.  Prescription will be sent to pharmacy pending lab results and insurance approval.  Pain in both hands -complains of pain and discomfort in his bilateral hands.  He had no synovitis.  He had  previous surgery on his right wrist with some limitation of range of motion.  Clinical findings are consistent with osteoarthritis.  Plan: XR Hand 2 View Right, XR Hand 2 View Left x-ray of bilateral hands consistent with osteoarthritis.  Right wrist joint showed some intercarpal and radiocarpal joint narrowing consistent with inflammatory arthritis.  Chronic SI joint pain -he continues to have some discomfort in SI joints.  Plan: XR Pelvis 1-2 Views.  SI joint x-ray showed some sclerosis.  Chronic pain of both knees -he complains of discomfort in his knee joints.  No warmth swelling effusion was noted.  Plan: XR KNEE 3 VIEW RIGHT, XR KNEE 3 VIEW LEFT left knee joint showed severe osteoarthritis and right mild osteoarthritis.  Irregularity in the cortex of left tibia and fibula was noted, which raises concern about previous injury.  Pain in both feet -clinical findings are consistent with osteoarthritis with no synovitis.  Plan: XR Foot 2 Views Right, XR Foot 2 Views Left x-rays are consistent with osteoarthritis.  Callus formation was noted in the third and fourth metatarsals.  Idiopathic chronic gout without tophus, unspecified site - Allopurinol 100 mg daily he was diagnosed with gout.  2007.  He states he has not had a gout flare in many years.- Plan: Uric acid  History of diverticulosis-no recent issues.  Dizziness-he has been experiencing dizziness with nausea.  Hyperlipidemia, mixed-LDL was mildly elevated.  Low testosterone  Family history of psoriasis in sister - Daughter and paternal grandmother  Other fatigue - Plan: CK  Non-intractable vomiting with nausea, unspecified vomiting type -patient has been under a lot of stress.  He states he lost his job in [place], was divorced and also lost his son in September 2021.  He states he is handling stress quite well.  He has been experiencing nausea vomiting and dizziness.  He had MRI of his brain recently which was normal.  Plan: Amylase,  Lipase  Stress-work and family related.   Orders: Orders Placed This Encounter  Procedures  . XR Hand 2 View Right  . XR Hand 2 View Left  . XR Pelvis 1-2 Views  .  XR KNEE 3 VIEW RIGHT  . XR KNEE 3 VIEW LEFT  . XR Foot 2 Views Right  . XR Foot 2 Views Left  . DG Chest 2 View  . Sedimentation rate  . CK  . Uric acid  . Hepatitis B core antibody, IgM  . Hepatitis B surface antigen  . QuantiFERON-TB Gold Plus  . Serum protein electrophoresis with reflex  . IgG, IgA, IgM  . Amylase  . Lipase   No orders of the defined types were placed in this encounter.     Follow-Up Instructions: Return for Psoriatic arthritis, gout.   Bo Merino, MD  Note - This record has been created using Editor, commissioning.  Chart creation errors have been sought, but may not always  have been located. Such creation errors do not reflect on  the standard of medical care.

## 2020-05-28 NOTE — Patient Instructions (Addendum)
Standing Labs We placed an order today for your standing lab work.   Please have your standing labs drawn in January and every 3 months  If possible, please have your labs drawn 2 weeks prior to your appointment so that the provider can discuss your results at your appointment.  We have open lab daily Monday through Thursday from 8:30-12:30 PM and 1:30-4:30 PM and Friday from 8:30-12:30 PM and 1:30-4:00 PM at the office of Dr. Bo Merino, [facility].   Please be advised, patients with office appointments requiring lab work will take precedents over walk-in lab work.  If possible, please come for your lab work on Monday and Friday afternoons, as you may experience shorter wait times. The office is located at [address], [place], [place], [place] [address] No appointment is necessary.   Labs are drawn by [facility]. Please bring your co-pay at the time of your lab draw.  You may receive a bill from [facility] for your lab work.  If you wish to have your labs drawn at another location, please call the office 24 hours in advance to send orders.  If you have any questions regarding directions or hours of operation,  please call [phone].   As a reminder, please drink plenty of water prior to coming for your lab work. Thanks!    Ixekizumab injection What is this medicine? IXEKIZUMAB (ix e KIZ ue mab) is used to treat plaque psoriasis, psoriatic arthritis, ankylosing spondylitis, and active non-radiographic axial spondyloarthritis. This medicine may be used for other purposes; ask your health care provider or pharmacist if you have questions. COMMON BRAND NAME(S): TALTZ What should I tell my health care provider before I take this medicine? They need to know if you have any of these conditions:  immune system problems  infection (especially a viral infection such as chickenpox, cold sores, or herpes)  recently received or are scheduled to receive a  vaccine  tuberculosis, a positive skin test for tuberculosis, or have recently been in close contact with someone who has tuberculosis  an unusual or allergic reaction to ixekizumab, other medicines, foods, dyes or preservatives  pregnant or trying to get pregnant  breast-feeding How should I use this medicine? This medicine is for injection under the skin. It may be administered by a healthcare professional in a hospital or clinic setting or at home. If you get this medicine at home, you will be taught how to prepare and give this medicine. Use exactly as directed. Take your medicine at regular intervals. Do not take your medicine more often than directed. It is important that you put your used needles and syringes in a special sharps container. Do not put them in a trash can. If you do not have a sharps container, call your pharmacist or healthcare provider to get one. A special MedGuide will be given to you by the pharmacist with each prescription and refill. Be sure to read this information carefully each time. Talk to your pediatrician regarding the use of this medicine in children. While this drug may be prescribed for children as young as 6 years for selected conditions, precautions do apply. Overdosage: If you think you have taken too much of this medicine contact a poison control center or emergency room at once. NOTE: This medicine is only for you. Do not share this medicine with others. What if I miss a dose? It is important not to miss your dose. Call your doctor of health care professional if you  are unable to keep an appointment. If you give yourself the medicine and you miss a dose, take it as soon as you can. Then be sure to take your next doses on your regular schedule. Do not take double or extra doses. If you have questions about a missed injection, call your health care professional. What may interact with this medicine? Do not take this medicine with any of the following  medications:  live virus vaccines This medicine may also interact with the following medications:  cyclosporine  inactivated vaccines  warfarin This list may not describe all possible interactions. Give your health care provider a list of all the medicines, herbs, non-prescription drugs, or dietary supplements you use. Also tell them if you smoke, drink alcohol, or use illegal drugs. Some items may interact with your medicine. What should I watch for while using this medicine? Tell your doctor or healthcare professional if your symptoms do not start to get better or if they get worse. You will be tested for tuberculosis (TB) before you start this medicine. If your doctor prescribes any medicine for TB, you should start taking the TB medicine before starting this medicine. Make sure to finish the full course of TB medicine. Call your doctor or healthcare professional for advice if you get a fever, chills or sore throat, or other symptoms of a cold or flu. Do not treat yourself. This drug decreases your body's ability to fight infections. Try to avoid being around people who are sick. This medicine can decrease the response to a vaccine. If you need to get vaccinated, tell your healthcare professional if you have received this medicine within the last 6 months. Extra booster doses may be needed. Talk to your doctor to see if a different vaccination schedule is needed. What side effects may I notice from receiving this medicine? Side effects that you should report to your doctor or health care professional as soon as possible:  allergic reactions like skin rash, itching or hives, swelling of the face, lips, or tongue  signs and symptoms of infection like fever or chills; cough; sore throat; pain or trouble passing urine  signs and symptoms of bowel problems like abdominal pain, diarrhea, blood in the stool, and weight loss  white patches in the mouth or throat  vaginal discharge, itching, or  odor in women Side effects that usually do not require medical attention (report to your doctor or health care professional if they continue or are bothersome):  nausea  runny nose  sinus trouble This list may not describe all possible side effects. Call your doctor for medical advice about side effects. You may report side effects to FDA at 1-800-FDA-1088. Where should I keep my medicine? Keep out of the reach of children. Store the prefilled syringe or injection pen in a refrigerator between 2 to 8 degrees C (36 to 46 degrees F). Keep the syringe or the pen in the original carton until ready for use. Protect from light. Do not freeze. Do not shake. Prior to use, remove the syringe or pen from the refrigerator and use within 30 minutes. Throw away any unused medicine after the expiration date on the label. NOTE: This sheet is a summary. It may not cover all possible information. If you have questions about this medicine, talk to your doctor, pharmacist, or health care provider.  2020 Elsevier/Gold Standard (2018-12-07 19:35:08)  Heart Disease Prevention   Your inflammatory disease increases your risk of heart disease which includes heart attack, stroke,  atrial fibrillation (irregular heartbeats), high blood pressure, heart failure and atherosclerosis (plaque in the arteries).  It is important to reduce your risk by:   . Keep blood pressure, cholesterol, and blood sugar at healthy levels   . Smoking Cessation   . Maintain a healthy weight  o BMI 20-25   . Eat a healthy diet  o Plenty of fresh fruit, vegetables, and whole grains  o Limit saturated fats, foods high in sodium, and added sugars  o DASH and Mediterranean diet   . Increase physical activity  o Recommend moderate physically activity for 150 minutes per week/ 30 minutes a day for five days a week These can be broken up into three separate ten-minute sessions during the day.   . Reduce Stress  . Meditation, slow breathing  exercises, yoga, coloring books  . Dental visits twice a year

## 2020-05-30 ENCOUNTER — Other Ambulatory Visit: Payer: Self-pay

## 2020-05-30 ENCOUNTER — Encounter: Payer: Self-pay | Admitting: Family Medicine

## 2020-05-30 DIAGNOSIS — R42 Dizziness and giddiness: Secondary | ICD-10-CM

## 2020-05-30 LAB — IGG, IGA, IGM
IgG (Immunoglobin G), Serum: 802 mg/dL (ref 600–1640)
IgM, Serum: 31 mg/dL — ABNORMAL LOW (ref 50–300)
Immunoglobulin A: 140 mg/dL (ref 47–310)

## 2020-05-30 LAB — PROTEIN ELECTROPHORESIS, SERUM, WITH REFLEX
Albumin ELP: 4.6 g/dL (ref 3.8–4.8)
Alpha 1: 0.2 g/dL (ref 0.2–0.3)
Alpha 2: 0.8 g/dL (ref 0.5–0.9)
Beta 2: 0.4 g/dL (ref 0.2–0.5)
Beta Globulin: 0.4 g/dL (ref 0.4–0.6)
Gamma Globulin: 0.8 g/dL (ref 0.8–1.7)
Total Protein: 7.2 g/dL (ref 6.1–8.1)

## 2020-05-30 LAB — AMYLASE: Amylase: 33 U/L (ref 21–101)

## 2020-05-30 LAB — HEPATITIS B CORE ANTIBODY, IGM: Hep B C IgM: NONREACTIVE

## 2020-05-30 LAB — QUANTIFERON-TB GOLD PLUS
Mitogen-NIL: 7.4 IU/mL
NIL: 0.03 IU/mL
QuantiFERON-TB Gold Plus: NEGATIVE
TB1-NIL: 0 IU/mL
TB2-NIL: 0 IU/mL

## 2020-05-30 LAB — URIC ACID: Uric Acid, Serum: 6.9 mg/dL (ref 4.0–8.0)

## 2020-05-30 LAB — HEPATITIS B SURFACE ANTIGEN: Hepatitis B Surface Ag: NONREACTIVE

## 2020-05-30 LAB — LIPASE: Lipase: 14 U/L (ref 7–60)

## 2020-05-30 LAB — SEDIMENTATION RATE: Sed Rate: 2 mm/h (ref 0–20)

## 2020-05-30 LAB — CK: Total CK: 134 U/L (ref 44–196)

## 2020-06-04 NOTE — Progress Notes (Signed)
Office Visit Note  Patient: Jeffrey Klein             Date of Birth: [birth year]           MRN: [id number]             PCP: Orma Flaming, MD Referring: Orma Flaming, MD Visit Date: 06/18/2020 Occupation: @GUAROCC @  Subjective:  Point pain and stiffness.   History of Present Illness: Jeffrey Klein is a 59 y.o. male history of psoriatic arthritis and osteoarthritis.  He states his arthritis is very well controlled on Taltz.  He continues to have some discomfort in SI joints, hip joints and his left knee.  He states left knee joint is not severe enough to have a total knee replacement.  He has seen an orthopedic surgeon in the past.  He has some psoriasis in the umbilical area.  Activities of Daily Living:  Patient reports morning stiffness for a few minutes.   Patient Reports nocturnal pain.   Difficulty dressing/grooming: Denies Difficulty climbing stairs: Denies Difficulty getting out of chair: Denies Difficulty using hands for taps, buttons, cutlery, and/or writing: Denies  Review of Systems  Constitutional: Positive for fatigue.  HENT: Positive for mouth dryness. Negative for mouth sores and nose dryness.   Eyes: Negative for pain, itching and dryness.  Respiratory: Negative for shortness of breath and difficulty breathing.   Cardiovascular: Negative for chest pain and palpitations.  Gastrointestinal: Negative for blood in stool, constipation and diarrhea.  Endocrine: Negative for increased urination.  Genitourinary: Negative for difficulty urinating.  Musculoskeletal: Positive for arthralgias, joint pain and morning stiffness. Negative for joint swelling, myalgias, muscle tenderness and myalgias.  Skin: Negative for color change, rash and redness.  Allergic/Immunologic: Negative for susceptible to infections.  Neurological: Positive for light-headedness and headaches. Negative for numbness, memory loss and weakness.  Hematological: Negative for bruising/bleeding  tendency.  Psychiatric/Behavioral: Negative for confusion.    PMFS History:  Patient Active Problem List   Diagnosis Date Noted  . Primary osteoarthritis of both hands 06/18/2020  . Chronic SI joint pain 06/18/2020  . Primary osteoarthritis of both hips 06/18/2020  . Primary osteoarthritis of both knees 06/18/2020  . Primary osteoarthritis of both feet 06/18/2020  . 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 Psoriasis 05/28/2020  . Gout 11/18/2019  . Hyperlipidemia, mixed 11/18/2019  . Low testosterone 11/18/2019  . Diverticulosis 11/18/2019  . Psoriatic arthritis ([place]) 11/18/2019    History reviewed. No pertinent past medical history.  Family History  Problem Relation Age of Onset  . Psoriasis Sister   . Psoriasis Paternal Grandmother   . Healthy Daughter   . ADD / ADHD Son   . Drug abuse Son    Past Surgical History:  Procedure Laterality Date  . KNEE ARTHROSCOPY Bilateral   . LASIK Bilateral 2006  . TONSILLECTOMY     as a child   . WRIST SURGERY Right    Social History   Social History Narrative  . Not on file   Immunization History  Administered Date(s) Administered  . PFIZER SARS-COV-2 Vaccination 10/06/2019, 10/31/2019, 03/05/2020  . Zoster Recombinat (Shingrix) 04/05/2019     Objective: Vital Signs: BP (!) 138/92 (BP Location: Left Arm, Patient Position: Sitting, Cuff Size: Normal)   Pulse 75   Resp 16   Ht 5\' 10"  (1.778 m)   Wt 222 lb 3.2 oz (100.8 kg)   BMI 31.88 kg/m    Physical Exam Vitals and nursing note reviewed.  Constitutional:  Appearance: He is well-developed and well-nourished.  HENT:     Head: Normocephalic and atraumatic.  Eyes:     Extraocular Movements: EOM normal.     Conjunctiva/sclera: Conjunctivae normal.     Pupils: Pupils are equal, round, and reactive to light.  Cardiovascular:     Rate and Rhythm: Normal rate and regular rhythm.     Heart sounds: Normal heart sounds.  Pulmonary:     Effort: Pulmonary effort is  normal.     Breath sounds: Normal breath sounds.  Abdominal:     General: Bowel sounds are normal.     Palpations: Abdomen is soft.  Musculoskeletal:     Cervical back: Normal range of motion and neck supple.  Skin:    General: Skin is warm and dry.     Capillary Refill: Capillary refill takes less than 2 seconds.  Neurological:     Mental Status: He is alert and oriented to person, place, and time.  Psychiatric:        Mood and Affect: Mood and affect normal.        Behavior: Behavior normal.      Musculoskeletal Exam: C-spine thoracic and lumbar spine were in good range of motion.  Shoulder joints, elbow joints, wrist joints with good range of motion.  He had bilateral PIP and DIP thickening.  Hip joints, knee joints, ankles with good range of motion with no synovitis.  He has some discomfort range of motion of left knee joint without any warmth swelling or effusion.  CDAI Exam: CDAI Score: -- Patient Global: --; Provider Global: -- Swollen: --; Tender: -- Joint Exam 06/18/2020   No joint exam has been documented for this visit   There is currently no information documented on the homunculus. Go to the Rheumatology activity and complete the homunculus joint exam.  Investigation: No additional findings.  Imaging: MR Brain W Wo Contrast  Result Date: 05/27/2020 CLINICAL DATA:  59 year old male with persistent, recurrent dizziness with nausea for 3 months. Possible vascular or cardiac etiology. EXAM: MRI HEAD WITHOUT AND WITH CONTRAST TECHNIQUE: Multiplanar, multiecho pulse sequences of the brain and surrounding structures were obtained without and with intravenous contrast. CONTRAST:  60mL MULTIHANCE GADOBENATE DIMEGLUMINE 529 MG/ML IV SOLN COMPARISON:  None. FINDINGS: Brain: Cerebral volume is within normal limits for age. No restricted diffusion to suggest acute infarction. No midline shift, mass effect, evidence of mass lesion, ventriculomegaly, extra-axial collection or acute  intracranial hemorrhage. Cervicomedullary junction and pituitary are within normal limits. Pearline Cables and white matter signal is within normal limits for age throughout the brain. No chronic cerebral blood products identified. No convincing encephalomalacia. Deep gray nuclei, brainstem and cerebellum appear normal. No abnormal enhancement identified.  No dural thickening. Vascular: Major intracranial vascular flow voids are preserved. The major dural venous sinuses are enhancing and appear to be patent. Skull and upper cervical spine: Normal visible upper cervical spine, bone marrow signal. Sinuses/Orbits: Negative orbits. Trace paranasal sinus mucosal thickening. Other: Visible internal auditory structures appear normal. Mastoids are well aerated. Normal stylomastoid foramina. Visible face and scalp soft tissues appear negative. IMPRESSION: Normal MRI appearance of the brain. No explanation for dizziness, nausea. Electronically Signed   By: Genevie Ann M.D.   On: 05/27/2020 10:04   XR Foot 2 Views Left  Result Date: 05/28/2020 First MTP, PIP and DIP narrowing was noted. No intertarsal or tibiotalar or subtalar joint space narrowing was noted.  Inferior calcaneal spur was noted. Impression: These findings are consistent with osteoarthritis  of the foot.  XR Foot 2 Views Right  Result Date: 05/28/2020 First MTP, PIP and DIP narrowing was noted.  Narrowing of the second MTP joint and cystic versus erosive changes were noted.  Possible callus formation was noted in the third and fourth metatarsal.  No intertarsal, tibiotalar or subtalar joint space narrowing was noted. Impression: These findings are consistent with osteoarthritis.  XR Hand 2 View Left  Result Date: 05/28/2020 PIP and DIP narrowing was noted.  No MCP narrowing was noted.  No intercarpal radiocarpal joint space narrowing was noted.  No erosive changes were noted. Impression: These findings are consistent with osteoarthritis of the hand.  XR Hand 2  View Right  Result Date: 05/28/2020 PIP and DIP narrowing was noted.  No MCP or metacarpocarpal joint space narrowing was noted.  Intercarpal and radiocarpal joint space narrowing was noted.  No erosive changes were noted. Impression: These findings are consistent with osteoarthritis and inflammatory arthritis overlap.  XR KNEE 3 VIEW LEFT  Result Date: 05/28/2020 Severe medial compartment narrowing with medial and intercondylar osteophytes are noted.  Severe patellofemoral narrowing was noted.  Irregularity of the fibular cortex and the tibial cortex was noted. Impression: These findings are consistent with severe osteoarthritis and severe chondromalacia patella.  XR KNEE 3 VIEW RIGHT  Result Date: 05/28/2020 Mild medial compartment narrowing was noted.  Intercondylar osteophytes were noted.  Moderate patellofemoral narrowing was noted.  No chondrocalcinosis was noted. Impression: These findings are consistent with mild osteoarthritis and moderate chondromalacia patella.  XR Pelvis 1-2 Views  Result Date: 05/28/2020 No SI joint narrowing or sclerosis was noted.  Mild hip joint narrowing was noted bilaterally. Impression: These findings are consistent with some osteoarthritic changes in the SI joints and hip joints.   Recent Labs: Lab Results  Component Value Date   WBC 6.3 04/12/2020   HGB 16.5 04/12/2020   PLT 297 04/12/2020   NA 143 04/12/2020   K 4.3 04/12/2020   CL 106 04/12/2020   CO2 25 04/12/2020   GLUCOSE 92 04/12/2020   BUN 16 04/12/2020   CREATININE 1.02 04/12/2020   BILITOT 0.4 04/12/2020   ALKPHOS 37 (L) 11/18/2019   AST 28 04/12/2020   ALT 44 04/12/2020   PROT 7.2 05/28/2020   ALBUMIN 5.2 11/18/2019   CALCIUM 9.7 04/12/2020   GFRAA 92 04/12/2020   QFTBGOLDPLUS NEGATIVE 05/28/2020   May 28, 2020 SPEP normal, TB Gold negative, immunoglobulin IgM 31 (low), hepatitis B-, CK 134, sed rate 2, uric acid 6.9, amylase normal, lipase normal April 12, 2020 TSH  normal Nov 18, 2019 LDL 105, uric acid 5.6, HIV negative, hepatitis C negative  Speciality Comments: Inadequate response to Enbrel, Stelara, Cosentyx. He has been on [place] for 6 years.  Procedures:  No procedures performed Allergies: Patient has no known allergies.   Assessment / Plan:     Visit Diagnoses: Psoriatic arthritis ([place]) - he was diagnosed with psoriatic arthritis in 2010.  He has been getting this prescription from his rheumatologist in [place].  He has been tolerating Taltz well.  We will apply for Taltz.  Psoriasis - dx in 1985.  He has some psoriasis in the medical region.  High risk medication use - Taltz-started 09/23/16. Inadequate response to Enbrel, Stelara, Cosentyx.He has been on [place] for 6 years.  His labs were normal.  His next lab will be in January and then every 3 months to monitor for drug toxicity.  Primary osteoarthritis of both hands-he has severe osteoarthritis.  Joint protection muscle strengthening was discussed.  Chronic SI joint pain-he continues to have some SI joint discomfort.  But he had no tenderness on palpation.  Primary osteoarthritis of both hips - Bilateral mild osteoarthritis of the hip joint.  Primary osteoarthritis of both knees - Left knee with severe osteoarthritis and severe chondromalacia patella.  Right knee with mild osteoarthritis and moderate chondromalacia patella.  He has seen an orthopedic surgeon in the past.  He is not interested in left total knee replacement as he does not have much discomfort.  Primary osteoarthritis of both feet-proper fitting shoes were discussed.  Idiopathic chronic gout without tophus, unspecified site - Allopurinol 100 mg po qd.  His uric acid is 6.5.  He is not having any gout flares.  Family history of psoriasis in sister  Selective IgM deficiency (HCC)-increased risk of infection associated with IgM deficiency was discussed.  I offered referral to immunologist but he declined.  Hyperlipidemia,  mixed-weight loss diet and exercise was discussed.  Association of increase in heart disease with psoriatic arthritis was discussed.  Non-intractable vomiting with nausea, unspecified vomiting type-patient states his nausea and vomiting is improving.  He will be seeing an ENT physician and also gastroenterologist.  His amylase and lipase were normal.  History of diverticulosis  Dizziness  Low testosterone  Stress  Orders: No orders of the defined types were placed in this encounter.  No orders of the defined types were placed in this encounter.    Follow-Up Instructions: Return in about 3 months (around 09/16/2020) for Psoriatic arthritis, Osteoarthritis.   Bo Merino, MD  Note - This record has been created using Editor, commissioning.  Chart creation errors have been sought, but may not always  have been located. Such creation errors do not reflect on  the standard of medical care.

## 2020-06-18 ENCOUNTER — Other Ambulatory Visit: Payer: Self-pay

## 2020-06-18 ENCOUNTER — Ambulatory Visit: Payer: 59 | Admitting: Rheumatology

## 2020-06-18 ENCOUNTER — Encounter: Payer: Self-pay | Admitting: Rheumatology

## 2020-06-18 VITALS — BP 138/92 | HR 75 | Resp 16 | Ht 70.0 in | Wt 222.2 lb

## 2020-06-18 DIAGNOSIS — F439 Reaction to severe stress, unspecified: Secondary | ICD-10-CM

## 2020-06-18 DIAGNOSIS — D804 Selective deficiency of immunoglobulin M [IgM]: Secondary | ICD-10-CM

## 2020-06-18 DIAGNOSIS — G8929 Other chronic pain: Secondary | ICD-10-CM

## 2020-06-18 DIAGNOSIS — Z79899 Other long term (current) drug therapy: Secondary | ICD-10-CM | POA: Diagnosis not present

## 2020-06-18 DIAGNOSIS — L409 Psoriasis, unspecified: Secondary | ICD-10-CM | POA: Diagnosis not present

## 2020-06-18 DIAGNOSIS — M19072 Primary osteoarthritis, left ankle and foot: Secondary | ICD-10-CM | POA: Insufficient documentation

## 2020-06-18 DIAGNOSIS — L4050 Arthropathic psoriasis, unspecified: Secondary | ICD-10-CM | POA: Diagnosis not present

## 2020-06-18 DIAGNOSIS — M19042 Primary osteoarthritis, left hand: Secondary | ICD-10-CM

## 2020-06-18 DIAGNOSIS — M19041 Primary osteoarthritis, right hand: Secondary | ICD-10-CM | POA: Diagnosis not present

## 2020-06-18 DIAGNOSIS — M1A00X0 Idiopathic chronic gout, unspecified site, without tophus (tophi): Secondary | ICD-10-CM

## 2020-06-18 DIAGNOSIS — M19071 Primary osteoarthritis, right ankle and foot: Secondary | ICD-10-CM

## 2020-06-18 DIAGNOSIS — E782 Mixed hyperlipidemia: Secondary | ICD-10-CM

## 2020-06-18 DIAGNOSIS — Z840 Family history of diseases of the skin and subcutaneous tissue: Secondary | ICD-10-CM

## 2020-06-18 DIAGNOSIS — M160 Bilateral primary osteoarthritis of hip: Secondary | ICD-10-CM

## 2020-06-18 DIAGNOSIS — Z8719 Personal history of other diseases of the digestive system: Secondary | ICD-10-CM

## 2020-06-18 DIAGNOSIS — M170 Bilateral primary osteoarthritis of knee: Secondary | ICD-10-CM | POA: Insufficient documentation

## 2020-06-18 DIAGNOSIS — M533 Sacrococcygeal disorders, not elsewhere classified: Secondary | ICD-10-CM

## 2020-06-18 DIAGNOSIS — R112 Nausea with vomiting, unspecified: Secondary | ICD-10-CM

## 2020-06-18 DIAGNOSIS — R7989 Other specified abnormal findings of blood chemistry: Secondary | ICD-10-CM

## 2020-06-18 DIAGNOSIS — R42 Dizziness and giddiness: Secondary | ICD-10-CM

## 2020-06-18 NOTE — Patient Instructions (Signed)
Standing Labs We placed an order today for your standing lab work.   Please have your standing labs drawn in January and every 3 months   If possible, please have your labs drawn 2 weeks prior to your appointment so that the provider can discuss your results at your appointment.  We have open lab daily Monday through Thursday from 8:30-12:30 PM and 1:30-4:30 PM and Friday from 8:30-12:30 PM and 1:30-4:00 PM at the office of Dr. Chico Cawood, [facility].   Please be advised, patients with office appointments requiring lab work will take precedents over walk-in lab work.  If possible, please come for your lab work on Monday and Friday afternoons, as you may experience shorter wait times. The office is located at [address], [address], [place], [place] [address] No appointment is necessary.   Labs are drawn by [facility]. Please bring your co-pay at the time of your lab draw.  You may receive a bill from [facility] for your lab work.  If you wish to have your labs drawn at another location, please call the office 24 hours in advance to send orders.  If you have any questions regarding directions or hours of operation,  please call [phone].   As a reminder, please drink plenty of water prior to coming for your lab work. Thanks!   

## 2020-07-16 ENCOUNTER — Encounter: Payer: Self-pay | Admitting: Family Medicine

## 2020-07-16 ENCOUNTER — Ambulatory Visit: Payer: BC Managed Care – PPO | Admitting: Family Medicine

## 2020-07-16 ENCOUNTER — Other Ambulatory Visit: Payer: Self-pay

## 2020-07-16 VITALS — BP 122/80 | HR 72 | Temp 98.4°F | Resp 18 | Ht 70.0 in | Wt 220.6 lb

## 2020-07-16 DIAGNOSIS — R42 Dizziness and giddiness: Secondary | ICD-10-CM | POA: Diagnosis not present

## 2020-07-16 NOTE — Patient Instructions (Addendum)
-  f/u with ENT  -find date of flu shot and email me and the tdap shot -see if you can get colonoscopy records.   Let me know if you need anything! Good seeing you will do your annual at next visit so do not eat!   Happy new year!  Dr. [place] Blocker

## 2020-07-16 NOTE — Progress Notes (Signed)
Patient: Jeffrey Klein MRN: [id number] DOB: 10-05-1960 PCP: Orma Flaming, MD     Subjective:  Chief Complaint  Patient presents with  . Dizziness    Patient stated that his dizziness has gotten better. He has a ENT appointment in March. He stated that since he started taking zinc once a day his dizziness has subsided.     HPI: The patient is a 60 y.o. male who presents today for dizziness and grieving. I have done a work up with normal brain MRI. He has already been referred to ENT and sees them in March. His last dizzy episode was in December and he had no vomiting with this episode. He has also been better since he stopped the zinc. He does use meclizine prn. Work up has been negative and I still wonder if covid vaccines have contributed.   He wanted to know if grieving could have contributed as his some died from a drug OD last year. He is not struggling emotionally at this time. He just wanted to know if it could have been related. Denies any symptoms of depression.   Last colonoscopy: in [place]: 2018. F/u in 5 years? Do not have records.   He has had tdap in last 10 years.   Review of Systems  Constitutional: Negative for chills, fatigue and fever.  HENT: Negative for dental problem, ear pain, hearing loss and trouble swallowing.   Eyes: Negative for visual disturbance.  Respiratory: Negative for cough, chest tightness and shortness of breath.   Cardiovascular: Negative for chest pain, palpitations and leg swelling.  Gastrointestinal: Negative for abdominal pain, blood in stool, diarrhea and nausea.  Endocrine: Negative for cold intolerance, polydipsia, polyphagia and polyuria.  Genitourinary: Negative for dysuria and hematuria.  Musculoskeletal: Negative for arthralgias.  Skin: Negative for rash.  Neurological: Negative for dizziness and headaches.  Psychiatric/Behavioral: Negative for dysphoric mood and sleep disturbance. The patient is not nervous/anxious.      Allergies Patient has No Known Allergies.  Past Medical History Patient  has no past medical history on file.  Surgical History Patient  has a past surgical history that includes Wrist surgery (Right); Tonsillectomy; LASIK (Bilateral, 2006); and Knee arthroscopy (Bilateral).  Family History Pateint's family history includes ADD / ADHD in his son; Drug abuse in his son; Healthy in his daughter; Psoriasis in his paternal grandmother and sister.  Social History Patient  reports that he has never smoked. He has never used smokeless tobacco. He reports current alcohol use of about 5.0 standard drinks of alcohol per week. He reports that he does not use drugs.    Objective: Vitals:   07/16/20 0810  BP: 122/80  Pulse: 72  Resp: 18  Temp: 98.4 F (36.9 C)  TempSrc: Temporal  SpO2: 95%  Weight: 220 lb 9.6 oz (100.1 kg)  Height: 5\' 10"  (1.778 m)    Body mass index is 31.65 kg/m.  Physical Exam Vitals reviewed.  Constitutional:      Appearance: Normal appearance. He is obese.  HENT:     Head: Normocephalic and atraumatic.     Right Ear: Tympanic membrane, ear canal and external ear normal.     Left Ear: Tympanic membrane, ear canal and external ear normal.     Nose: Nose normal.     Mouth/Throat:     Mouth: Mucous membranes are moist.  Eyes:     Extraocular Movements: Extraocular movements intact.     Conjunctiva/sclera: Conjunctivae normal.     Pupils: Pupils are equal,  round, and reactive to light.  Neck:     Vascular: No carotid bruit.  Cardiovascular:     Rate and Rhythm: Normal rate and regular rhythm.     Heart sounds: Normal heart sounds.  Pulmonary:     Effort: Pulmonary effort is normal.     Breath sounds: Normal breath sounds.  Abdominal:     General: Bowel sounds are normal.     Palpations: Abdomen is soft.  Musculoskeletal:     Cervical back: Normal range of motion and neck supple.  Skin:    Capillary Refill: Capillary refill takes less than 2  seconds.  Neurological:     General: No focal deficit present.     Mental Status: He is alert and oriented to person, place, and time.  Psychiatric:        Mood and Affect: Mood normal.        Behavior: Behavior normal.        Assessment/plan: 1. Dizziness -doing better. Discussed I would only recommend a low dose zinc if he wants to take daily (20mg ). Nothing over 50mg  as it can interfere with copper absorption in the gut. Doubtful toxic as he was on 50mg .  -keep ENT appointment, MRI brain and labs have been normal.  -has done his vertigo exercises and doesn't really feel like meclizine helps. Clinically wasn't consistent with BPPV.  -will keep me posted with ENT.   Has his annual in may.    This visit occurred during the SARS-CoV-2 public health emergency.  Safety protocols were in place, including screening questions prior to the visit, additional usage of staff PPE, and extensive cleaning of exam room while observing appropriate contact time as indicated for disinfecting solutions.     Return in about 4 months (around 11/13/2020) for annual with fasting labs .     Orma Flaming, MD [place]  07/16/2020

## 2020-07-19 ENCOUNTER — Ambulatory Visit: Payer: PRIVATE HEALTH INSURANCE | Admitting: Rheumatology

## 2020-08-02 ENCOUNTER — Telehealth: Payer: Self-pay | Admitting: Rheumatology

## 2020-08-02 DIAGNOSIS — Z79899 Other long term (current) drug therapy: Secondary | ICD-10-CM

## 2020-08-02 NOTE — Telephone Encounter (Signed)
Patient calling in reference to [place] 80mg  injectable once a month. Patient wants to continue with medication, and needs Dr. Estanislado Pandy to prescribe. Insurance does not want to cover, and they are requesting information from Dr. Estanislado Pandy. BCBS  / Cover My Meds  pharmacy phone [phone] fax# [phone]. Please call to advise patient.

## 2020-08-02 NOTE — Telephone Encounter (Addendum)
Please start [place]. Patient is already established on Taltz 80mg  every 28 days.  I'll notify patient that he is due for labs while we work on prior authorization through new [employer]. Per previous rheumatologist notes, patient has not tried/failed MTX or sulfasalazine. But he has tried and failed numerous biologics such as Humira, Enbrel, and Cosentyx.  Patient notified that he is due for labs - advised that it's clinic protocol for [place] to routine labs every 3 months. He verbalized understanding. He will reach out to his PCP to see if CBC with diff and CMP with GFR were drawn this year. Provided him fax number to have his PCP office fax results to [place]. He plans to stop by to get labs if these labs were not drawn. Standing orders for CBC with diff/platelet and CMP with GFR placed today to be drawn every 3 months  Knox Saliva, PharmD, MPH Clinical Pharmacist (Rheumatology and Pulmonology)

## 2020-08-02 NOTE — Telephone Encounter (Signed)
Submitted a Prior Authorization request to [facility] for [place] via Cover My Meds. Will update once we receive a response.   (Key: [id number])

## 2020-08-03 NOTE — Telephone Encounter (Signed)
Received notification from [facility] regarding a prior authorization for [place]. Authorization has been APPROVED from 08/02/20 to 08/01/21.   Authorization # [id number]  *Per plan, Jeffrey Klein will cover autoinjectors and syringes. Spoke to patient and he prefers syringes.  Authorization has not been added in their system to process for a paid claim. Will try again.

## 2020-08-03 NOTE — Telephone Encounter (Addendum)
Patient would like Taltz rx sent to [facility].  Taltz copay cardKara Dies: [id number] Grp: [id number] PCN: [facility] ID: [id number] Suf: 01  Patient is due for labs CBC/CMP. He plans to stop by this week - may have other labs drawn that his PCP wanted completed as well. Advised that we can see Dr. Shelby Mattocks lab and he can go to their clinic to have them drawn if needed.  Will send rx after labs result. Patient took last Taltz dose last week.  Knox Saliva, PharmD, MPH Clinical Pharmacist (Rheumatology and Pulmonology)

## 2020-08-08 ENCOUNTER — Ambulatory Visit: Payer: PRIVATE HEALTH INSURANCE | Admitting: Rheumatology

## 2020-08-09 ENCOUNTER — Encounter: Payer: Self-pay | Admitting: Family Medicine

## 2020-08-10 ENCOUNTER — Other Ambulatory Visit: Payer: Self-pay | Admitting: Family Medicine

## 2020-08-13 ENCOUNTER — Ambulatory Visit: Payer: BC Managed Care – PPO | Admitting: Physician Assistant

## 2020-08-13 ENCOUNTER — Other Ambulatory Visit: Payer: Self-pay

## 2020-08-13 ENCOUNTER — Encounter: Payer: Self-pay | Admitting: Physician Assistant

## 2020-08-13 VITALS — BP 122/80 | HR 80 | Temp 98.0°F | Resp 16 | Ht 70.0 in | Wt 227.0 lb

## 2020-08-13 DIAGNOSIS — J0100 Acute maxillary sinusitis, unspecified: Secondary | ICD-10-CM

## 2020-08-13 MED ORDER — AMOXICILLIN-POT CLAVULANATE 875-125 MG PO TABS: 1.0000 | ORAL_TABLET | Freq: Two times a day (BID) | ORAL | 0 refills | Status: DC

## 2020-08-13 NOTE — Progress Notes (Signed)
Subjective:    Patient ID: Jeffrey Klein, male    DOB: 01/11/1961, 60 y.o.   MRN: [id number]  Chief Complaint  Patient presents with  . URI    Started 3 week ago. Having nasal congestion-yellow discharge, fatigue, chest congestion. Had negative covid test. Will have some gargle with deep breath.     HPI Chief complaint: Chest congestion Symptom onset: About 3 weeks ago  Pertinent positives: Sinus congestion, headache, some fatigue Pertinent negatives: No fever, chest pain or SOB, no loss of taste or smell Treatments tried: Aspirin, Ibuprofen Vaccine status: He has Pfizer vaccines as well as the booster.  Sick exposure: Daughter with two kids, some head cold symptoms  Negative COVID-19 test on 08/08/20 at [facility].   History reviewed. No pertinent past medical history.  Past Surgical History:  Procedure Laterality Date  . KNEE ARTHROSCOPY Bilateral   . LASIK Bilateral 2006  . TONSILLECTOMY     as a child   . WRIST SURGERY Right     Family History  Problem Relation Age of Onset  . Psoriasis Sister   . Psoriasis Paternal Grandmother   . Healthy Daughter   . ADD / ADHD Son   . Drug abuse Son     Social History   Socioeconomic History  . Marital status: Divorced    Spouse name: Not on file  . Number of children: Not on file  . Years of education: Not on file  . Highest education level: Not on file  Occupational History  . Not on file  Tobacco Use  . Smoking status: Never Smoker  . Smokeless tobacco: Never Used  Vaping Use  . Vaping Use: Never used  Substance and Sexual Activity  . Alcohol use: Yes    Alcohol/week: 5.0 standard drinks    Types: 1 Glasses of wine, 4 Cans of beer per week    Comment: occ  . Drug use: Never  . Sexual activity: Not on file  Other Topics Concern  . Not on file  Social History Narrative  . Not on file   Social Determinants of Health   Financial Resource Strain: Not on file  Food Insecurity: Not on file  Transportation  Needs: Not on file  Physical Activity: Not on file  Stress: Not on file  Social Connections: Not on file  Intimate Partner Violence: Not on file    Outpatient Medications Prior to Visit  Medication Sig Dispense Refill  . allopurinol (ZYLOPRIM) 100 MG tablet Take 1 tablet (100 mg total) by mouth daily. 90 tablet 1  . ASPIRIN 81 PO Take by mouth daily.    . betamethasone valerate (VALISONE) 0.1 % cream Apply 1 application topically as needed.     . Multiple Vitamins-Minerals (ZINC PO) Take by mouth daily.    . Omega-3 Fatty Acids (FISH OIL PO) Take by mouth daily.    . rosuvastatin (CRESTOR) 10 MG tablet TAKE ONE TABLET BY MOUTH DAILY AT BEDTIME 90 tablet 1  . TALTZ 80 MG/ML SOSY every 30 (thirty) days.     . Testosterone 12.5 MG/ACT (1%) GEL Place 50 mg onto the skin daily. 150 g 1  . VITAMIN E PO Take by mouth daily.    . meclizine (ANTIVERT) 25 MG tablet TAKE ONE TABLET BY MOUTH THREE TIMES DAILY AS NEEDED FOR DIZZINESS (Patient not taking: No sig reported) 30 tablet 0   No facility-administered medications prior to visit.    No Known Allergies  Review of Systems SEE  HPI FOR PERTINENT + AND -     Objective:    Physical Exam Vitals and nursing note reviewed.  Constitutional:      Appearance: Normal appearance.  HENT:     Head: Normocephalic and atraumatic.     Right Ear: Tympanic membrane and ear canal normal.     Left Ear: Tympanic membrane and ear canal normal.     Nose: Congestion present.     Right Sinus: Maxillary sinus tenderness present.     Left Sinus: Maxillary sinus tenderness present.     Mouth/Throat:     Mouth: Mucous membranes are moist.  Eyes:     Extraocular Movements: Extraocular movements intact.     Pupils: Pupils are equal, round, and reactive to light.  Cardiovascular:     Rate and Rhythm: Normal rate and regular rhythm.     Pulses: Normal pulses.     Heart sounds: Normal heart sounds.  Pulmonary:     Effort: Pulmonary effort is normal.      Breath sounds: Normal breath sounds.  Neurological:     Mental Status: He is alert.     BP 122/80   Pulse 80   Temp 98 F (36.7 C) (Temporal)   Resp 16   Ht 5\' 10"  (1.778 m)   Wt 227 lb (103 kg)   SpO2 96%   BMI 32.57 kg/m  Wt Readings from Last 3 Encounters:  08/13/20 227 lb (103 kg)  07/16/20 220 lb 9.6 oz (100.1 kg)  06/18/20 222 lb 3.2 oz (100.8 kg)   Assessment & Plan:   Problem List Items Addressed This Visit   None     1. Acute non-recurrent maxillary sinusitis Persistent symptoms >10 days despite conservative efforts at home. Will Rx Augmentin at this time, take with food. Cautioned on antibiotic use and possible side effects. Advised nasal saline, humidifier, and pushing fluids. Call if worse or no improvement.    Murice Barbar M Akira Adelsberger, PA-C

## 2020-08-13 NOTE — Patient Instructions (Signed)
Sinusitis, Adult Sinusitis is inflammation of your sinuses. Sinuses are hollow spaces in the bones around your face. Your sinuses are located:  Around your eyes.  In the middle of your forehead.  Behind your nose.  In your cheekbones. Mucus normally drains out of your sinuses. When your nasal tissues become inflamed or swollen, mucus can become trapped or blocked. This allows bacteria, viruses, and fungi to grow, which leads to infection. Most infections of the sinuses are caused by a virus. Sinusitis can develop quickly. It can last for up to 4 weeks (acute) or for more than 12 weeks (chronic). Sinusitis often develops after a cold. What are the causes? This condition is caused by anything that creates swelling in the sinuses or stops mucus from draining. This includes:  Allergies.  Asthma.  Infection from bacteria or viruses.  Deformities or blockages in your nose or sinuses.  Abnormal growths in the nose (nasal polyps).  Pollutants, such as chemicals or irritants in the air.  Infection from fungi (rare). What increases the risk? You are more likely to develop this condition if you:  Have a weak body defense system (immune system).  Do a lot of swimming or diving.  Overuse nasal sprays.  Smoke. What are the signs or symptoms? The main symptoms of this condition are pain and a feeling of pressure around the affected sinuses. Other symptoms include:  Stuffy nose or congestion.  Thick drainage from your nose.  Swelling and warmth over the affected sinuses.  Headache.  Upper toothache.  A cough that may get worse at night.  Extra mucus that collects in the throat or the back of the nose (postnasal drip).  Decreased sense of smell and taste.  Fatigue.  A fever.  Sore throat.  Bad breath. How is this diagnosed? This condition is diagnosed based on:  Your symptoms.  Your medical history.  A physical exam.  Tests to find out if your condition is  acute or chronic. This may include: ? Checking your nose for nasal polyps. ? Viewing your sinuses using a device that has a light (endoscope). ? Testing for allergies or bacteria. ? Imaging tests, such as an MRI or CT scan. In rare cases, a bone biopsy may be done to rule out more serious types of fungal sinus disease. How is this treated? Treatment for sinusitis depends on the cause and whether your condition is chronic or acute.  If caused by a virus, your symptoms should go away on their own within 10 days. You may be given medicines to relieve symptoms. They include: ? Medicines that shrink swollen nasal passages (topical intranasal decongestants). ? Medicines that treat allergies (antihistamines). ? A spray that eases inflammation of the nostrils (topical intranasal corticosteroids). ? Rinses that help get rid of thick mucus in your nose (nasal saline washes).  If caused by bacteria, your health care provider may recommend waiting to see if your symptoms improve. Most bacterial infections will get better without antibiotic medicine. You may be given antibiotics if you have: ? A severe infection. ? A weak immune system.  If caused by narrow nasal passages or nasal polyps, you may need to have surgery. Follow these instructions at home: Medicines  Take, use, or apply over-the-counter and prescription medicines only as told by your health care provider. These may include nasal sprays.  If you were prescribed an antibiotic medicine, take it as told by your health care provider. Do not stop taking the antibiotic even if you start   to feel better. Hydrate and humidify  Drink enough fluid to keep your urine pale yellow. Staying hydrated will help to thin your mucus.  Use a cool mist humidifier to keep the humidity level in your home above 50%.  Inhale steam for 10-15 minutes, 3-4 times a day, or as told by your health care provider. You can do this in the bathroom while a hot shower is  running.  Limit your exposure to cool or dry air.   Rest  Rest as much as possible.  Sleep with your head raised (elevated).  Make sure you get enough sleep each night. General instructions  Apply a warm, moist washcloth to your face 3-4 times a day or as told by your health care provider. This will help with discomfort.  Wash your hands often with soap and water to reduce your exposure to germs. If soap and water are not available, use hand sanitizer.  Do not smoke. Avoid being around people who are smoking (secondhand smoke).  Keep all follow-up visits as told by your health care provider. This is important.   Contact a health care provider if:  You have a fever.  Your symptoms get worse.  Your symptoms do not improve within 10 days. Get help right away if:  You have a severe headache.  You have persistent vomiting.  You have severe pain or swelling around your face or eyes.  You have vision problems.  You develop confusion.  Your neck is stiff.  You have trouble breathing. Summary  Sinusitis is soreness and inflammation of your sinuses. Sinuses are hollow spaces in the bones around your face.  This condition is caused by nasal tissues that become inflamed or swollen. The swelling traps or blocks the flow of mucus. This allows bacteria, viruses, and fungi to grow, which leads to infection.  If you were prescribed an antibiotic medicine, take it as told by your health care provider. Do not stop taking the antibiotic even if you start to feel better.  Keep all follow-up visits as told by your health care provider. This is important. This information is not intended to replace advice given to you by your health care provider. Make sure you discuss any questions you have with your health care provider. Document Revised: 11/23/2017 Document Reviewed: 11/23/2017 Elsevier Patient Education  2021 [employer]  

## 2020-08-15 ENCOUNTER — Telehealth: Payer: Self-pay

## 2020-08-15 NOTE — Telephone Encounter (Signed)
Patient called stating he would like to combine the labwork that both Dr. Estanislado Pandy and his PCP Dr. Rogers Blocker need.  Patient is requesting a return call to let him know if Dr. Estanislado Pandy would add his PCP labwork to her order or if it is better to send Dr. Tempie Hoist to his PCP office.

## 2020-08-15 NOTE — Telephone Encounter (Signed)
Okay to add the labs from PCP.

## 2020-08-15 NOTE — Telephone Encounter (Signed)
Left message to advise patient we would be able to add additional labs from PCP. Advised patient to contact the office to advise which labs we will need to add.

## 2020-08-16 ENCOUNTER — Telehealth: Payer: Self-pay

## 2020-08-16 ENCOUNTER — Other Ambulatory Visit: Payer: Self-pay

## 2020-08-16 DIAGNOSIS — Z79899 Other long term (current) drug therapy: Secondary | ICD-10-CM | POA: Diagnosis not present

## 2020-08-16 NOTE — Telephone Encounter (Signed)
Orders in 

## 2020-08-16 NOTE — Telephone Encounter (Signed)
Patient called stating he was returning Andrea's call.  Patient states the labs that his PCP needs added to Dr. Tempie Hoist are CBC with Diff and CMP w/GFR.  Patient states his PCP wants him to fast for the labs so he plans to come by the office today at 11:00 am.

## 2020-08-17 ENCOUNTER — Other Ambulatory Visit: Payer: Self-pay | Admitting: Pharmacist

## 2020-08-17 ENCOUNTER — Telehealth: Payer: Self-pay | Admitting: *Deleted

## 2020-08-17 ENCOUNTER — Other Ambulatory Visit: Payer: Self-pay | Admitting: Rheumatology

## 2020-08-17 DIAGNOSIS — L4050 Arthropathic psoriasis, unspecified: Secondary | ICD-10-CM

## 2020-08-17 DIAGNOSIS — R7989 Other specified abnormal findings of blood chemistry: Secondary | ICD-10-CM

## 2020-08-17 DIAGNOSIS — L409 Psoriasis, unspecified: Secondary | ICD-10-CM

## 2020-08-17 LAB — COMPLETE METABOLIC PANEL WITH GFR
AG Ratio: 2.2 (calc) (ref 1.0–2.5)
ALT: 92 U/L — ABNORMAL HIGH (ref 9–46)
AST: 70 U/L — ABNORMAL HIGH (ref 10–35)
Albumin: 4.8 g/dL (ref 3.6–5.1)
Alkaline phosphatase (APISO): 42 U/L (ref 35–144)
BUN: 14 mg/dL (ref 7–25)
CO2: 31 mmol/L (ref 20–32)
Calcium: 10 mg/dL (ref 8.6–10.3)
Chloride: 103 mmol/L (ref 98–110)
Creat: 0.89 mg/dL (ref 0.70–1.25)
GFR, Est African American: 107 mL/min/{1.73_m2} (ref >=60)
GFR, Est Non African American: 92 mL/min/{1.73_m2} (ref >=60)
Globulin: 2.2 g/dL (calc) (ref 1.9–3.7)
Glucose, Bld: 92 mg/dL (ref 65–99)
Potassium: 4.5 mmol/L (ref 3.5–5.3)
Sodium: 141 mmol/L (ref 135–146)
Total Bilirubin: 0.6 mg/dL (ref 0.2–1.2)
Total Protein: 7 g/dL (ref 6.1–8.1)

## 2020-08-17 LAB — CBC WITH DIFFERENTIAL/PLATELET
Absolute Monocytes: 563 cells/uL (ref 200–950)
Basophils Absolute: 22 cells/uL (ref 0–200)
Basophils Relative: 0.5 %
Eosinophils Absolute: 237 cells/uL (ref 15–500)
Eosinophils Relative: 5.5 %
HCT: 48.4 % (ref 38.5–50.0)
Hemoglobin: 16.1 g/dL (ref 13.2–17.1)
Lymphs Abs: 1062 cells/uL (ref 850–3900)
MCH: 29.4 pg (ref 27.0–33.0)
MCHC: 33.3 g/dL (ref 32.0–36.0)
MCV: 88.3 fL (ref 80.0–100.0)
MPV: 9.4 fL (ref 7.5–12.5)
Monocytes Relative: 13.1 %
Neutro Abs: 2417 cells/uL (ref 1500–7800)
Neutrophils Relative %: 56.2 %
Platelets: 298 10*3/uL (ref 140–400)
RBC: 5.48 10*6/uL (ref 4.20–5.80)
RDW: 13.3 % (ref 11.0–15.0)
Total Lymphocyte: 24.7 %
WBC: 4.3 10*3/uL (ref 3.8–10.8)

## 2020-08-17 MED ORDER — TALTZ 80 MG/ML ~~LOC~~ SOSY: 80.0000 mg | PREFILLED_SYRINGE | SUBCUTANEOUS | 0 refills | Status: DC

## 2020-08-17 NOTE — Progress Notes (Signed)
CBC is normal, LFTs are elevated.  Most likely due to the recent use of antibiotics.  Patient should repeat AST and ALT in 1 month.  Please forward results to his PCP.

## 2020-08-17 NOTE — Telephone Encounter (Signed)
-----   Message from Bo Merino, MD sent at 08/17/2020  3:06 PM EST ----- CBC is normal, LFTs are elevated.  Most likely due to the recent use of antibiotics.  Patient should repeat AST and ALT in 1 month.  Please forward results to his PCP.

## 2020-08-17 NOTE — Telephone Encounter (Signed)
Rx for Taltz sent to [facility]. Patient has labs drawn 08/16/20. LFTs were elevated likely d/t antibiotic use. CBC wnl.  Will draw labs again at office visit with Hazel Sams, PA-C on 09/17/20.  Knox Saliva, PharmD, MPH Clinical Pharmacist (Rheumatology and Pulmonology)

## 2020-08-21 MED FILL — TALTZ 80 MG/ML SOSY: 80 | 28 days supply | Qty: 1 | Fill #0

## 2020-08-27 ENCOUNTER — Other Ambulatory Visit: Payer: Self-pay

## 2020-08-27 ENCOUNTER — Telehealth: Payer: Self-pay

## 2020-08-27 NOTE — Telephone Encounter (Signed)
I definitely sent that before finishing. Yes, he is requesting a refill. His insurance is telling him he will need a Prior Auth

## 2020-08-27 NOTE — Telephone Encounter (Signed)
Is he requesting a refill?

## 2020-08-27 NOTE — Telephone Encounter (Signed)
Okay, no worries. I'll give him a call.

## 2020-08-27 NOTE — Telephone Encounter (Signed)
Pt called back in regards to his Testerone gel. He states he got the labs needed done at Dr. Viann Fish office last week. Pt states he was on the get when his testosterone was tested back on 5/21. He states back in [place] he was given the injections, then switched to the gel.

## 2020-08-29 MED ORDER — TESTOSTERONE 12.5 MG/ACT (1%) TD GEL
50.0000 mg | Freq: Every day | TRANSDERMAL | 1 refills | Status: DC
Start: 2020-08-29 — End: 2020-09-03

## 2020-09-03 MED ORDER — TESTOSTERONE 12.5 MG/ACT (1%) TD GEL: 2.0000 | Freq: Every day | TRANSDERMAL | 1 refills | Status: DC

## 2020-09-03 NOTE — Progress Notes (Signed)
Office Visit Note  Patient: Jeffrey Klein             Date of Birth: 07/31/1960           MRN: [id number]             PCP: Orma Flaming, MD Referring: Orma Flaming, MD Visit Date: 09/17/2020 Occupation: @GUAROCC @  Subjective:  Right knee joint pain   History of Present Illness: Jeffrey Klein is a 60 y.o. male with history of psoriatic arthritis and osteoarthritis.  He is currently on Taltz 80 mg sq injections every 28 days.  He has not missed any doses of taltz recently. He presents today with chronic right knee joint pain. He reports about 3 weeks ago he was on a ladder and may have twisted his knee during that time.  He denies any other injury or fall.  He is not experiencing any mechanical symptoms at this time.  He has been experiencing nocturnal pain and has started to wear a compression sleeve daily.  He has noticed some increased lower back pain due to gait changes.  He has occasional SI joint and hip discomfort at times.  He denies any pain or joint swelling in his hands or feet.   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  He uses betamethasone cream topically as needed.  He takes allopurinol 100 mg by mouth daily for management of gout.  He denies any recent gout flares.    Activities of Daily Living:  Patient reports morning stiffness for all day.  Patient Reports nocturnal pain.  Difficulty dressing/grooming: Reports Difficulty climbing stairs: Reports Difficulty getting out of chair: Reports Difficulty using hands for taps, buttons, cutlery, and/or writing: Denies  Review of Systems  Constitutional: Positive for fatigue.  HENT: Positive for mouth dryness. Negative for mouth sores and nose dryness.   Eyes: Negative for pain, itching and dryness.  Respiratory: Negative for shortness of breath and difficulty breathing.   Cardiovascular: Negative for chest pain and palpitations.  Gastrointestinal: Negative for blood in stool, constipation  and diarrhea.  Endocrine: Negative for increased urination.  Genitourinary: Negative for difficulty urinating.  Musculoskeletal: Positive for arthralgias, joint pain, joint swelling, myalgias, morning stiffness and myalgias. Negative for muscle tenderness.  Skin: Negative for color change, rash and redness.  Allergic/Immunologic: Negative for susceptible to infections.  Neurological: Negative for dizziness, numbness, headaches, memory loss and weakness.  Hematological: Negative for bruising/bleeding tendency.  Psychiatric/Behavioral: Negative for confusion.    PMFS History:  Patient Active Problem List   Diagnosis Date Noted  . Primary osteoarthritis of both hands 06/18/2020  . Chronic SI joint pain 06/18/2020  . Primary osteoarthritis of both hips 06/18/2020  . Primary osteoarthritis of both knees 06/18/2020  . Primary osteoarthritis of both feet 06/18/2020  . 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 Gout 11/18/2019  . Hyperlipidemia, mixed 11/18/2019  . Low testosterone 11/18/2019  . Diverticulosis 11/18/2019  . Psoriatic arthritis ([place]) 11/18/2019    History reviewed. No pertinent past medical history.  Family History  Problem Relation Age of Onset  . Psoriasis Sister   . Psoriasis Paternal Grandmother   . Healthy Daughter   . ADD / ADHD Son   . Drug abuse Son    Past Surgical History:  Procedure Laterality Date  . KNEE ARTHROSCOPY Bilateral   . LASIK Bilateral 2006  . TONSILLECTOMY     as a child   . WRIST SURGERY Right    Social  History   Social History Narrative  . Not on file   Immunization History  Administered Date(s) Administered  . PFIZER(Purple Top)SARS-COV-2 Vaccination 10/06/2019, 10/31/2019, 03/05/2020  . Zoster Recombinat (Shingrix) 04/05/2019, 06/17/2019     Objective: Vital Signs: BP (!) 148/88 (BP Location: Left Arm, Patient Position: Sitting, Cuff Size: Normal)   Pulse 72   Resp 15   Ht 5' 11.65" (1.82 m)   Wt 224 lb 9.6 oz (101.9 kg)    BMI 30.76 kg/m    Physical Exam Vitals and nursing note reviewed.  Constitutional:      Appearance: He is well-developed.  HENT:     Head: Normocephalic and atraumatic.  Eyes:     Conjunctiva/sclera: Conjunctivae normal.     Pupils: Pupils are equal, round, and reactive to light.  Pulmonary:     Effort: Pulmonary effort is normal.  Abdominal:     Palpations: Abdomen is soft.  Musculoskeletal:     Cervical back: Normal range of motion and neck supple.  Skin:    General: Skin is warm and dry.     Capillary Refill: Capillary refill takes less than 2 seconds.  Neurological:     Mental Status: He is alert and oriented to person, place, and time.  Psychiatric:        Behavior: Behavior normal.      Musculoskeletal Exam: C-spine, thoracic spine, and lumbar spine good ROM.  Shoulder joints, elbow joints, wrist joints, MCPs, PIPs, and DIPs good ROM with no synovitis.  Complete fist formation bilaterally.  PIP and DIP thickening consistent with osteoarthritis.  Hip joints good ROM with no discomfort.  Painful ROM and mild warmth of the right knee joint.  Left knee has good ROM with no warmth or effusion.  Ankle joints good ROM with no tenderness or swelling.  No achilles tendonitis.  No calf tightness or tenderness.   CDAI Exam: CDAI Score: -- Patient Global: --; Provider Global: -- Swollen: --; Tender: -- Joint Exam 09/17/2020   No joint exam has been documented for this visit   There is currently no information documented on the homunculus. Go to the Rheumatology activity and complete the homunculus joint exam.  Investigation: No additional findings.  Imaging: No results found.  Recent Labs: Lab Results  Component Value Date   WBC 4.3 08/16/2020   HGB 16.1 08/16/2020   PLT 298 08/16/2020   NA 141 08/16/2020   K 4.5 08/16/2020   CL 103 08/16/2020   CO2 31 08/16/2020   GLUCOSE 92 08/16/2020   BUN 14 08/16/2020   CREATININE 0.89 08/16/2020   BILITOT 0.6 08/16/2020    ALKPHOS 37 (L) 11/18/2019   AST 70 (H) 08/16/2020   ALT 92 (H) 08/16/2020   PROT 7.0 08/16/2020   ALBUMIN 5.2 11/18/2019   CALCIUM 10.0 08/16/2020   GFRAA 107 08/16/2020   QFTBGOLDPLUS NEGATIVE 05/28/2020    Speciality Comments: Inadequate response to Enbrel, Stelara, Cosentyx. He has been on [place] for 6 years.  Procedures:  Large Joint Inj: R knee on 09/17/2020 9:04 AM Indications: pain Details: 27 G 1.5 in needle, medial approach  Arthrogram: No  Medications: 1.5 mL lidocaine 1 %; 40 mg triamcinolone acetonide 40 MG/ML Aspirate: 0 mL Outcome: tolerated well, no immediate complications Procedure, treatment alternatives, risks and benefits explained, specific risks discussed. Consent was given by the patient. Immediately prior to procedure a time out was called to verify the correct patient, procedure, equipment, support staff and site/side marked as required. Patient was prepped  and draped in the usual sterile fashion.     Allergies: Patient has no known allergies.     Assessment / Plan:     Visit Diagnoses: Psoriatic arthritis ([place]): He has no synovitis or dactylitis on exam.  He presents today with acute pain in the right knee joint which started about 3 weeks ago.  He did not have any injury or fall prior to the onset of symptoms, but he was up on a ladder and is unsure if he twisted his right knee during that time.  On examination he has painful extension and mild warmth of the right knee but no effusion was noted.  The right knee joint was injected with cortisone today.  He tolerated the procedure well.  He continues to experience occasional discomfort in both SI joints and both hips.  He has no tenderness palpation over the SI joints on examination today.  He has not had any Achilles tendinitis or plantar fasciitis.  Overall he is clinically been doing well on Taltz 80 mg subcutaneous injections every 4 weeks.  He has not missed any doses of Taltz recently.  He will continue  on the current treatment regimen.  He was advised to notify us if his right knee joint pain persists or worsens.  He will follow-up in the office in 5 months.  Psoriasis - Diagnosed in 1985.  Chronic patch of psoriasis in the umbilical region.  He has not developed any new patches of psoriasis recently.  He has a prescription for betamethasone cream which she uses topically as needed.  He will remain on Taltz as prescribed.  High risk medication use - Taltz 80 mg sq injections every 4 weeks-started 09/23/16-initially prescribed by rheum in [place]. Inadequate response to Enbrel, Stelara, and Cosentyx.  CBC and CMP updated on 08/16/20.  LFTs elevated.  We will check hepatic function panel today.  He will continue to require updated lab work every 3 months to monitor for drug toxicity.  TB gold negative on 05/28/20 and will continue to be monitored yearly. - Plan: Hepatic function panel He has not had any recent infections. Advised to hold Donnetta Hail if he develops signs or symptoms of an infection and to resume once the infection has completely cleared. He has received 3 Pfizer COVID-19 vaccines.  Primary osteoarthritis of both hands: He has PIP and DIP thickening consistent with osteoarthritis of both hands.  No joint tenderness or inflammation noted.  Complete fist formation bilaterally.  Joint protection and muscle strengthening were discussed.   Chronic SI joint pain: Experiences occasional discomfort in both SI joints.  No tenderness palpation on examination today.  Primary osteoarthritis of both hips: He has good range of motion of both hip joints on exam.  Primary osteoarthritis of both knees: He presents today with increased pain in the right knee joint which started 3 weeks ago.  He typically has chronic pain in the left knee joint but it has subsided over the past several weeks.  On exam he has painful extension and warmth of the right knee joint but no effusion was noted.  X-rays of both knees  were obtained on 05/28/2020 which were reviewed today in the office.  We discussed the importance of lower extremity muscle strengthening.  He was given a handout of exercises to perform.  The right knee joint was injected with cortisone today.  He was advised to notify us if his discomfort persists or worsens.  Acute pain of right knee -He presents today with  increased pain in the right knee joint which started about 3 weeks ago.  According to the patient he was working on a ladder and is unsure if he twisted his right knee during that time.  He has not had any other injuries or falls prior to the onset of symptoms.  He has been experiencing intermittent nocturnal pain over the past several weeks.  He has been wearing a compression sleeve which has alleviated some of his discomfort.  He has painful extension and warmth on examination today.  He is not experiencing any mechanical symptoms at this time.  X-rays of the right knee were obtained on 05/28/2020 and reviewed today in the office.  The right knee joint was injected with cortisone.  Procedure note was completed above.  Aftercare was discussed we discussed that if his symptoms persist or worsen we will proceed with an MRI of the right knee for further evaluation.  He was given a handout of knee joint exercises to start to perform once his knee joint pain improves.  Plan: Large Joint Inj: R knee  Primary osteoarthritis of both feet: He is not experiencing any discomfort in his feet at this time.   Idiopathic chronic gout without tophus, unspecified site - He has not had any recent gout flares.  He takes allopurinol 100 mg 1 tablet by mouth daily.  He is tolerating allopurinol without any side effects.  His uric acid level was 6.9 on 05/28/2020.  We discussed that ideally his uric acid level should be less than 6.  He states that he occasionally misses of urine all doses but will try to remain compliant with his prescription. - Plan: Uric acid  Elevated  LFTs -ALT was 92 and AST was 70 on 08/16/2020.  Hepatic function panel will be checked today.  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 Large Joint Inj: R knee  . Hepatic function panel  . Uric acid   No orders of the defined types were placed in this encounter.   Follow-Up Instructions: Return in about 5 months (around 02/17/2021) for Psoriatic arthritis, Osteoarthritis.   Ofilia Neas, PA-C  Note - This record has been created using Dragon software.  Chart creation errors have been sought, but may not always  have been located. Such creation errors do not reflect on  the standard of medical care.

## 2020-09-03 NOTE — Addendum Note (Signed)
Addended by: Orma Flaming on: 09/03/2020 03:34 PM   Modules accepted: Orders

## 2020-09-04 ENCOUNTER — Encounter (INDEPENDENT_AMBULATORY_CARE_PROVIDER_SITE_OTHER): Payer: Self-pay | Admitting: Otolaryngology

## 2020-09-04 ENCOUNTER — Other Ambulatory Visit: Payer: Self-pay

## 2020-09-04 ENCOUNTER — Ambulatory Visit (INDEPENDENT_AMBULATORY_CARE_PROVIDER_SITE_OTHER): Payer: BC Managed Care – PPO | Admitting: Otolaryngology

## 2020-09-04 VITALS — Temp 97.3°F

## 2020-09-04 DIAGNOSIS — R42 Dizziness and giddiness: Secondary | ICD-10-CM | POA: Diagnosis not present

## 2020-09-04 NOTE — Progress Notes (Signed)
HPI: Jeffrey Klein is a 60 y.o. male who presents is referred by his PCP Dr. Rogers Blocker for evaluation of dizziness.  Patient describes episodes of dizziness where he has imbalance and some nausea and vomiting.  He describes this more as unsteady than actual spinning or vertigo.  Almost feels like he is seasick.  His last episode of this dizziness occurred in December and has had no further episodes since that time.  He had a couple bouts of dizziness earlier in the year and underwent an MRI scan following a bout of dizziness in November and the MRI scan was negative for any cause of dizziness. During the last episode he was under a lot of stress and this may have contributed some to his dizziness.  He also recently obtained Covid shots. 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  This was identified by [employer] hearing test performed at work.  However his hearing was not bad enough to warrant hearing aids. He has had no dizziness or vertigo over the past 2 months..  No past medical history on file. Past Surgical History:  Procedure Laterality Date  . KNEE ARTHROSCOPY Bilateral   . LASIK Bilateral 2006  . TONSILLECTOMY     as a child   . WRIST SURGERY Right    Social History   Socioeconomic History  . Marital status: Divorced    Spouse name: Not on file  . Number of children: Not on file  . Years of education: Not on file  . Highest education level: Not on file  Occupational History  . Not on file  Tobacco Use  . Smoking status: Never Smoker  . Smokeless tobacco: Never Used  Vaping Use  . Vaping Use: Never used  Substance and Sexual Activity  . Alcohol use: Yes    Alcohol/week: 5.0 standard drinks    Types: 1 Glasses of wine, 4 Cans of beer per week    Comment: occ  . Drug use: Never  . Sexual activity: Not on file  Other Topics Concern  . Not on file  Social History  Narrative  . Not on file   Social Determinants of Health   Financial Resource Strain: Not on file  Food Insecurity: Not on file  Transportation Needs: Not on file  Physical Activity: Not on file  Stress: Not on file  Social Connections: Not on file   Family History  Problem Relation Age of Onset  . Psoriasis Sister   . Psoriasis Paternal Grandmother   . Healthy Daughter   . ADD / ADHD Son   . Drug abuse Son    No Known Allergies Prior to Admission medications   Medication Sig Start Date End Date Taking? Authorizing Provider  allopurinol (ZYLOPRIM) 100 MG tablet Take 1 tablet (100 mg total) by mouth daily. 11/18/19   Orma Flaming, MD  amoxicillin-clavulanate (AUGMENTIN) 875-125 MG tablet Take 1 tablet by mouth 2 (two) times daily. Patient not taking: Reported on 09/04/2020 08/13/20   Allwardt, Randa Evens, PA-C  ASPIRIN 81 PO Take by mouth daily.    [provider]  betamethasone valerate (VALISONE) 0.1 % cream Apply 1 application topically as needed.  08/24/19   [provider]  meclizine (ANTIVERT) 25 MG tablet TAKE ONE TABLET BY MOUTH THREE TIMES DAILY AS NEEDED FOR DIZZINESS Patient not taking: No sig reported 05/25/20   Orma Flaming, MD  Multiple Vitamins-Minerals (ZINC PO) Take by mouth daily.    [provider]  Omega-3 Fatty Acids (FISH OIL PO) Take by mouth daily.    [provider]  rosuvastatin (CRESTOR) 10 MG tablet TAKE ONE TABLET BY MOUTH DAILY AT BEDTIME 08/10/20   Orma Flaming, MD  TALTZ 80 MG/ML SOSY Inject 80 mg into the skin every 28 (twenty-eight) days. Copay card: Kara Dies: [id number];  Grp: [id number]; PCNCarleene Cooper;  ID: [id number];  Suf: 01 08/17/20   Deveshwar, Abel Presto, MD  Testosterone 12.5 MG/ACT (1%) GEL Place 2 Pump onto the skin daily. 09/03/20   Orma Flaming, MD  VITAMIN E PO Take by mouth daily.    [provider]     Positive ROS: Otherwise negative  All other systems have been reviewed and were otherwise negative  with the exception of those mentioned in the HPI and as above.  Physical Exam: Constitutional: Alert, well-appearing, no acute distress Ears: External ears without lesions or tenderness. Ear canals are clear bilaterally with intact, clear TMs.  On hearing screening with the 1024 tuning fork he had essentially normal hearing in both ears.  He had no clinical evidence of BPPV.  Middle ear spaces were clear bilaterally. Nasal: External nose without lesions. Septum with minimal deformity.. Clear nasal passages bilaterally. Oral: Lips and gums without lesions. Tongue and palate mucosa without lesions. Posterior oropharynx clear. Neck: No palpable adenopathy or masses Respiratory: Breathing comfortably  Skin: No facial/neck lesions or rash noted.  Procedures  Assessment: Dizziness is not consistent with inner ear abnormality such as Mnire's disease or BPPV.  Plan: Briefly reviewed with him concerning options of further evaluation if needed.  If he continues to have recurrent bouts of dizziness could further evaluate inner ear function with VNG testing and audiologic testing however at this time I do not feel like the dizziness is inner ear related. He will follow-up as needed if he has recurrent bouts of dizziness.   Radene Journey, MD   CC:

## 2020-09-17 ENCOUNTER — Encounter: Payer: Self-pay | Admitting: Physician Assistant

## 2020-09-17 ENCOUNTER — Other Ambulatory Visit: Payer: Self-pay

## 2020-09-17 ENCOUNTER — Ambulatory Visit: Payer: 59 | Admitting: Physician Assistant

## 2020-09-17 VITALS — BP 148/88 | HR 72 | Resp 15 | Ht 71.65 in | Wt 224.6 lb

## 2020-09-17 DIAGNOSIS — G8929 Other chronic pain: Secondary | ICD-10-CM

## 2020-09-17 DIAGNOSIS — M25561 Pain in right knee: Secondary | ICD-10-CM

## 2020-09-17 DIAGNOSIS — D804 Selective deficiency of immunoglobulin M [IgM]: Secondary | ICD-10-CM

## 2020-09-17 DIAGNOSIS — R7989 Other specified abnormal findings of blood chemistry: Secondary | ICD-10-CM

## 2020-09-17 DIAGNOSIS — R112 Nausea with vomiting, unspecified: Secondary | ICD-10-CM

## 2020-09-17 DIAGNOSIS — M533 Sacrococcygeal disorders, not elsewhere classified: Secondary | ICD-10-CM

## 2020-09-17 DIAGNOSIS — Z79899 Other long term (current) drug therapy: Secondary | ICD-10-CM

## 2020-09-17 DIAGNOSIS — E782 Mixed hyperlipidemia: Secondary | ICD-10-CM

## 2020-09-17 DIAGNOSIS — Z8719 Personal history of other diseases of the digestive system: Secondary | ICD-10-CM

## 2020-09-17 DIAGNOSIS — M19041 Primary osteoarthritis, right hand: Secondary | ICD-10-CM | POA: Diagnosis not present

## 2020-09-17 DIAGNOSIS — M19071 Primary osteoarthritis, right ankle and foot: Secondary | ICD-10-CM

## 2020-09-17 DIAGNOSIS — M160 Bilateral primary osteoarthritis of hip: Secondary | ICD-10-CM

## 2020-09-17 DIAGNOSIS — M19072 Primary osteoarthritis, left ankle and foot: Secondary | ICD-10-CM

## 2020-09-17 DIAGNOSIS — L4050 Arthropathic psoriasis, unspecified: Secondary | ICD-10-CM | POA: Diagnosis not present

## 2020-09-17 DIAGNOSIS — L409 Psoriasis, unspecified: Secondary | ICD-10-CM | POA: Diagnosis not present

## 2020-09-17 DIAGNOSIS — M1A00X0 Idiopathic chronic gout, unspecified site, without tophus (tophi): Secondary | ICD-10-CM

## 2020-09-17 DIAGNOSIS — M19042 Primary osteoarthritis, left hand: Secondary | ICD-10-CM

## 2020-09-17 DIAGNOSIS — Z840 Family history of diseases of the skin and subcutaneous tissue: Secondary | ICD-10-CM

## 2020-09-17 DIAGNOSIS — M170 Bilateral primary osteoarthritis of knee: Secondary | ICD-10-CM

## 2020-09-17 MED ORDER — LIDOCAINE HCL 1 % IJ SOLN
1.5000 mL | INTRAMUSCULAR | Status: AC | PRN
  Administered 2020-09-17: 1.5 mL

## 2020-09-17 MED ORDER — TRIAMCINOLONE ACETONIDE 40 MG/ML IJ SUSP
40.0000 mg | INTRAMUSCULAR | Status: AC | PRN
  Administered 2020-09-17: 40 mg via INTRA_ARTICULAR

## 2020-09-17 NOTE — Patient Instructions (Signed)
Knee Exercises Ask your health care provider which exercises are safe for you. Do exercises exactly as told by your health care provider and adjust them as directed. It is normal to feel mild stretching, pulling, tightness, or discomfort as you do these exercises. Stop right away if you feel sudden pain or your pain gets worse. Do not begin these exercises until told by your health care provider. Stretching and range-of-motion exercises These exercises warm up your muscles and joints and improve the movement and flexibility of your knee. These exercises also help to relieve pain and swelling. Knee extension, prone 1. Lie on your abdomen (prone position) on a bed. 2. Place your left / right knee just beyond the edge of the surface so your knee is not on the bed. You can put a towel under your left / right thigh just above your kneecap for comfort. 3. Relax your leg muscles and allow gravity to straighten your knee (extension). You should feel a stretch behind your left / right knee. 4. Hold this position for __________ seconds. 5. Scoot up so your knee is supported between repetitions. Repeat __________ times. Complete this exercise __________ times a day. Knee flexion, active 1. Lie on your back with both legs straight. If this causes back discomfort, bend your left / right knee so your foot is flat on the floor. 2. Slowly slide your left / right heel back toward your buttocks. Stop when you feel a gentle stretch in the front of your knee or thigh (flexion). 3. Hold this position for __________ seconds. 4. Slowly slide your left / right heel back to the starting position. Repeat __________ times. Complete this exercise __________ times a day.   Quadriceps stretch, prone 1. Lie on your abdomen on a firm surface, such as a bed or padded floor. 2. Bend your left / right knee and hold your ankle. If you cannot reach your ankle or pant leg, loop a belt around your foot and grab the belt  instead. 3. Gently pull your heel toward your buttocks. Your knee should not slide out to the side. You should feel a stretch in the front of your thigh and knee (quadriceps). 4. Hold this position for __________ seconds. Repeat __________ times. Complete this exercise __________ times a day.   Hamstring, supine 1. Lie on your back (supine position). 2. Loop a belt or towel over the ball of your left / right foot. The ball of your foot is on the walking surface, right under your toes. 3. Straighten your left / right knee and slowly pull on the belt to raise your leg until you feel a gentle stretch behind your knee (hamstring). ? Do not let your knee bend while you do this. ? Keep your other leg flat on the floor. 4. Hold this position for __________ seconds. Repeat __________ times. Complete this exercise __________ times a day. Strengthening exercises These exercises build strength and endurance in your knee. Endurance is the ability to use your muscles for a long time, even after they get tired. Quadriceps, isometric This exercise stretches the muscles in front of your thigh (quadriceps) without moving your knee joint (isometric). 1. Lie on your back with your left / right leg extended and your other knee bent. Put a rolled towel or small pillow under your knee if told by your health care provider. 2. Slowly tense the muscles in the front of your left / right thigh. You should see your kneecap slide up toward your  hip or see increased dimpling just above the knee. This motion will push the back of the knee toward the floor. 3. For __________ seconds, hold the muscle as tight as you can without increasing your pain. 4. Relax the muscles slowly and completely. Repeat __________ times. Complete this exercise __________ times a day.   Straight leg raises This exercise stretches the muscles in front of your thigh (quadriceps) and the muscles that move your hips (hip flexors). 1. Lie on your back  with your left / right leg extended and your other knee bent. 2. Tense the muscles in the front of your left / right thigh. You should see your kneecap slide up or see increased dimpling just above the knee. Your thigh may even shake a bit. 3. Keep these muscles tight as you raise your leg 4-6 inches (10-15 cm) off the floor. Do not let your knee bend. 4. Hold this position for __________ seconds. 5. Keep these muscles tense as you lower your leg. 6. Relax your muscles slowly and completely after each repetition. Repeat __________ times. Complete this exercise __________ times a day. Hamstring, isometric 1. Lie on your back on a firm surface. 2. Bend your left / right knee about __________ degrees. 3. Dig your left / right heel into the surface as if you are trying to pull it toward your buttocks. Tighten the muscles in the back of your thighs (hamstring) to "dig" as hard as you can without increasing any pain. 4. Hold this position for __________ seconds. 5. Release the tension gradually and allow your muscles to relax completely for __________ seconds after each repetition. Repeat __________ times. Complete this exercise __________ times a day. Hamstring curls If told by your health care provider, do this exercise while wearing ankle weights. Begin with __________ lb weights. Then increase the weight by 1 lb (0.5 kg) increments. Do not wear ankle weights that are more than __________ lb. 1. Lie on your abdomen with your legs straight. 2. Bend your left / right knee as far as you can without feeling pain. Keep your hips flat against the floor. 3. Hold this position for __________ seconds. 4. Slowly lower your leg to the starting position. Repeat __________ times. Complete this exercise __________ times a day.   Squats This exercise strengthens the muscles in front of your thigh and knee (quadriceps). 1. Stand in front of a table, with your feet and knees pointing straight ahead. You may rest  your hands on the table for balance but not for support. 2. Slowly bend your knees and lower your hips like you are going to sit in a chair. ? Keep your weight over your heels, not over your toes. ? Keep your lower legs upright so they are parallel with the table legs. ? Do not let your hips go lower than your knees. ? Do not bend lower than told by your health care provider. ? If your knee pain increases, do not bend as low. 3. Hold the squat position for __________ seconds. 4. Slowly push with your legs to return to standing. Do not use your hands to pull yourself to standing. Repeat __________ times. Complete this exercise __________ times a day. Wall slides This exercise strengthens the muscles in front of your thigh and knee (quadriceps). 1. Lean your back against a smooth wall or door, and walk your feet out 18-24 inches (46-61 cm) from it. 2. Place your feet hip-width apart. 3. Slowly slide down the wall or door  until your knees bend __________ degrees. Keep your knees over your heels, not over your toes. Keep your knees in line with your hips. 4. Hold this position for __________ seconds. Repeat __________ times. Complete this exercise __________ times a day.   Straight leg raises This exercise strengthens the muscles that rotate the leg at the hip and move it away from your body (hip abductors). 1. Lie on your side with your left / right leg in the top position. Lie so your head, shoulder, knee, and hip line up. You may bend your bottom knee to help you keep your balance. 2. Roll your hips slightly forward so your hips are stacked directly over each other and your left / right knee is facing forward. 3. Leading with your heel, lift your top leg 4-6 inches (10-15 cm). You should feel the muscles in your outer hip lifting. ? Do not let your foot drift forward. ? Do not let your knee roll toward the ceiling. 4. Hold this position for __________ seconds. 5. Slowly return your leg to the  starting position. 6. Let your muscles relax completely after each repetition. Repeat __________ times. Complete this exercise __________ times a day.   Straight leg raises This exercise stretches the muscles that move your hips away from the front of the pelvis (hip extensors). 1. Lie on your abdomen on a firm surface. You can put a pillow under your hips if that is more comfortable. 2. Tense the muscles in your buttocks and lift your left / right leg about 4-6 inches (10-15 cm). Keep your knee straight as you lift your leg. 3. Hold this position for __________ seconds. 4. Slowly lower your leg to the starting position. 5. Let your leg relax completely after each repetition. Repeat __________ times. Complete this exercise __________ times a day. This information is not intended to replace advice given to you by your health care provider. Make sure you discuss any questions you have with your health care provider. Document Revised: 04/13/2018 Document Reviewed: 04/13/2018 Elsevier Patient Education  2021 [employer].

## 2020-09-18 LAB — HEPATIC FUNCTION PANEL
AG Ratio: 2.2 (calc) (ref 1.0–2.5)
ALT: 34 U/L (ref 9–46)
AST: 40 U/L — ABNORMAL HIGH (ref 10–35)
Albumin: 4.6 g/dL (ref 3.6–5.1)
Alkaline phosphatase (APISO): 32 U/L — ABNORMAL LOW (ref 35–144)
Bilirubin, Direct: 0.2 mg/dL (ref 0.0–0.2)
Globulin: 2.1 g/dL (calc) (ref 1.9–3.7)
Indirect Bilirubin: 0.6 mg/dL (calc) (ref 0.2–1.2)
Total Bilirubin: 0.8 mg/dL (ref 0.2–1.2)
Total Protein: 6.7 g/dL (ref 6.1–8.1)

## 2020-09-18 LAB — URIC ACID: Uric Acid, Serum: 8.3 mg/dL — ABNORMAL HIGH (ref 4.0–8.0)

## 2020-09-18 NOTE — Progress Notes (Signed)
ALT has returned to WNL. AST is borderline elevated-35 but has improved.  Alk phos is slightly low. We will continue to monitor.  Uric acid is elevated-8.3.  He has not had any recent gout flares.  He has not been taking his allopurinol on a daily basis.  Please the patient to take allopurinol 100 mg by mouth daily.

## 2020-09-27 ENCOUNTER — Other Ambulatory Visit (HOSPITAL_BASED_OUTPATIENT_CLINIC_OR_DEPARTMENT_OTHER): Payer: Self-pay

## 2020-10-15 ENCOUNTER — Other Ambulatory Visit (HOSPITAL_COMMUNITY): Payer: Self-pay

## 2020-10-17 ENCOUNTER — Other Ambulatory Visit (HOSPITAL_COMMUNITY): Payer: Self-pay

## 2020-10-17 MED FILL — Ixekizumab Subcutaneous Soln Prefilled Syringe 80 MG/ML: SUBCUTANEOUS | 28 days supply | Qty: 1 | Fill #0 | Status: AC

## 2020-10-23 ENCOUNTER — Encounter: Payer: Self-pay | Admitting: Family Medicine

## 2020-10-23 ENCOUNTER — Other Ambulatory Visit (HOSPITAL_COMMUNITY): Payer: Self-pay

## 2020-11-12 ENCOUNTER — Other Ambulatory Visit (HOSPITAL_COMMUNITY): Payer: Self-pay

## 2020-11-12 ENCOUNTER — Other Ambulatory Visit: Payer: Self-pay | Admitting: Rheumatology

## 2020-11-12 DIAGNOSIS — L409 Psoriasis, unspecified: Secondary | ICD-10-CM

## 2020-11-12 DIAGNOSIS — L4050 Arthropathic psoriasis, unspecified: Secondary | ICD-10-CM

## 2020-11-12 MED ORDER — TALTZ 80 MG/ML ~~LOC~~ SOSY
PREFILLED_SYRINGE | SUBCUTANEOUS | 0 refills | Status: DC
  Filled 2020-11-19: qty 1, 28d supply, fill #0
  Filled 2020-12-12: qty 1, 28d supply, fill #1
  Filled 2021-01-09: qty 1, 28d supply, fill #2

## 2020-11-12 NOTE — Telephone Encounter (Signed)
Next Visit: 02/19/2021  Last Visit: 09/17/2020  Last Fill: 08/17/2020  DX:  Psoriatic arthritis   Current Dose per office note 09/17/2020, Taltz 80 mg sq injections every 4 weeks  Labs: 08/16/2020, CBC is normal, LFTs are elevated. Most likely due to the recent use of antibiotics. Patient should repeat AST and ALT in 1 month. Please forward results to his PCP  LMOM AST/ALT past due.  TB Gold: 05/28/2020, negative  Okay to refill Taltz?

## 2020-11-19 ENCOUNTER — Encounter: Payer: Self-pay | Admitting: Family Medicine

## 2020-11-19 ENCOUNTER — Ambulatory Visit (INDEPENDENT_AMBULATORY_CARE_PROVIDER_SITE_OTHER): Payer: BC Managed Care – PPO | Admitting: Family Medicine

## 2020-11-19 ENCOUNTER — Other Ambulatory Visit (HOSPITAL_COMMUNITY): Payer: Self-pay

## 2020-11-19 ENCOUNTER — Other Ambulatory Visit: Payer: Self-pay

## 2020-11-19 VITALS — BP 142/80 | HR 75 | Temp 97.9°F | Ht 71.65 in | Wt 219.0 lb

## 2020-11-19 DIAGNOSIS — M1000 Idiopathic gout, unspecified site: Secondary | ICD-10-CM | POA: Diagnosis not present

## 2020-11-19 DIAGNOSIS — R7989 Other specified abnormal findings of blood chemistry: Secondary | ICD-10-CM | POA: Diagnosis not present

## 2020-11-19 DIAGNOSIS — Z125 Encounter for screening for malignant neoplasm of prostate: Secondary | ICD-10-CM | POA: Diagnosis not present

## 2020-11-19 DIAGNOSIS — Z23 Encounter for immunization: Secondary | ICD-10-CM | POA: Diagnosis not present

## 2020-11-19 DIAGNOSIS — E782 Mixed hyperlipidemia: Secondary | ICD-10-CM

## 2020-11-19 DIAGNOSIS — Z0000 Encounter for general adult medical examination without abnormal findings: Secondary | ICD-10-CM | POA: Diagnosis not present

## 2020-11-19 DIAGNOSIS — R030 Elevated blood-pressure reading, without diagnosis of hypertension: Secondary | ICD-10-CM

## 2020-11-19 LAB — CBC WITH DIFFERENTIAL/PLATELET
Basophils Absolute: 0 10*3/uL (ref 0.0–0.1)
Basophils Relative: 0.6 % (ref 0.0–3.0)
Eosinophils Absolute: 0.2 10*3/uL (ref 0.0–0.7)
Eosinophils Relative: 3.3 % (ref 0.0–5.0)
HCT: 46.4 % (ref 39.0–52.0)
Hemoglobin: 15.6 g/dL (ref 13.0–17.0)
Lymphocytes Relative: 32.3 % (ref 12.0–46.0)
Lymphs Abs: 1.6 10*3/uL (ref 0.7–4.0)
MCHC: 33.5 g/dL (ref 30.0–36.0)
MCV: 88.7 fl (ref 78.0–100.0)
Monocytes Absolute: 0.6 10*3/uL (ref 0.1–1.0)
Monocytes Relative: 11.7 % (ref 3.0–12.0)
Neutro Abs: 2.6 10*3/uL (ref 1.4–7.7)
Neutrophils Relative %: 52.1 % (ref 43.0–77.0)
Platelets: 289 10*3/uL (ref 150.0–400.0)
RBC: 5.23 Mil/uL (ref 4.22–5.81)
RDW: 14.3 % (ref 11.5–15.5)
WBC: 5 10*3/uL (ref 4.0–10.5)

## 2020-11-19 LAB — COMPREHENSIVE METABOLIC PANEL
ALT: 37 U/L (ref 0–53)
AST: 30 U/L (ref 0–37)
Albumin: 4.8 g/dL (ref 3.5–5.2)
Alkaline Phosphatase: 35 U/L — ABNORMAL LOW (ref 39–117)
BUN: 15 mg/dL (ref 6–23)
CO2: 31 mEq/L (ref 19–32)
Calcium: 9.8 mg/dL (ref 8.4–10.5)
Chloride: 102 mEq/L (ref 96–112)
Creatinine, Ser: 0.98 mg/dL (ref 0.40–1.50)
GFR: 83.25 mL/min (ref >60.00)
Glucose, Bld: 106 mg/dL — ABNORMAL HIGH (ref 70–99)
Potassium: 4.5 mEq/L (ref 3.5–5.1)
Sodium: 142 mEq/L (ref 135–145)
Total Bilirubin: 0.6 mg/dL (ref 0.2–1.2)
Total Protein: 7.1 g/dL (ref 6.0–8.3)

## 2020-11-19 LAB — TSH: TSH: 1.43 u[IU]/mL (ref 0.35–4.50)

## 2020-11-19 LAB — LIPID PANEL
Cholesterol: 169 mg/dL (ref 0–200)
HDL: 64.2 mg/dL (ref >39.00)
LDL Cholesterol: 91 mg/dL (ref 0–99)
NonHDL: 105.07
Total CHOL/HDL Ratio: 3
Triglycerides: 69 mg/dL (ref 0.0–149.0)
VLDL: 13.8 mg/dL (ref 0.0–40.0)

## 2020-11-19 LAB — URIC ACID: Uric Acid, Serum: 7.1 mg/dL (ref 4.0–7.8)

## 2020-11-19 LAB — PSA: PSA: 0.79 ng/mL (ref 0.10–4.00)

## 2020-11-19 LAB — TESTOSTERONE: Testosterone: 233.98 ng/dL — ABNORMAL LOW (ref 300.00–890.00)

## 2020-11-19 MED ORDER — ALLOPURINOL 100 MG PO TABS: 100.0000 mg | ORAL_TABLET | Freq: Every day | ORAL | 3 refills | Status: DC

## 2020-11-19 NOTE — Progress Notes (Signed)
Patient: Jeffrey Klein MRN: [id number] DOB: 06/01/1961 PCP: Orma Flaming, MD     Subjective:  Chief Complaint  Patient presents with  . Annual Exam  . Hyperlipidemia  . low testosterone  . Gout    HPI: The patient is a 60 y.o. male who presents today for annual exam. He denies any changes to past medical history. There have been no recent hospitalizations. They are not following a well balanced diet and exercise plan. Weight has been stable. Complains of spots on both lower legs, and right shoulder. Thinks he was bit by bugs. Small, raised and itchy. Not getting any worse, not spreading. Has not but anything on them.   No FH of breast or colon cancer.   Hyperlipidemia No family hx of CAD/CVA/MI in his family. He is on crestor 10mg /day. He has never smoked. Takes meds as prescribed.  The 10-year ASCVD risk score Mikey Bussing DC Jr., et al., 2013) is: 9.3%   Low testosterone Has long history of low T. Was on androgel x 10 years and insurance will not pay at this time. Needs another lab draw and they may cover. Also did IM testosterone and failed this as his levels could never get stable. Tried PA and insurance wanted another lab done which we will do today.   Hx of gout On allopurinol 100mg  daily. Had not had a flair in years. Takes medication as prescribed.   Immunization History  Administered Date(s) Administered  . PFIZER(Purple Top)SARS-COV-2 Vaccination 10/06/2019, 10/31/2019, 03/05/2020  . Zoster Recombinat (Shingrix) 04/05/2019, 06/17/2019   Colonoscopy: he had at 76 years and then again 2018. Repeat cscope in 5 years.  PSA: today   Review of Systems  Constitutional: Negative for chills, fatigue and fever.  HENT: Negative for dental problem, ear pain, hearing loss and trouble swallowing.   Eyes: Negative for visual disturbance.  Respiratory: Negative for cough, chest tightness and shortness of breath.   Cardiovascular: Negative for chest pain, palpitations and leg  swelling.  Gastrointestinal: Negative for abdominal pain, blood in stool, diarrhea and nausea.  Endocrine: Negative for cold intolerance, polydipsia, polyphagia and polyuria.  Genitourinary: Negative for dysuria and hematuria.  Musculoskeletal: Negative for arthralgias.  Skin: Negative for rash.       Bites   Neurological: Negative for dizziness and headaches.  Psychiatric/Behavioral: Negative for dysphoric mood and sleep disturbance. The patient is not nervous/anxious.     Allergies Patient has No Known Allergies.  Past Medical History Patient  has no past medical history on file.  Surgical History Patient  has a past surgical history that includes Wrist surgery (Right); Tonsillectomy; LASIK (Bilateral, 2006); and Knee arthroscopy (Bilateral).  Family History Pateint's family history includes ADD / ADHD in his son; Drug abuse in his son; Healthy in his daughter; Psoriasis in his paternal grandmother and sister.  Social History Patient  reports that he has never smoked. He has never used smokeless tobacco. He reports current alcohol use of about 5.0 standard drinks of alcohol per week. He reports that he does not use drugs.    Objective: Vitals:   11/19/20 0820 11/19/20 0908  BP: (!) 145/86 (!) 142/80  Pulse: 75   Temp: 97.9 F (36.6 C)   TempSrc: Temporal   SpO2: 97%   Weight: 219 lb (99.3 kg)   Height: 5' 11.65" (1.82 m)     Body mass index is 29.99 kg/m.  Physical Exam Vitals reviewed.  Constitutional:      Appearance: Normal appearance. He is well-developed. He  is obese.  HENT:     Head: Normocephalic and atraumatic.     Right Ear: Tympanic membrane, ear canal and external ear normal.     Left Ear: Tympanic membrane, ear canal and external ear normal.     Nose: Nose normal.     Mouth/Throat:     Mouth: Mucous membranes are moist.  Eyes:     Extraocular Movements: Extraocular movements intact.     Conjunctiva/sclera: Conjunctivae normal.     Pupils: Pupils  are equal, round, and reactive to light.  Neck:     Thyroid: No thyromegaly.     Vascular: No carotid bruit.  Cardiovascular:     Rate and Rhythm: Normal rate and regular rhythm.     Pulses: Normal pulses.     Heart sounds: Normal heart sounds. No murmur heard.   Pulmonary:     Effort: Pulmonary effort is normal.     Breath sounds: Normal breath sounds.  Abdominal:     General: Bowel sounds are normal. There is no distension.     Palpations: Abdomen is soft.     Tenderness: There is no abdominal tenderness.  Musculoskeletal:     Cervical back: Normal range of motion and neck supple.  Lymphadenopathy:     Cervical: No cervical adenopathy.  Skin:    General: Skin is warm and dry.     Capillary Refill: Capillary refill takes less than 2 seconds.     Findings: No rash.     Comments: Small raised papules on his right shoulder and ankles. Appears to be a bite.   Neurological:     General: No focal deficit present.     Mental Status: He is alert and oriented to person, place, and time.     Cranial Nerves: No cranial nerve deficit.     Coordination: Coordination normal.     Deep Tendon Reflexes: Reflexes normal.  Psychiatric:        Mood and Affect: Mood normal.        Behavior: Behavior normal.    [place] Office Visit from 11/19/2020 in [facility]  PHQ-2 Total Score 0         Assessment/plan: 1. Annual physical exam Routine fasting labs today. HM reviewed. Discussed colonoscopy screening and he is going to look for records. Last had done in 2018. Otherwise UTD on his HM. Really encouraged exercise and discussed guidelines of 150 minutes/week. Followed by rheum for his psoriasis.  - CBC with Differential/Platelet - Comprehensive metabolic panel - TSH  2. Hyperlipidemia, mixed  - Lipid panel  3. Screening PSA (prostate specific antigen)  - PSA  4. Low testosterone Recheck today. PA never got approved. Will recheck today and if low may be  able to get covered by insurance. F/u with dr. Jerline Pain in 6 months.  - Testosterone  5. Idiopathic gout, unspecified chronicity, unspecified site Has not had flairs in years, well controlled on his allopurinol.  - Uric acid  6. Need for Tdap vaccination  - Tdap vaccine greater than or equal to 7yo IM  7. Elevated blood pressure reading Elevated today. Discussed exercise, weight loss and low salt diet. He is borderline so will work on lifestyle changes with f/u in 6 month for recheck.   Steroid cream prn for his bug bites. F/u if no improvement.   Return in about 6 months (around 05/22/2021) for low Testosterone and blood pressure f/u with Dr. Jerline Pain and TOC. Ebony Hail  Rogers Blocker, [place]  11/19/2020

## 2020-11-19 NOTE — Patient Instructions (Signed)
-routine labs today -tdap vaccine. Good for 10 years.  -checking testosterone -does look like you have bug bites. Can use your steroid cream twice a day for these x 7 days!   Thanks for letting me take care of you! Will miss you! Dr. Rogers Blocker   Preventive Care 27-62 Years Old, Male Preventive care refers to lifestyle choices and visits with your health care provider that can promote health and wellness. This includes:  A yearly physical exam. This is also called an annual wellness visit.  Regular dental and eye exams.  Immunizations.  Screening for certain conditions.  Healthy lifestyle choices, such as: ? Eating a healthy diet. ? Getting regular exercise. ? Not using drugs or products that contain nicotine and tobacco. ? Limiting alcohol use. What can I expect for my preventive care visit? Physical exam Your health care provider will check your:  Height and weight. These may be used to calculate your BMI (body mass index). BMI is a measurement that tells if you are at a healthy weight.  Heart rate and blood pressure.  Body temperature.  Skin for abnormal spots. Counseling Your health care provider may ask you questions about your:  Past medical problems.  Family's medical history.  Alcohol, tobacco, and drug use.  Emotional well-being.  Home life and relationship well-being.  Sexual activity.  Diet, exercise, and sleep habits.  Work and work Statistician.  Access to firearms. What immunizations do I need? Vaccines are usually given at various ages, according to a schedule. Your health care provider will recommend vaccines for you based on your age, medical history, and lifestyle or other factors, such as travel or where you work.   What tests do I need? Blood tests  Lipid and cholesterol levels. These may be checked every 5 years, or more often if you are over 15 years old.  Hepatitis C test.  Hepatitis B test. Screening  Lung cancer screening. You may  have this screening every year starting at age 23 if you have a 30-pack-year history of smoking and currently smoke or have quit within the past 15 years.  Prostate cancer screening. Recommendations will vary depending on your family history and other risks.  Genital exam to check for testicular cancer or hernias.  Colorectal cancer screening. ? All adults should have this screening starting at age 35 and continuing until age 9. ? Your health care provider may recommend screening at age 51 if you are at increased risk. ? You will have tests every 1-10 years, depending on your results and the type of screening test.  Diabetes screening. ? This is done by checking your blood sugar (glucose) after you have not eaten for a while (fasting). ? You may have this done every 1-3 years.  STD (sexually transmitted disease) testing, if you are at risk. Follow these instructions at home: Eating and drinking  Eat a diet that includes fresh fruits and vegetables, whole grains, lean protein, and low-fat dairy products.  Take vitamin and mineral supplements as recommended by your health care provider.  Do not drink alcohol if your health care provider tells you not to drink.  If you drink alcohol: ? Limit how much you have to 0-2 drinks a day. ? Be aware of how much alcohol is in your drink. In the [place] one drink equals one 12 oz bottle of beer (355 mL), one 5 oz glass of wine (148 mL), or one 1 oz glass of hard liquor (44 mL).   Lifestyle  Take daily care of your teeth and gums. Brush your teeth every morning and night with fluoride toothpaste. Floss one time each day.  Stay active. Exercise for at least 30 minutes 5 or more days each week.  Do not use any products that contain nicotine or tobacco, such as cigarettes, e-cigarettes, and chewing tobacco. If you need help quitting, ask your health care provider.  Do not use drugs.  If you are sexually active, practice safe sex. Use a condom or  other form of protection to prevent STIs (sexually transmitted infections).  If told by your health care provider, take low-dose aspirin daily starting at age 68.  Find healthy ways to cope with stress, such as: ? Meditation, yoga, or listening to music. ? Journaling. ? Talking to a trusted person. ? Spending time with friends and family. Safety  Always wear your seat belt while driving or riding in a vehicle.  Do not drive: ? If you have been drinking alcohol. Do not ride with someone who has been drinking. ? When you are tired or distracted. ? While texting.  Wear a helmet and other protective equipment during sports activities.  If you have firearms in your house, make sure you follow all gun safety procedures. What's next?  Go to your health care provider once a year for an annual wellness visit.  Ask your health care provider how often you should have your eyes and teeth checked.  Stay up to date on all vaccines. This information is not intended to replace advice given to you by your health care provider. Make sure you discuss any questions you have with your health care provider. Document Revised: 03/22/2019 Document Reviewed: 06/17/2018 Elsevier Patient Education  2021 [employer].

## 2020-11-20 ENCOUNTER — Other Ambulatory Visit (HOSPITAL_COMMUNITY): Payer: Self-pay

## 2020-11-21 ENCOUNTER — Other Ambulatory Visit: Payer: Self-pay | Admitting: Family Medicine

## 2020-11-21 MED ORDER — TESTOSTERONE 12.5 MG/ACT (1%) TD GEL: 50.0000 mg | Freq: Every day | TRANSDERMAL | 3 refills | Status: DC

## 2020-11-22 ENCOUNTER — Other Ambulatory Visit (HOSPITAL_COMMUNITY): Payer: Self-pay

## 2020-11-22 ENCOUNTER — Other Ambulatory Visit (HOSPITAL_BASED_OUTPATIENT_CLINIC_OR_DEPARTMENT_OTHER): Payer: Self-pay

## 2020-11-26 ENCOUNTER — Encounter: Payer: Self-pay | Admitting: Family Medicine

## 2020-12-11 ENCOUNTER — Other Ambulatory Visit (HOSPITAL_COMMUNITY): Payer: Self-pay

## 2020-12-12 ENCOUNTER — Other Ambulatory Visit (HOSPITAL_COMMUNITY): Payer: Self-pay

## 2020-12-17 ENCOUNTER — Other Ambulatory Visit (HOSPITAL_COMMUNITY): Payer: Self-pay

## 2020-12-19 ENCOUNTER — Encounter: Payer: Self-pay | Admitting: Family Medicine

## 2020-12-20 NOTE — Telephone Encounter (Signed)
Spoke with patient he needs a PA done in order to get Testosterone Gel Approved, No PA previous done, last low testosterone lab done 11/19/20. Please advise

## 2021-01-09 ENCOUNTER — Other Ambulatory Visit (HOSPITAL_COMMUNITY): Payer: Self-pay

## 2021-01-14 ENCOUNTER — Other Ambulatory Visit (HOSPITAL_COMMUNITY): Payer: Self-pay

## 2021-01-15 ENCOUNTER — Telehealth: Payer: Self-pay

## 2021-01-15 NOTE — Telephone Encounter (Signed)
Prior Auth was done 09/05/2020, PA was denied, Pt needs (2) low morning readings. Testosterone reading was normal on 11/18/2019. Testosterone was low on 11/19/2020. Pt was advised 09/17/2020 in Patient Messages. Pt has not provided any further labs with low testosterone readings. Prior Auth clarifies 12.5 (1 percent) gel.

## 2021-01-16 NOTE — Telephone Encounter (Signed)
Lvm for the pt to call the office back. 

## 2021-01-16 NOTE — Telephone Encounter (Signed)
Please let patient know. Ok to order another lab if he wishes.  Algis Greenhouse. Jerline Pain, MD 01/16/2021 8:54 AM

## 2021-01-28 ENCOUNTER — Ambulatory Visit: Payer: BC Managed Care – PPO | Admitting: Family Medicine

## 2021-01-28 ENCOUNTER — Encounter: Payer: Self-pay | Admitting: Family Medicine

## 2021-01-28 ENCOUNTER — Other Ambulatory Visit: Payer: Self-pay

## 2021-01-28 VITALS — BP 126/75 | HR 72 | Temp 98.2°F | Ht 71.0 in | Wt 223.4 lb

## 2021-01-28 DIAGNOSIS — E782 Mixed hyperlipidemia: Secondary | ICD-10-CM

## 2021-01-28 DIAGNOSIS — R7989 Other specified abnormal findings of blood chemistry: Secondary | ICD-10-CM | POA: Diagnosis not present

## 2021-01-28 DIAGNOSIS — L4050 Arthropathic psoriasis, unspecified: Secondary | ICD-10-CM | POA: Diagnosis not present

## 2021-01-28 DIAGNOSIS — M1000 Idiopathic gout, unspecified site: Secondary | ICD-10-CM

## 2021-01-28 DIAGNOSIS — K5790 Diverticulosis of intestine, part unspecified, without perforation or abscess without bleeding: Secondary | ICD-10-CM

## 2021-01-28 MED ORDER — ROSUVASTATIN CALCIUM 10 MG PO TABS: 10.0000 mg | ORAL_TABLET | Freq: Every day | ORAL | 1 refills | Status: DC

## 2021-01-28 NOTE — Assessment & Plan Note (Signed)
No recent flares.  Continue allopurinol 100 mg daily.

## 2021-01-28 NOTE — Progress Notes (Signed)
Jeffrey Klein is a 60 y.o. male who presents today for an office visit. He is transferring care.   Assessment/Plan:  New/Acute Problems: Piriformis syndrome No red flags.  Likely the source of his buttocks pain and radicular symptoms.  Discussed home exercises and handout was given.  He will let me know if not improving and we can refer to sports medicine/for PT.  Chronic Problems Addressed Today: Psoriatic arthritis ([place]) Follows with rheumatology.  On Toltz and tolerating well.  Diverticulosis No recent flares.  Had colonoscopy in 2018 will need repeat next year.  Low testosterone On testosterone gel 50 mg daily.  He  will come back in 6 months to recheck labs.  Hyperlipidemia, mixed Last lipid panel at goal.  Continue Crestor 10 mg daily.  Gout No recent flares.  Continue allopurinol 100 mg daily.     Subjective:  HPI: See A/p for status of chronic conditions.   He has a few additional concerns today.  If he is walking somewhere, he feels buttocks muscles pulling/strain which is very painful. Also, he does experience pain down the back of his legs. In addition, where the hip joint socket is he feels pain which he suspects is arthritis.  Last colonoscopy he had in 2018 found diverticulosis, and because of this he is wondering on the use of Cologuard.   He denies pain with inward movement with his lower legs, in relation to his arthritis. He does admit that he used to experience pain here however.  Current medications he is using work very effectively to alleviate their associated diseases. He is compliant with all medications with no notable side effects.   PMH:  The following were reviewed and entered/updated in epic: History reviewed. No pertinent past medical history. 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   Gout 11/18/2019   Hyperlipidemia, mixed 11/18/2019   Low testosterone 11/18/2019   Diverticulosis 11/18/2019   Psoriatic arthritis ([place]) 11/18/2019   Past Surgical History:  Procedure Laterality Date   KNEE ARTHROSCOPY Bilateral    LASIK Bilateral 2006   TONSILLECTOMY     as a child    WRIST SURGERY Right     Family History  Problem Relation Age of Onset   Psoriasis Sister    Psoriasis Paternal Grandmother    Healthy Daughter    ADD / ADHD Son    Drug abuse Son     Medications- reviewed and updated Current Outpatient Medications  Medication Sig Dispense Refill   allopurinol (ZYLOPRIM) 100 MG tablet Take 1 tablet (100 mg total) by mouth daily. 90 tablet 3   ASPIRIN 81 PO Take by mouth daily.     betamethasone valerate (VALISONE) 0.1 % cream Apply 1 application topically as needed.     Omega-3 Fatty Acids (FISH OIL PO) Take by mouth daily.     TALTZ 80 MG/ML SOSY INJECT 80 MG INTO THE SKIN EVERY 28 (TWENTY-EIGHT) DAYS 3 mL 0   Testosterone 12.5 MG/ACT (1%) GEL Place 50 mg onto the skin daily. 75 g 3   VITAMIN D PO Take by mouth daily.     rosuvastatin (CRESTOR) 10 MG tablet Take 1 tablet (10 mg total) by mouth at bedtime. 90 tablet  1   No current facility-administered medications for this visit.    Allergies-reviewed and updated No Known Allergies  Social History   Socioeconomic History   Marital status: Divorced    Spouse name: Not on file   Number of children: Not on file   Years of education: Not on file   Highest education level: Not on file  Occupational History   Not on file  Tobacco Use   Smoking status: Never   Smokeless tobacco: Never  Vaping Use   Vaping Use: Never used  Substance and Sexual Activity   Alcohol use: Yes    Alcohol/week: 5.0 standard drinks    Types: 1 Glasses of wine, 4 Cans of beer per week     Comment: occ   Drug use: Never   Sexual activity: Not on file  Other Topics Concern   Not on file  Social History Narrative   Not on file   Social Determinants of Health   Financial Resource Strain: Not on file  Food Insecurity: Not on file  Transportation Needs: Not on file  Physical Activity: Not on file  Stress: Not on file  Social Connections: Not on file           Objective:  Physical Exam: BP 126/75   Pulse 72   Temp 98.2 F (36.8 C) (Temporal)   Ht '5\' 11"'$  (1.803 m)   Wt 223 lb 6.4 oz (101.3 kg)   SpO2 98%   BMI 31.16 kg/m   Gen: No acute distress, resting comfortably CV: Regular rate and rhythm with no murmurs appreciated Pulm: Normal work of breathing, clear to auscultation bilaterally with no crackles, wheezes, or rhonchi MSK: Back without deformities.  Bilateral lower extremities without deformity.  Slightly limited internal and external rotation of bilateral hips.  Neurovascular intact distally. Neuro: Grossly normal, moves all extremities Psych: Normal affect and thought content      I,Jordan Kelly,acting as a scribe for Dimas Chyle, MD.,have documented all relevant documentation on the behalf of Dimas Chyle, MD,as directed by  Dimas Chyle, MD while in the presence of Dimas Chyle, MD.  I, Dimas Chyle, MD, have reviewed all documentation for this visit. The documentation on 01/28/21 for the exam, diagnosis, procedures, and orders are all accurate and complete.  Time Spent: 42 minutes of total time was spent on the date of the encounter performing the following actions: chart review prior to seeing the patient including recent visits with previous PCP and specialists, obtaining history, performing a medically necessary exam, counseling on the treatment plan, placing orders, and documenting in our EHR.    Algis Greenhouse. Jerline Pain, MD 01/28/2021 9:32 AM

## 2021-01-28 NOTE — Assessment & Plan Note (Signed)
No recent flares.  Had colonoscopy in 2018 will need repeat next year.

## 2021-01-28 NOTE — Patient Instructions (Signed)
It was very nice to see you today!  Keep up the good work!  Please work on the exercises for your back.  I will see you back in 6 months.  Please come back to see me sooner if needed.  Take care, Dr Jerline Pain  PLEASE NOTE:  If you had any lab tests please let us know if you have not heard back within a few days. You may see your results on mychart before we have a chance to review them but we will give you a call once they are reviewed by [place]. If we ordered any referrals today, please let us know if you have not heard from their office within the next week.   Please try these tips to maintain a healthy lifestyle:  Eat at least 3 REAL meals and 1-2 snacks per day.  Aim for no more than 5 hours between eating.  If you eat breakfast, please do so within one hour of getting up.   Each meal should contain half fruits/vegetables, one quarter protein, and one quarter carbs (no bigger than a computer mouse)  Cut down on sweet beverages. This includes juice, soda, and sweet tea.   Drink at least 1 glass of water with each meal and aim for at least 8 glasses per day  Exercise at least 150 minutes every week.

## 2021-01-28 NOTE — Assessment & Plan Note (Signed)
Last lipid panel at goal.  Continue Crestor 10 mg daily.

## 2021-01-28 NOTE — Assessment & Plan Note (Signed)
Follows with rheumatology.  On Toltz and tolerating well.

## 2021-01-28 NOTE — Assessment & Plan Note (Signed)
On testosterone gel 50 mg daily.  He  will come back in 6 months to recheck labs.

## 2021-02-05 NOTE — Progress Notes (Signed)
Office Visit Note  Patient: Jeffrey Klein             Date of Birth: 07/13/1960           MRN: [id number]             PCP: Vivi Barrack, MD Referring: Orma Flaming, MD Visit Date: 02/19/2021 Occupation: '@GUAROCC'$ @  Subjective:  Joint pain and medication management.   History of Present Illness: Davinder Migneault is a 60 y.o. male with a history of psoriatic arthritis, psoriasis and osteoarthritis.  He states he did not have much improvement after the right knee joint cortisone injection.  After that he started taking glucosamine and chondroitin sulfate.  He has noticed improvement in his right knee joint.  He states he continues to have some stiffness in his hands, hips, knees and lower back but no joint swelling.  He has been taking Toltz on a regular basis.  He states he was not taking allopurinol on a regular basis.  But recently he started taking allopurinol on a regular basis.  He drinks alcohol occasionally.  He has also noted a dark pigmented lesion on his right index finger which he is concerned about.  Activities of Daily Living:  Patient reports morning stiffness for less than 1 minute.   Patient Denies nocturnal pain.  Difficulty dressing/grooming: Reports Difficulty climbing stairs: Denies Difficulty getting out of chair: Denies Difficulty using hands for taps, buttons, cutlery, and/or writing: Denies  Review of Systems  Constitutional:  Negative for fatigue.  HENT:  Negative for mouth sores, mouth dryness and nose dryness.   Eyes:  Negative for pain, itching and dryness.  Respiratory:  Negative for shortness of breath and difficulty breathing.   Cardiovascular:  Negative for chest pain and palpitations.  Gastrointestinal:  Negative for blood in stool, constipation and diarrhea.  Endocrine: Negative for increased urination.  Genitourinary:  Negative for difficulty urinating.  Musculoskeletal:  Positive for joint pain, joint pain and morning stiffness. Negative for  joint swelling, myalgias, muscle tenderness and myalgias.  Skin:  Positive for rash. Negative for color change.  Allergic/Immunologic: Negative for susceptible to infections.  Neurological:  Positive for numbness. Negative for dizziness, headaches, memory loss and weakness.  Hematological:  Negative for bruising/bleeding tendency.  Psychiatric/Behavioral:  Negative for confusion.    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 No pertinent past medical history.  Family History  Problem Relation Age of Onset   Psoriasis Sister    Psoriasis Paternal Grandmother    Healthy Daughter    ADD / ADHD Son    Drug abuse Son    Past Surgical History:  Procedure Laterality Date   KNEE ARTHROSCOPY Bilateral    LASIK Bilateral 2006   TONSILLECTOMY     as a child    WRIST SURGERY Right    Social History   Social History Narrative   Not on file   Immunization History  Administered Date(s) Administered   PFIZER(Purple Top)SARS-COV-2 Vaccination 10/06/2019, 10/31/2019, 03/05/2020   Tdap 11/19/2020  Zoster Recombinat (Shingrix) 04/05/2019, 06/17/2019     Objective: Vital Signs: BP (!) 151/88 (BP Location: Right Arm, Patient Position: Sitting, Cuff Size: Normal)   Pulse 81   Ht '5\' 11"'$  (1.803 m)   Wt 226 lb 3.2 oz (102.6 kg)   BMI 31.55 kg/m    Physical Exam Vitals and nursing note reviewed.  Constitutional:      Appearance: He is well-developed.  HENT:     Head: Normocephalic and atraumatic.  Eyes:      Conjunctiva/sclera: Conjunctivae normal.     Pupils: Pupils are equal, round, and reactive to light.  Cardiovascular:     Rate and Rhythm: Normal rate and regular rhythm.     Heart sounds: Normal heart sounds.  Pulmonary:     Effort: Pulmonary effort is normal.     Breath sounds: Normal breath sounds.  Abdominal:     General: Bowel sounds are normal.     Palpations: Abdomen is soft.  Musculoskeletal:     Cervical back: Normal range of motion and neck supple.  Skin:    General: Skin is warm and dry.     Capillary Refill: Capillary refill takes less than 2 seconds.     Comments: An irregular hyperpigmented 2 mm lesion was noted over the base of his right second finger nail bed   Neurological:     Mental Status: He is alert and oriented to person, place, and time.  Psychiatric:        Behavior: Behavior normal.     Musculoskeletal Exam: C-spine was in good range of motion.  Shoulder joints, elbow joints, wrist joints with good range of motion.  He had bilateral PIP and DIP thickening.  Hip joints, knee joints with good range of motion.  There was no tenderness over ankles or MTPs.  CDAI Exam: CDAI Score: -- Patient Global: --; Provider Global: -- Swollen: --; Tender: -- Joint Exam 02/19/2021   No joint exam has been documented for this visit   There is currently no information documented on the homunculus. Go to the Rheumatology activity and complete the homunculus joint exam.  Investigation: No additional findings.  Imaging: No results found.  Recent Labs: Lab Results  Component Value Date   WBC 5.0 11/19/2020   HGB 15.6 11/19/2020   PLT 289.0 11/19/2020   NA 142 11/19/2020   K 4.5 11/19/2020   CL 102 11/19/2020   CO2 31 11/19/2020   GLUCOSE 106 (H) 11/19/2020   BUN 15 11/19/2020   CREATININE 0.98 11/19/2020   BILITOT 0.6 11/19/2020   ALKPHOS 35 (L) 11/19/2020   AST 30 11/19/2020   ALT 37 11/19/2020   PROT 7.1 11/19/2020   ALBUMIN 4.8 11/19/2020   CALCIUM 9.8  11/19/2020   GFRAA 107 08/16/2020   QFTBGOLDPLUS NEGATIVE 05/28/2020    Speciality Comments: Inadequate response to Enbrel, Stelara, Cosentyx. He has been on [place] for 6 years.  Procedures:  No procedures performed Allergies: Patient has no known allergies.   Assessment / Plan:     Visit Diagnoses: Psoriatic arthritis (H[facility])-patient had no synovitis on examination.  Psoriasis - Diagnosed in 1985.  He had no active psoriasis lesions today.  High risk medication use - Taltz 80 mg sq injections every 4 weeks-started 09/23/16-initially prescribed by rheum in [place]. Inadequate response to Enbrel, Stelara, and Cosentyx.   - Plan: CBC with Differential/Platelet, COMPLETE METABOLIC PANEL WITH GFR, today and then every 3 months.  QuantiFERON-TB Gold Plus in 3 months with uric acid level.  He has been advised to stop Taltz in case he develops an infection and restart after infection resolves.  Information regarding updated immunization was placed in the AVS.  Primary osteoarthritis of both hands-he had bilateral PIP and DIP thickening with no synovitis.  Chronic SI joint pain-he continues to have some SI joint discomfort.  Stretching exercises were demonstrated in the office.  Primary osteoarthritis of both hips-he complains of ongoing discomfort in her hip joints.  Lower extremity exercises were discussed.  Primary osteoarthritis of both knees-he had stiffness in his bilateral knee joints.  He states that he is doing better on chondroitin glucosamine supplement.  Primary osteoarthritis of both feet-no synovitis was noted.  Idiopathic chronic gout without tophus, unspecified site - allopurinol 100 mg 1 tablet by mouth daily. uric acid: 11/19/2020 7.1 - Plan: Uric acid with next labs  Hyperpigmented skin lesion - Hyperpigmented skin lesion was noted on the right index finger nailbed.  I will refer him to dermatology for evaluation. 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    Follow-Up Instructions: Return in about 5 months (around 07/22/2021) for Psoriatic arthritis.   Bo Merino, MD  Note - This record has been created using Editor, commissioning.  Chart creation errors have been sought, but may not always  have been located. Such creation errors do not reflect on  the standard of medical care.

## 2021-02-11 ENCOUNTER — Other Ambulatory Visit (HOSPITAL_COMMUNITY): Payer: Self-pay

## 2021-02-11 ENCOUNTER — Other Ambulatory Visit: Payer: Self-pay | Admitting: Physician Assistant

## 2021-02-11 DIAGNOSIS — L409 Psoriasis, unspecified: Secondary | ICD-10-CM

## 2021-02-11 DIAGNOSIS — L4050 Arthropathic psoriasis, unspecified: Secondary | ICD-10-CM

## 2021-02-11 MED ORDER — TALTZ 80 MG/ML ~~LOC~~ SOSY
PREFILLED_SYRINGE | SUBCUTANEOUS | 0 refills | Status: DC
  Filled 2021-02-11: qty 1, 28d supply, fill #0
  Filled 2021-03-08: qty 1, 28d supply, fill #1
  Filled 2021-04-10: qty 1, 28d supply, fill #2

## 2021-02-11 NOTE — Telephone Encounter (Signed)
Next Visit: 02/19/2021   Last Visit: 09/17/2020   Last Fill: 11/12/2020  DX:  Psoriatic arthritis    Current Dose per office note 09/17/2020, Taltz 80 mg sq injections every 4 weeks   Labs: 11/19/2020 Glucose 106, Alk. Phos 35  TB Gold: 05/28/2020, negative  Noted patient's upcoming appointment to update CBC/CMP  Okay to refill Taltz?

## 2021-02-14 ENCOUNTER — Other Ambulatory Visit (HOSPITAL_COMMUNITY): Payer: Self-pay

## 2021-02-19 ENCOUNTER — Other Ambulatory Visit: Payer: Self-pay

## 2021-02-19 ENCOUNTER — Ambulatory Visit: Payer: BC Managed Care – PPO | Admitting: Rheumatology

## 2021-02-19 ENCOUNTER — Encounter: Payer: Self-pay | Admitting: Rheumatology

## 2021-02-19 VITALS — BP 151/88 | HR 81 | Ht 71.0 in | Wt 226.2 lb

## 2021-02-19 DIAGNOSIS — L409 Psoriasis, unspecified: Secondary | ICD-10-CM | POA: Diagnosis not present

## 2021-02-19 DIAGNOSIS — M25561 Pain in right knee: Secondary | ICD-10-CM

## 2021-02-19 DIAGNOSIS — Z840 Family history of diseases of the skin and subcutaneous tissue: Secondary | ICD-10-CM

## 2021-02-19 DIAGNOSIS — L4050 Arthropathic psoriasis, unspecified: Secondary | ICD-10-CM

## 2021-02-19 DIAGNOSIS — M19041 Primary osteoarthritis, right hand: Secondary | ICD-10-CM

## 2021-02-19 DIAGNOSIS — M170 Bilateral primary osteoarthritis of knee: Secondary | ICD-10-CM

## 2021-02-19 DIAGNOSIS — M19072 Primary osteoarthritis, left ankle and foot: Secondary | ICD-10-CM

## 2021-02-19 DIAGNOSIS — Z8719 Personal history of other diseases of the digestive system: Secondary | ICD-10-CM

## 2021-02-19 DIAGNOSIS — R7989 Other specified abnormal findings of blood chemistry: Secondary | ICD-10-CM

## 2021-02-19 DIAGNOSIS — M19042 Primary osteoarthritis, left hand: Secondary | ICD-10-CM

## 2021-02-19 DIAGNOSIS — D804 Selective deficiency of immunoglobulin M [IgM]: Secondary | ICD-10-CM

## 2021-02-19 DIAGNOSIS — Z79899 Other long term (current) drug therapy: Secondary | ICD-10-CM | POA: Diagnosis not present

## 2021-02-19 DIAGNOSIS — M533 Sacrococcygeal disorders, not elsewhere classified: Secondary | ICD-10-CM

## 2021-02-19 DIAGNOSIS — M1A00X0 Idiopathic chronic gout, unspecified site, without tophus (tophi): Secondary | ICD-10-CM

## 2021-02-19 DIAGNOSIS — M160 Bilateral primary osteoarthritis of hip: Secondary | ICD-10-CM

## 2021-02-19 DIAGNOSIS — M19071 Primary osteoarthritis, right ankle and foot: Secondary | ICD-10-CM

## 2021-02-19 DIAGNOSIS — G8929 Other chronic pain: Secondary | ICD-10-CM

## 2021-02-19 DIAGNOSIS — E782 Mixed hyperlipidemia: Secondary | ICD-10-CM

## 2021-02-19 DIAGNOSIS — L819 Disorder of pigmentation, unspecified: Secondary | ICD-10-CM

## 2021-02-19 LAB — CBC WITH DIFFERENTIAL/PLATELET
Absolute Monocytes: 608 cells/uL (ref 200–950)
Basophils Absolute: 31 cells/uL (ref 0–200)
Basophils Relative: 0.5 %
Eosinophils Absolute: 192 cells/uL (ref 15–500)
Eosinophils Relative: 3.1 %
HCT: 48.1 % (ref 38.5–50.0)
Hemoglobin: 15.7 g/dL (ref 13.2–17.1)
Lymphs Abs: 1928 cells/uL (ref 850–3900)
MCH: 29.7 pg (ref 27.0–33.0)
MCHC: 32.6 g/dL (ref 32.0–36.0)
MCV: 90.9 fL (ref 80.0–100.0)
MPV: 9.6 fL (ref 7.5–12.5)
Monocytes Relative: 9.8 %
Neutro Abs: 3441 cells/uL (ref 1500–7800)
Neutrophils Relative %: 55.5 %
Platelets: 304 10*3/uL (ref 140–400)
RBC: 5.29 10*6/uL (ref 4.20–5.80)
RDW: 13 % (ref 11.0–15.0)
Total Lymphocyte: 31.1 %
WBC: 6.2 10*3/uL (ref 3.8–10.8)

## 2021-02-19 LAB — COMPLETE METABOLIC PANEL WITH GFR
AG Ratio: 2.1 (calc) (ref 1.0–2.5)
ALT: 32 U/L (ref 9–46)
AST: 25 U/L (ref 10–35)
Albumin: 4.7 g/dL (ref 3.6–5.1)
Alkaline phosphatase (APISO): 32 U/L — ABNORMAL LOW (ref 35–144)
BUN: 17 mg/dL (ref 7–25)
CO2: 30 mmol/L (ref 20–32)
Calcium: 9.6 mg/dL (ref 8.6–10.3)
Chloride: 103 mmol/L (ref 98–110)
Creat: 1.05 mg/dL (ref 0.70–1.35)
Globulin: 2.2 g/dL (calc) (ref 1.9–3.7)
Glucose, Bld: 96 mg/dL (ref 65–99)
Potassium: 4 mmol/L (ref 3.5–5.3)
Sodium: 140 mmol/L (ref 135–146)
Total Bilirubin: 0.4 mg/dL (ref 0.2–1.2)
Total Protein: 6.9 g/dL (ref 6.1–8.1)
eGFR: 81 mL/min/{1.73_m2} (ref >=60)

## 2021-02-19 NOTE — Patient Instructions (Signed)
Standing Labs We placed an order today for your standing lab work.   Please have your standing labs drawn in November and every 3 months  If possible, please have your labs drawn 2 weeks prior to your appointment so that the provider can discuss your results at your appointment.  Please note that you may see your imaging and lab results in [facility] before we have reviewed them. We may be awaiting multiple results to interpret others before contacting you. Please allow our office up to 72 hours to thoroughly review all of the results before contacting the office for clarification of your results.  We have open lab daily: Monday through Thursday from 1:30-4:30 PM and Friday from 1:30-4:00 PM at the office of Dr. Bo Merino, [facility].   Please be advised, all patients with office appointments requiring lab work will take precedent over walk-in lab work.  If possible, please come for your lab work on Monday and Friday afternoons, as you may experience shorter wait times. The office is located at [address] [place], [place],  [address] No appointment is necessary.   Labs are drawn by [facility]. Please bring your co-pay at the time of your lab draw.  You may receive a bill from [facility] for your lab work.  If you wish to have your labs drawn at another location, please call the office 24 hours in advance to send orders.  If you have any questions regarding directions or hours of operation,  please call [phone].   As a reminder, please drink plenty of water prior to coming for your lab work. Thanks!    Vaccines You are taking a medication(s) that can suppress your immune system.  The following immunizations are recommended: Flu annually Covid-19  Td/Tdap (tetanus, diphtheria, pertussis) every 10 years Pneumonia (Prevnar 15 then Pneumovax 23 at least 1 year apart.  Alternatively, can take Prevnar 20 without needing additional dose) Shingrix (after age 68): 2  doses from 4 weeks to 6 months apart  Please check with your PCP to make sure you are up to date.    If you test POSITIVE for COVID19 and have MILD to MODERATE symptoms: First, call your PCP if you would like to receive COVID19 treatment AND Hold your medications during the infection and for at least 1 week after your symptoms have resolved: Injectable medication (Benlysta, Cimzia, Cosentyx, Enbrel, Humira, Orencia, Remicade, Simponi, Stelara, Taltz, Tremfya) Methotrexate Leflunomide (Arava) Azathioprine Mycophenolate (Cellcept) Roma Kayser, or Rinvoq Otezla If you take Actemra or Kevzara, you DO NOT need to hold these for COVID19 infection.  If you test POSITIVE for COVID19 and have NO symptoms: First, call your PCP if you would like to receive COVID19 treatment AND Hold your medications for at least 10 days after the day that you tested positive Injectable medication (Benlysta, Cimzia, Cosentyx, Enbrel, Humira, Orencia, Remicade, Simponi, Stelara, Taltz, Tremfya) Methotrexate Leflunomide (Arava) Azathioprine Mycophenolate (Cellcept) Roma Kayser, or Rinvoq Otezla If you take Actemra or Kevzara, you DO NOT need to hold these for COVID19 infection.  If you have signs or symptoms of an infection or start antibiotics: First, call your PCP for workup of your infection. Hold your medication through the infection, until you complete your antibiotics, and until symptoms resolve if you take the following: Injectable medication (Actemra, Benlysta, Cimzia, Cosentyx, Enbrel, Humira, Kevzara, Orencia, Remicade, Simponi, Stelara, Taltz, Tremfya) Methotrexate Leflunomide (Arava) Mycophenolate (Cellcept) Roma Kayser, or Rinvoq  Heart Disease Prevention   Your inflammatory disease increases your  risk of heart disease which includes heart attack, stroke, atrial fibrillation (irregular heartbeats), high blood pressure, heart failure and atherosclerosis (plaque in the arteries).   It is important to reduce your risk by:   Keep blood pressure, cholesterol, and blood sugar at healthy levels   Smoking Cessation   Maintain a healthy weight  BMI 20-25   Eat a healthy diet  Plenty of fresh fruit, vegetables, and whole grains  Limit saturated fats, foods high in sodium, and added sugars  DASH and Mediterranean diet   Increase physical activity  Recommend moderate physically activity for 150 minutes per week/ 30 minutes a day for five days a week These can be broken up into three separate ten-minute sessions during the day.   Reduce Stress  Meditation, slow breathing exercises, yoga, coloring books  Dental visits twice a year

## 2021-02-20 NOTE — Progress Notes (Signed)
CBC and CMP are normal.

## 2021-03-04 ENCOUNTER — Encounter: Payer: Self-pay | Admitting: Dermatology

## 2021-03-04 ENCOUNTER — Other Ambulatory Visit: Payer: Self-pay

## 2021-03-04 ENCOUNTER — Ambulatory Visit: Payer: BC Managed Care – PPO | Admitting: Dermatology

## 2021-03-04 DIAGNOSIS — L918 Other hypertrophic disorders of the skin: Secondary | ICD-10-CM

## 2021-03-04 DIAGNOSIS — D1800 Hemangioma unspecified site: Secondary | ICD-10-CM

## 2021-03-04 DIAGNOSIS — L821 Other seborrheic keratosis: Secondary | ICD-10-CM | POA: Diagnosis not present

## 2021-03-04 DIAGNOSIS — Z1283 Encounter for screening for malignant neoplasm of skin: Secondary | ICD-10-CM | POA: Diagnosis not present

## 2021-03-08 ENCOUNTER — Other Ambulatory Visit (HOSPITAL_COMMUNITY): Payer: Self-pay

## 2021-03-11 ENCOUNTER — Encounter: Payer: Self-pay | Admitting: Dermatology

## 2021-03-11 NOTE — Progress Notes (Addendum)
   New Patient Visit   Subjective  Jeffrey Klein is a 60 y.o. male who presents for the following: Annual Exam (Lesion on back- wants checked , right cheek- "home surgery" ).  General skin examination, check spot on upper back. Location:  Duration:  Quality:  Associated Signs/Symptoms: Modifying Factors:  Severity:  Timing: Context:   Objective  Well appearing patient in no apparent distress; mood and affect are within normal limits. Torso - Posterior (Back) Waist up skin examination: No atypical pigmented lesions or nonmelanoma skin cancer  Left Lower Back Multiple 1 to 3 mm smooth red dermal papules  Right Zygomatic Area Flat but textured light brown 5 mm lesion  Right Axilla 2 mm pedunculated papule    All skin waist up examined.   Assessment & Plan    Encounter for screening for malignant neoplasm of skin Torso - Posterior (Back)  Annual skin examination, encouraged to self examine twice annually.  Continue ultraviolet protection.  Hemangioma, unspecified site Left Lower Back  No intervention necessary  Seborrheic keratosis Right Zygomatic Area  Discussed options to treat the lesion.  No intervention for now.  Skin tag Right Axilla  May remove in future if patient chooses      I, Lavonna Monarch, MD, have reviewed all documentation for this visit.  The documentation on 03/11/21 for the exam, diagnosis, procedures, and orders are all accurate and complete.

## 2021-03-18 ENCOUNTER — Other Ambulatory Visit (HOSPITAL_COMMUNITY): Payer: Self-pay

## 2021-03-20 ENCOUNTER — Encounter: Payer: Self-pay | Admitting: Family Medicine

## 2021-03-22 NOTE — Telephone Encounter (Signed)
Key: [id number] to Plan today Drug Testosterone 12.5 MG/ACT(1%) gel Waiting for determination

## 2021-03-27 NOTE — Addendum Note (Signed)
Addended by: Lavonna Monarch on: 03/27/2021 06:42 PM   Modules accepted: Level of Service

## 2021-03-29 ENCOUNTER — Other Ambulatory Visit: Payer: Self-pay | Admitting: *Deleted

## 2021-03-29 NOTE — Telephone Encounter (Signed)
Send note and lab results to insurance as requested  Rx Testosterone gel was denied today

## 2021-04-04 NOTE — Telephone Encounter (Signed)
Patient aware, will call insurance for coverage on alternative

## 2021-04-10 ENCOUNTER — Other Ambulatory Visit (HOSPITAL_COMMUNITY): Payer: Self-pay

## 2021-04-11 ENCOUNTER — Other Ambulatory Visit (HOSPITAL_COMMUNITY): Payer: Self-pay

## 2021-04-15 ENCOUNTER — Other Ambulatory Visit (HOSPITAL_COMMUNITY): Payer: Self-pay

## 2021-05-09 ENCOUNTER — Other Ambulatory Visit (HOSPITAL_COMMUNITY): Payer: Self-pay

## 2021-05-09 ENCOUNTER — Other Ambulatory Visit: Payer: Self-pay | Admitting: Physician Assistant

## 2021-05-09 DIAGNOSIS — L409 Psoriasis, unspecified: Secondary | ICD-10-CM

## 2021-05-09 DIAGNOSIS — L4050 Arthropathic psoriasis, unspecified: Secondary | ICD-10-CM

## 2021-05-09 MED ORDER — TALTZ 80 MG/ML ~~LOC~~ SOSY
PREFILLED_SYRINGE | SUBCUTANEOUS | 0 refills | Status: DC
Start: 2021-05-09 — End: 2021-07-23
  Filled 2021-05-09 – 2021-05-17 (×2): qty 1, 28d supply, fill #0
  Filled 2021-06-06: qty 1, 28d supply, fill #1
  Filled 2021-07-12: qty 1, 28d supply, fill #2

## 2021-05-09 NOTE — Telephone Encounter (Signed)
Next Visit: 07/23/2021  Last Visit: 02/19/2021  Last Fill: 02/11/2021   VG:CYOYOOJZB arthritis   Current Dose per office note 02/19/2021: Taltz 80 mg sq injections every 4 weeks  Labs: 02/19/2021 CBC and CMP are normal.  TB Gold: 05/28/2020 Neg    Okay to refill Taltz?

## 2021-05-14 ENCOUNTER — Telehealth: Payer: Self-pay

## 2021-05-14 ENCOUNTER — Other Ambulatory Visit (HOSPITAL_COMMUNITY): Payer: Self-pay

## 2021-05-14 NOTE — Telephone Encounter (Signed)
Received notification from [facility] that a new PA is required.  Submitted a Prior Authorization request to [facility] for TALTZ via CoverMyMeds. Will update once we receive a response.   Key: [id number]

## 2021-05-17 ENCOUNTER — Other Ambulatory Visit (HOSPITAL_COMMUNITY): Payer: Self-pay

## 2021-05-17 NOTE — Telephone Encounter (Signed)
Received a notification from [facility] regarding Prior Authorization for Meta. Authorization has been CANCELLED because medication has been previously approved under Auth# [id number] from 08/02/2020 until 08/01/2021.

## 2021-05-20 ENCOUNTER — Encounter: Payer: Self-pay | Admitting: Family Medicine

## 2021-05-22 NOTE — Telephone Encounter (Signed)
See note

## 2021-05-23 ENCOUNTER — Encounter: Payer: BC Managed Care – PPO | Admitting: Family Medicine

## 2021-05-23 MED ORDER — TESTOSTERONE 12.5 MG/ACT (1%) TD GEL: 50.0000 mg | Freq: Every day | TRANSDERMAL | 3 refills | Status: DC

## 2021-05-29 ENCOUNTER — Telehealth: Payer: Self-pay | Admitting: *Deleted

## 2021-05-29 MED ORDER — TESTOSTERONE 12.5 MG/ACT (1%) TD GEL: 50.0000 mg | Freq: Every day | TRANSDERMAL | 3 refills | Status: DC

## 2021-05-29 NOTE — Telephone Encounter (Signed)
Spoke with pharmacy, medication was sent in correctly. Pharmacy stated medication needs a PA, patient states he is aware of insurance denying this medication now, has already started an appeal and is willing to pay for medication out of pocket due to him being completely out of medication. Patient stated this in a MyChart message, so we will not move forward with a PA unless patient changes mind. Patient is aware to call the pharmacy, to get the medication out of pocket. Pharmacy has prescription in their system.

## 2021-05-29 NOTE — Telephone Encounter (Signed)
Dr. Jerline Pain, pharmacy sent message Rx needs to be 150 mg cause there is 2 in a package. Please resend.

## 2021-05-29 NOTE — Telephone Encounter (Signed)
PCP out of the office. Can you send rx?

## 2021-05-29 NOTE — Telephone Encounter (Signed)
Patient has called in requesting for script to be sent today.

## 2021-06-06 ENCOUNTER — Other Ambulatory Visit (HOSPITAL_COMMUNITY): Payer: Self-pay

## 2021-06-13 ENCOUNTER — Other Ambulatory Visit (HOSPITAL_COMMUNITY): Payer: Self-pay

## 2021-07-09 ENCOUNTER — Encounter: Payer: Self-pay | Admitting: Family Medicine

## 2021-07-09 NOTE — Telephone Encounter (Signed)
Patient insurance has been updated.

## 2021-07-11 NOTE — Progress Notes (Signed)
Office Visit Note  Patient: Jeffrey Klein             Date of Birth: [birth year]           MRN: [id number]             PCP: Vivi Barrack, MD Referring: Vivi Barrack, MD Visit Date: 07/23/2021 Occupation: @GUAROCC @  Subjective:  Medication monitoring   History of Present Illness: Jeffrey Klein is a 61 y.o. male with history of psoriatic arthritis, osteoarthritis, and gout.  He is on taltz 80 mg sq injections every 28 days.  He denies any signs or symptoms of a flare recently.  He states that he has had 99.9% skin clearance on tolerance.  He has a small patch of psoriasis in the navel and uses betamethasone cream topically as needed.  He denies any other new patches.  He denies any SI joint discomfort.  He has occasional discomfort in his lower back and symptoms of sciatica.  He has been trying to stretch on a regular basis.  He denies any Achilles tendinitis or plantar fasciitis.  He denies any joint swelling at this time.  He has not had any recent gout flares.  He is taking allopurinol 100 mg 1 tablet daily.  He decided against increasing the dose of allopurinol as advised by Dr. Estanislado Pandy after reviewing his labs on 07/17/2021.   Activities of Daily Living:  Patient reports morning stiffness for a few minutes.   Patient Denies nocturnal pain.  Difficulty dressing/grooming: Denies Difficulty climbing stairs: Denies Difficulty getting out of chair: Denies Difficulty using hands for taps, buttons, cutlery, and/or writing: Denies  Review of Systems  Constitutional:  Negative for fatigue.  HENT:  Positive for mouth dryness. Negative for mouth sores and nose dryness.   Eyes:  Positive for dryness. Negative for pain and itching.  Respiratory:  Negative for shortness of breath and difficulty breathing.   Cardiovascular:  Negative for chest pain and palpitations.  Gastrointestinal:  Negative for blood in stool, constipation and diarrhea.  Endocrine: Negative for increased urination.   Genitourinary:  Negative for difficulty urinating.  Musculoskeletal:  Positive for joint swelling and morning stiffness. Negative for joint pain, joint pain, myalgias, muscle tenderness and myalgias.  Skin:  Positive for rash. Negative for color change and redness.  Allergic/Immunologic: Negative for susceptible to infections.  Neurological:  Negative for dizziness, numbness, headaches, memory loss and weakness.  Hematological:  Negative for bruising/bleeding tendency.  Psychiatric/Behavioral:  Negative for confusion.    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 No pertinent past medical history.  Family History  Problem Relation Age of Onset   Psoriasis Sister    Psoriasis Paternal 89    Healthy Daughter    ADD / ADHD Son    Drug abuse Son    Past Surgical History:  Procedure Laterality Date   KNEE ARTHROSCOPY Bilateral    LASIK Bilateral 2006   TONSILLECTOMY     as a child    WRIST SURGERY Right  Social History   Social History Narrative   Not on file   Immunization History  Administered Date(s) Administered   Influenza-Unspecified 02/21/2021   PFIZER(Purple Top)SARS-COV-2 Vaccination 10/06/2019, 10/31/2019, 03/05/2020   Pneumococcal-Unspecified 08/19/2018   Tdap 11/19/2020   Zoster Recombinat (Shingrix) 04/05/2019, 06/17/2019     Objective: Vital Signs: BP (!) 154/82 (BP Location: Left Arm, Patient Position: Sitting, Cuff Size: Normal)    Pulse 85    Ht 5' 10"  (1.778 m)    Wt 226 lb  12.8 oz (102.9 kg)    BMI 32.54 kg/m    Physical Exam Vitals and nursing note reviewed.  Constitutional:      Appearance: He is well-developed.  HENT:     Head: Normocephalic and atraumatic.  Eyes:     Conjunctiva/sclera: Conjunctivae normal.     Pupils: Pupils are equal, round, and reactive to light.  Pulmonary:     Effort: Pulmonary effort is normal.  Abdominal:     Palpations: Abdomen is soft.  Musculoskeletal:     Cervical back: Normal range of motion and neck supple.  Skin:    General: Skin is warm and dry.     Capillary Refill: Capillary refill takes less than 2 seconds.  Neurological:     Mental Status: He is alert and oriented to person, place, and time.  Psychiatric:        Behavior: Behavior normal.     Musculoskeletal Exam: C-spine has good range of motion with no discomfort.  No midline spinal tenderness or SI joint tenderness.  Shoulder joints and elbow joints have good range of motion with no discomfort.  Slightly limited extension of the right wrist.  No tenderness or synovitis over MCP or PIP joints.  Some PIP and DIP thickening consistent with osteoarthritis of both hands.  Complete fist formation bilaterally.  Hip joints have good range of motion with no groin pain.  Knee joints have good range of motion with no warmth or effusion.  Ankle joints have good range of motion with no tenderness or joint swelling.  No evidence of Achilles tendinitis.  CDAI Exam: CDAI Score: -- Patient Global: --; Provider Global: -- Swollen: --; Tender: -- Joint Exam 07/23/2021   No joint exam has been documented for this visit   There is currently no information documented on the homunculus. Go to the Rheumatology activity and complete the homunculus joint exam.  Investigation: No additional findings.  Imaging: No results found.  Recent Labs: Lab Results  Component Value Date   WBC 5.9 07/17/2021   HGB 15.8 07/17/2021   PLT 293 07/17/2021   NA 140 07/17/2021   K 4.3  07/17/2021   CL 103 07/17/2021   CO2 29 07/17/2021   GLUCOSE 98 07/17/2021   BUN 18 07/17/2021   CREATININE 1.10 07/17/2021   BILITOT 0.4 07/17/2021   ALKPHOS 35 (L) 11/19/2020   AST 29 07/17/2021   ALT 33 07/17/2021   PROT 6.7 07/17/2021   ALBUMIN 4.8 11/19/2020   CALCIUM 9.6 07/17/2021   GFRAA 107 08/16/2020   QFTBGOLDPLUS NEGATIVE 07/17/2021    Speciality Comments: Inadequate response to Enbrel, Stelara, Cosentyx. He has been on [place] for 6 years.  Procedures:  No procedures performed Allergies: Patient has no known allergies.     .   Assessment / Plan:     Visit Diagnoses: Psoriatic arthritis ([place]) -He has no joint tenderness or synovitis on examination today.  No evidence of dactylitis, Achilles tendonitis, plantar fasciitis, or SI joint discomfort.  He has clinically been doing well on Taltz 80 mg subcutaneous injections every 28 days.  He has not missed any doses of Taltz recently and is due for his next injection today.  He has had 99.9% skin clearance on Taltz and only has a small patch in the navel.  He uses betamethasone 0.1% cream topically as needed.  A refill of betamethasone cream and Taltz were sent to the pharmacy today.  He will remain on the current treatment regimen.  He was advised to notify us if he develops signs or symptoms of a flare.  He will follow-up in the office in 5 months.  plan: TALTZ 11 MG/ML SOSY  Association of heart disease with psoriatic arthritis was discussed. Need to monitor blood pressure, cholesterol, and to exercise 30-60 minutes on daily basis was discussed.   Psoriasis - Diagnosed in 1985. He has had excellent skin clearance while on taltz.  He has a small patch of psoriasis in his navel.  No new patches.  He uses betamethasone valerate 0.1% cream as needed and remains on taltz.  Both medication refills were sent to the pharmacy today.- Plan: TALTZ 80 MG/ML SOSY  High risk medication use - Taltz 80 mg sq injections every 4 weeks-started  09/23/16-initially prescribed by rheum in [place]. Inadequate response to Enbrel, Stelara, and Cosentyx. CBC and CMP updated on 07/17/21. Alk phos borderline low-29, rest of CMP WNL. CBC WNL.  He will be due to update lab work in April and every 3 months.  Standing orders for CBC and CMP remain in place.  TB gold negative on 07/17/21.  He was diagnosed with the flu in November 2022.  He did not hold Taltz during the infection.  Discussed the importance of always holding Taltz if he develops signs or symptoms of an infection and to resume once the infection is completely cleared.  He voiced understanding.  Primary osteoarthritis of both hands: He has PIP and DIP thickening consistent with osteoarthritis of both hands.  Some tenderness over the left second PIP joint.  Complete fist formation bilaterally.  Discussed the importance of joint protection and muscle strengthening.  Chronic SI joint pain: He is not experiencing any increased discomfort in his lower back at this time.  He has occasional symptoms of sciatica.  He has been performing home exercises.  He was given a handout of exercises to perform.  I also discussed the use of a foam roller.  He was advised to notify us if his symptoms worsen. On examination today he did not have any SI joint tenderness to palpation.  Primary osteoarthritis of both hips: He has good range of motion at the hip joints on examination today.  No groin pain.  Primary osteoarthritis of both knees: He has good range of motion of both knee joints on examination today.  No warmth or effusion was noted.  Primary osteoarthritis of both feet: He is not experiencing any increased discomfort in his feet at this time.  He has good range of motion of both ankle joints with no tenderness or inflammation.  Idiopathic chronic gout without tophus, unspecified site -He has not had any signs or symptoms of a gout flare.  He is taking allopurinol 100 mg 1 tablets by mouth daily for  management of gout.  Uric acid was 7.8 on 07/17/21.  Discussed that ideally his uric acid level should be less than 6.  Dr. Estanislado Pandy advised the patient to increase allopurinol to 200 mg daily after his most  recent lab work but he has decided to remain on low-dose allopurinol since he has not had a flare in many years.  He does not want to make any dose adjustments at this time.  Dietary modifications and recommendations were discussed today.  He was also given a handout of information to review about the dietary recommendations. He was advised to notify us if he develops signs or symptoms of a gout flare.  Other medical conditions are listed as follows:   Hyperpigmented skin lesion - Right index finger nailbed.  Resolved.   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  Meds ordered this encounter  Medications   TALTZ 80 MG/ML SOSY    Sig: INJECT 80 MG INTO THE SKIN EVERY 28 (TWENTY-EIGHT) DAYS    Dispense:  3 mL    Refill:  0   betamethasone valerate (VALISONE) 0.1 % cream    Sig: Apply 1 application topically as needed.    Dispense:  45 g    Refill:  0     Follow-Up Instructions: Return in about 5 months (around 12/21/2021) for Psoriatic arthritis, Osteoarthritis, Gout.   Ofilia Neas, PA-C  Note - This record has been created using Dragon software.  Chart creation errors have been sought, but may not always  have been located. Such creation errors do not reflect on  the standard of medical care.

## 2021-07-12 ENCOUNTER — Telehealth: Payer: Self-pay

## 2021-07-12 ENCOUNTER — Other Ambulatory Visit (HOSPITAL_COMMUNITY): Payer: Self-pay

## 2021-07-12 NOTE — Telephone Encounter (Signed)
Patient reported to [employer] that he had obtained new insurance. Submitted PA under new plan info.  Received notification from [place] regarding a prior authorization for [place]. Authorization has been APPROVED from 07/12/2021 to 07/12/2022.   Patient must fill through [facility]: [phone]  Authorization # [id number]

## 2021-07-13 ENCOUNTER — Other Ambulatory Visit (HOSPITAL_COMMUNITY): Payer: Self-pay

## 2021-07-15 ENCOUNTER — Other Ambulatory Visit (HOSPITAL_COMMUNITY): Payer: Self-pay

## 2021-07-15 ENCOUNTER — Encounter: Payer: Self-pay | Admitting: Rheumatology

## 2021-07-17 ENCOUNTER — Other Ambulatory Visit: Payer: Self-pay | Admitting: *Deleted

## 2021-07-17 DIAGNOSIS — M1A00X0 Idiopathic chronic gout, unspecified site, without tophus (tophi): Secondary | ICD-10-CM

## 2021-07-17 DIAGNOSIS — Z79899 Other long term (current) drug therapy: Secondary | ICD-10-CM

## 2021-07-20 LAB — QUANTIFERON-TB GOLD PLUS
Mitogen-NIL: >10 IU/mL
NIL: 0.08 IU/mL
QuantiFERON-TB Gold Plus: NEGATIVE
TB1-NIL: 0.01 IU/mL
TB2-NIL: 0 IU/mL

## 2021-07-20 LAB — COMPLETE METABOLIC PANEL WITH GFR
AG Ratio: 2 (calc) (ref 1.0–2.5)
ALT: 33 U/L (ref 9–46)
AST: 29 U/L (ref 10–35)
Albumin: 4.5 g/dL (ref 3.6–5.1)
Alkaline phosphatase (APISO): 29 U/L — ABNORMAL LOW (ref 35–144)
BUN: 18 mg/dL (ref 7–25)
CO2: 29 mmol/L (ref 20–32)
Calcium: 9.6 mg/dL (ref 8.6–10.3)
Chloride: 103 mmol/L (ref 98–110)
Creat: 1.1 mg/dL (ref 0.70–1.35)
Globulin: 2.2 g/dL (calc) (ref 1.9–3.7)
Glucose, Bld: 98 mg/dL (ref 65–99)
Potassium: 4.3 mmol/L (ref 3.5–5.3)
Sodium: 140 mmol/L (ref 135–146)
Total Bilirubin: 0.4 mg/dL (ref 0.2–1.2)
Total Protein: 6.7 g/dL (ref 6.1–8.1)
eGFR: 76 mL/min/{1.73_m2} (ref >=60)

## 2021-07-20 LAB — URIC ACID: Uric Acid, Serum: 7.8 mg/dL (ref 4.0–8.0)

## 2021-07-20 LAB — CBC WITH DIFFERENTIAL/PLATELET
Absolute Monocytes: 802 cells/uL (ref 200–950)
Basophils Absolute: 18 cells/uL (ref 0–200)
Basophils Relative: 0.3 %
Eosinophils Absolute: 159 cells/uL (ref 15–500)
Eosinophils Relative: 2.7 %
HCT: 48.3 % (ref 38.5–50.0)
Hemoglobin: 15.8 g/dL (ref 13.2–17.1)
Lymphs Abs: 1274 cells/uL (ref 850–3900)
MCH: 28.9 pg (ref 27.0–33.0)
MCHC: 32.7 g/dL (ref 32.0–36.0)
MCV: 88.3 fL (ref 80.0–100.0)
MPV: 9.7 fL (ref 7.5–12.5)
Monocytes Relative: 13.6 %
Neutro Abs: 3646 cells/uL (ref 1500–7800)
Neutrophils Relative %: 61.8 %
Platelets: 293 10*3/uL (ref 140–400)
RBC: 5.47 10*6/uL (ref 4.20–5.80)
RDW: 13.1 % (ref 11.0–15.0)
Total Lymphocyte: 21.6 %
WBC: 5.9 10*3/uL (ref 3.8–10.8)

## 2021-07-22 NOTE — Progress Notes (Signed)
CBC and CMP are normal.  TB Gold is negative.  Uric acid is mildly elevated.  Patient was not taking allopurinol on a regular basis at the last visit.  If he has been taking allopurinol on a regular basis then I will increase the dose of allopurinol to 100 mg tablet, 2 tablets p.o. daily.

## 2021-07-23 ENCOUNTER — Encounter: Payer: Self-pay | Admitting: Physician Assistant

## 2021-07-23 ENCOUNTER — Ambulatory Visit: Payer: Managed Care, Other (non HMO) | Admitting: Physician Assistant

## 2021-07-23 ENCOUNTER — Other Ambulatory Visit: Payer: Self-pay

## 2021-07-23 VITALS — BP 154/82 | HR 85 | Ht 70.0 in | Wt 226.8 lb

## 2021-07-23 DIAGNOSIS — M19042 Primary osteoarthritis, left hand: Secondary | ICD-10-CM

## 2021-07-23 DIAGNOSIS — D804 Selective deficiency of immunoglobulin M [IgM]: Secondary | ICD-10-CM

## 2021-07-23 DIAGNOSIS — L4050 Arthropathic psoriasis, unspecified: Secondary | ICD-10-CM | POA: Diagnosis not present

## 2021-07-23 DIAGNOSIS — M19071 Primary osteoarthritis, right ankle and foot: Secondary | ICD-10-CM

## 2021-07-23 DIAGNOSIS — G8929 Other chronic pain: Secondary | ICD-10-CM

## 2021-07-23 DIAGNOSIS — L819 Disorder of pigmentation, unspecified: Secondary | ICD-10-CM

## 2021-07-23 DIAGNOSIS — L409 Psoriasis, unspecified: Secondary | ICD-10-CM

## 2021-07-23 DIAGNOSIS — Z79899 Other long term (current) drug therapy: Secondary | ICD-10-CM

## 2021-07-23 DIAGNOSIS — M533 Sacrococcygeal disorders, not elsewhere classified: Secondary | ICD-10-CM

## 2021-07-23 DIAGNOSIS — Z8719 Personal history of other diseases of the digestive system: Secondary | ICD-10-CM

## 2021-07-23 DIAGNOSIS — M170 Bilateral primary osteoarthritis of knee: Secondary | ICD-10-CM

## 2021-07-23 DIAGNOSIS — M1A00X0 Idiopathic chronic gout, unspecified site, without tophus (tophi): Secondary | ICD-10-CM

## 2021-07-23 DIAGNOSIS — M19041 Primary osteoarthritis, right hand: Secondary | ICD-10-CM | POA: Diagnosis not present

## 2021-07-23 DIAGNOSIS — Z840 Family history of diseases of the skin and subcutaneous tissue: Secondary | ICD-10-CM

## 2021-07-23 DIAGNOSIS — M19072 Primary osteoarthritis, left ankle and foot: Secondary | ICD-10-CM

## 2021-07-23 DIAGNOSIS — E782 Mixed hyperlipidemia: Secondary | ICD-10-CM

## 2021-07-23 DIAGNOSIS — M160 Bilateral primary osteoarthritis of hip: Secondary | ICD-10-CM

## 2021-07-23 MED ORDER — BETAMETHASONE VALERATE 0.1 % EX CREA: 1.0000 "application " | TOPICAL_CREAM | CUTANEOUS | 0 refills | Status: DC | PRN

## 2021-07-23 MED ORDER — TALTZ 80 MG/ML ~~LOC~~ SOSY: PREFILLED_SYRINGE | SUBCUTANEOUS | 0 refills | Status: DC

## 2021-07-23 NOTE — Patient Instructions (Addendum)
Standing Labs We placed an order today for your standing lab work.   Please have your standing labs drawn in April and every 3 months   If possible, please have your labs drawn 2 weeks prior to your appointment so that the provider can discuss your results at your appointment.  Please note that you may see your imaging and lab results in [facility] before we have reviewed them. We may be awaiting multiple results to interpret others before contacting you. Please allow our office up to 72 hours to thoroughly review all of the results before contacting the office for clarification of your results.  We have open lab daily: Monday through Thursday from 1:30-4:30 PM and Friday from 1:30-4:00 PM at the office of Dr. Bo Merino, [facility].   Please be advised, all patients with office appointments requiring lab work will take precedent over walk-in lab work.  If possible, please come for your lab work on Monday and Friday afternoons, as you may experience shorter wait times. The office is located at [address] [place], [place], [place] [address] No appointment is necessary.   Labs are drawn by [facility]. Please bring your co-pay at the time of your lab draw.  You may receive a bill from [facility] for your lab work.  Please note if you are on Hydroxychloroquine and and an order has been placed for a Hydroxychloroquine level, you will need to have it drawn 4 hours or more after your last dose.  If you wish to have your labs drawn at another location, please call the office 24 hours in advance to send orders.  If you have any questions regarding directions or hours of operation,  please call [phone].   As a reminder, please drink plenty of water prior to coming for your lab work. Thanks!  If you have signs or symptoms of an infection or start antibiotics: First, call your PCP for workup of your infection. Hold your medication through the infection, until you complete your  antibiotics, and until symptoms resolve if you take the following: Injectable medication (Actemra, Benlysta, Cimzia, Cosentyx, Enbrel, Humira, Kevzara, Orencia, Remicade, Simponi, Stelara, Taltz, Tremfya) Methotrexate Leflunomide (Arava) Mycophenolate (Cellcept) Roma Kayser, or Rinvoq   Sciatica Rehab Ask your health care provider which exercises are safe for you. Do exercises exactly as told by your health care provider and adjust them as directed. It is normal to feel mild stretching, pulling, tightness, or discomfort as you do these exercises. Stop right away if you feel sudden pain or your pain gets worse. Do not begin these exercises until told by your health care provider. Stretching and range-of-motion exercises These exercises warm up your muscles and joints and improve the movement and flexibility of your hips and back. These exercises also help to relieve pain, numbness, and tingling. Sciatic nerve glide Sit in a chair with your head facing down toward your chest. Place your hands behind your back. Let your shoulders slump forward. Slowly straighten one of your legs while you tilt your head back as if you are looking toward the ceiling. Only straighten your leg as far as you can without making your symptoms worse. Hold this position for __________ seconds. Slowly return to the starting position. Repeat with your other leg. Repeat __________ times. Complete this exercise __________ times a day. Knee to chest with hip adduction and internal rotation  Lie on your back on a firm surface with both legs straight. Bend one of your knees and move  it up toward your chest until you feel a gentle stretch in your lower back and buttock. Then, move your knee toward the shoulder that is on the opposite side from your leg. This is hip adduction and internal rotation. Hold your leg in this position by holding on to the front of your knee. Hold this position for __________ seconds. Slowly  return to the starting position. Repeat with your other leg. Repeat __________ times. Complete this exercise __________ times a day. Prone extension on elbows  Lie on your abdomen on a firm surface. A bed may be too soft for this exercise. Prop yourself up on your elbows. Use your arms to help lift your chest up until you feel a gentle stretch in your abdomen and your lower back. This will place some of your body weight on your elbows. If this is uncomfortable, try stacking pillows under your chest. Your hips should stay down, against the surface that you are lying on. Keep your hip and back muscles relaxed. Hold this position for __________ seconds. Slowly relax your upper body and return to the starting position. Repeat __________ times. Complete this exercise __________ times a day. Strengthening exercises These exercises build strength and endurance in your back. Endurance is the ability to use your muscles for a long time, even after they get tired. Pelvic tilt This exercise strengthens the muscles that lie deep in the abdomen. Lie on your back on a firm surface. Bend your knees and keep your feet flat on the floor. Tense your abdominal muscles. Tip your pelvis up toward the ceiling and flatten your lower back into the floor. To help with this exercise, you may place a small towel under your lower back and try to push your back into the towel. Hold this position for __________ seconds. Let your muscles relax completely before you repeat this exercise. Repeat __________ times. Complete this exercise __________ times a day. Alternating arm and leg raises  Get on your hands and knees on a firm surface. If you are on a hard floor, you may want to use padding, such as an exercise mat, to cushion your knees. Line up your arms and legs. Your hands should be directly below your shoulders, and your knees should be directly below your hips. Lift your left leg behind you. At the same time, raise  your right arm and straighten it in front of you. Do not lift your leg higher than your hip. Do not lift your arm higher than your shoulder. Keep your abdominal and back muscles tight. Keep your hips facing the ground. Do not arch your back. Keep your balance carefully, and do not hold your breath. Hold this position for __________ seconds. Slowly return to the starting position. Repeat with your right leg and your left arm. Repeat __________ times. Complete this exercise __________ times a day. Posture and body mechanics Good posture and healthy body mechanics can help to relieve stress in your body's tissues and joints. Body mechanics refers to the movements and positions of your body while you do your daily activities. Posture is part of body mechanics. Good posture means: Your spine is in its natural S-curve position (neutral). Your shoulders are pulled back slightly. Your head is not tipped forward. Follow these guidelines to improve your posture and body mechanics in your everyday activities. Standing  When standing, keep your spine neutral and your feet about hip width apart. Keep a slight bend in your knees. Your ears, shoulders, and hips should  line up. When you do a task in which you stand in one place for a long time, place one foot up on a stable object that is 2-4 inches (5-10 cm) high, such as a footstool. This helps keep your spine neutral. Sitting  When sitting, keep your spine neutral and keep your feet flat on the floor. Use a footrest, if necessary, and keep your thighs parallel to the floor. Avoid rounding your shoulders, and avoid tilting your head forward. When working at a desk or a computer, keep your desk at a height where your hands are slightly lower than your elbows. Slide your chair under your desk so you are close enough to maintain good posture. When working at a computer, place your monitor at a height where you are looking straight ahead and you do not have  to tilt your head forward or downward to look at the screen. Resting When lying down and resting, avoid positions that are most painful for you. If you have pain with activities such as sitting, bending, stooping, or squatting, lie in a position in which your body does not bend very much. For example, avoid curling up on your side with your arms and knees near your chest (fetal position). If you have pain with activities such as standing for a long time or reaching with your arms, lie with your spine in a neutral position and bend your knees slightly. Try the following positions: Lying on your side with a pillow between your knees. Lying on your back with a pillow under your knees. Lifting  When lifting objects, keep your feet at least shoulder width apart and tighten your abdominal muscles. Bend your knees and hips and keep your spine neutral. It is important to lift using the strength of your legs, not your back. Do not lock your knees straight out. Always ask for help to lift heavy or awkward objects. This information is not intended to replace advice given to you by your health care provider. Make sure you discuss any questions you have with your health care provider. Document Revised: 10/15/2018 Document Reviewed: 07/15/2018 Elsevier Patient Education  2022 [place].  Sacroiliac Joint Dysfunction Sacroiliac joint dysfunction is a condition that causes inflammation on one or both sides of the sacroiliac (SI) joint. The SI joint is the joint between two bones of the pelvis called the sacrum and the ilium. The sacrum is the bone at the base of the spine. The ilium is the large bone that forms the hip. This condition causes deep aching or burning pain in the low back. In some cases, the pain may also spread into one or both buttocks, hips, or thighs. What are the causes? This condition may be caused by: Pregnancy. During pregnancy, extra stress is put on the SI joints because the pelvis  widens. Injury, such as: Injuries from car crashes. Sports-related injuries. Work-related injuries. Having one leg that is shorter than the other. Conditions that affect the joints, such as: Rheumatoid arthritis. Gout. Psoriatic arthritis. Joint infection (septic arthritis). Sometimes, the cause of SI joint dysfunction is not known. What are the signs or symptoms? Symptoms of this condition include: Aching or burning pain in the lower back. The pain may also spread to other areas, such as: Buttocks. Groin. Thighs. Muscle spasms in or around the painful areas. Increased pain when standing, walking, running, stair climbing, bending, or lifting. How is this diagnosed? This condition is diagnosed with a physical exam and your medical history. During the  exam, the health care provider may move one or both of your legs to different positions to check for pain. Various tests may be done to confirm the diagnosis, including: Imaging tests to look for other causes of pain. These may include: MRI. CT scan. Bone scan. Diagnostic injection. A numbing medicine is injected into the SI joint using a needle. If your pain is temporarily improved or stopped after the injection, this can indicate that SI joint dysfunction is the problem. How is this treated? Treatment depends on the cause and severity of your condition. Treatment options can be noninvasive and may include: Ice or heat applied to the lower back area after an injury. This may help reduce pain and muscle spasms. Medicines to relieve pain or inflammation or to relax the muscles. Wearing a back brace (sacroiliac brace) to help support the joint while your back is healing. Physical therapy to increase muscle strength around the joint and flexibility at the joint. This may also involve learning proper body positions and ways of moving to relieve stress on the joint. Direct manipulation of the SI joint. Use of a device that provides electrical  stimulation to help reduce pain at the joint. Other treatments may include: Injections of steroid medicine into the joint to reduce pain and swelling. Radiofrequency ablation. This treatment uses heat to burn away nerves that are carrying pain messages from the joint. Surgery to put in screws and plates that limit or prevent joint motion. This is rare. Follow these instructions at home: Medicines Take over-the-counter and prescription medicines only as told by your health care provider. Ask your health care provider if the medicine prescribed to you: Requires you to avoid driving or using machinery. Can cause constipation. You may need to take these actions to prevent or treat constipation: Drink enough fluid to keep your urine pale yellow. Take over-the-counter or prescription medicines. Eat foods that are high in fiber, such as beans, whole grains, and fresh fruits and vegetables. Limit foods that are high in fat and processed sugars, such as fried or sweet foods. If you have a brace: Wear the brace as told by your health care provider. Remove it only as told by your health care provider. Keep the brace clean. If the brace is not waterproof: Do not let it get wet. Cover it with a watertight covering when you take a bath or a shower. Managing pain, stiffness, and swelling   Icing can help with pain and swelling. Heat may help with muscle tension or spasms. Ask your health care provider if you should use ice or heat. If directed, put ice on the affected area: If you have a removable brace, remove it as told by your health care provider. Put ice in a plastic bag. Place a towel between your skin and the bag. Leave the ice on for 20 minutes, 2-3 times a day. Remove the ice if your skin turns bright red. This is very important. If you cannot feel pain, heat, or cold, you have a greater risk of damage to the area. If directed, apply heat to the affected area as often as told by your health  care provider. Use the heat source that your health care provider recommends, such as a moist heat pack or a heating pad. Place a towel between your skin and the heat source. Leave the heat on for 20-30 minutes. Remove the heat if your skin turns bright red. This is especially important if you are unable to feel pain, heat,  or cold. You may have a greater risk of getting burned. General instructions Rest as needed. Return to your normal activities as told by your health care provider. Ask your health care provider what activities are safe for you. Do exercises as told by your health care provider or physical therapist. Keep all follow-up visits. This is important. Contact a health care provider if: Your pain is not controlled with medicine. You have a fever. Your pain is getting worse. Get help right away if: You have weakness, numbness, or tingling in your legs or feet. You lose control of your bladder or bowels. Summary Sacroiliac (SI) joint dysfunction is a condition that causes inflammation on one or both sides of the SI joint. This condition causes deep aching or burning pain in the low back. In some cases, the pain may also spread into one or both buttocks, hips, or thighs. Treatment depends on the cause and severity of your condition. It may include medicines to reduce pain and swelling or to relax muscles. This information is not intended to replace advice given to you by your health care provider. Make sure you discuss any questions you have with your health care provider. Document Revised: 11/03/2019 Document Reviewed: 11/03/2019 Elsevier Patient Education  2022 [place] for patients with Gout  Gout defined-Gout occurs when urate crystals accumulate in your joint causing the inflammation and intense pain of gout attack.  Urate crystals can form when you have high levels of uric acid in your blood.  Your body produces uric acid when it breaks down  prurines-substances that are found naturally in your body, as well as in certain foods such as organ meats, anchioves, herring, asparagus, and mushrooms.  Normally uric acid dissolves in your blood and passes through your kidneys into your urine.  But sometimes your body either produces too much uric acid or your kidneys excrete too little uric acid.  When this happens, uric acid can build up, forming sharp needle-like urate crystals in a joint or surrounding tissue that cause pain, inflammation and swelling.    Gout is characterized by sudden, severe attacks of pain, redness and tenderness in joints, often the joint at the base of the big toe.  Gout is complex form of arthritis that can affect anyone.  Men are more likely to get gout but women become increasingly more susceptible to gout after menopause.  An acute attack of gout can wake you up in the middle of the night with the sensation that your big toe is on fire.  The affected joint is hot, swollen and so tender that even the weight or the sheet on it may seem intolerable.  If you experience symptoms of an acute gout attack it is important to your doctor as soon as the symptoms start.  Gout that goes untreated can lead to worsening pain and joint damage.  Risk Factors:  You are more likely to develop gout if you have high levels of uric acid in your body.    Factors that increase the uric acid level in your body include:  Lifestyle factors.  Excessive alcohol use-generally more than two drinks a day for men and more than one for women increase the risk of gout.  Medical conditions.  Certain conditions make it more likely that you will develop gout.  These include hypertension, and chronic conditions such as diabetes, high levels of fat and cholesterol in the blood, and narrowing of the arteries.  Certain medications.  The uses of Thiazide diuretics- commonly used to treat hypertension and low dose aspirin can also increase uric acid  levels.  Family history of gout.  If other members of your family have had gout, you are more likely to develop the disease.  Age and sex. Gout occurs more often in men than it does in women, primarily because women tend to have lower uric acid levels than men do.  Men are more likely to develop gout earlier usually between the ages of 80-50- whereas women generally develop signs and symptoms after menopause.    Tests and diagnosis:  Tests to help diagnose gout may include:  Blood test.  Your doctor may recommend a blood test to measure the uric acid level in your blood .  Blood tests can be misleading, though.  Some people have high uric acid levels but never experience gout.  And some people have signs and symptoms of gout, but don't have unusual levels of uric acid in their blood.  Joint fluid test.  Your doctor may use a needle to draw fluid from your affected joint.  When examined under the microscope, your joint fluid may reveal urate crystals.  Treatment:  Treatment for gout usually involves medications.  What medications you and your doctor choose will be based on your current health and other medications you currently take.  Gout medications can be used to treat acute gout attacks and prevent future attacks as well as reduce your risk of complications from gout such as the development of tophi from urate crystal deposits.  Alternative medicine:   Certain foods have been studied for their potential to lower uric acid levels, including:  Coffee.  Studies have found an association between coffee drinking (regular and decaf) and lower uric acid levels.  The evidence is not enough to encourage non-coffee drinkers to start, but it may give clues to new ways of treating gout in the future.  Vitamin C.  Supplements containing vitamin C may reduce the levels of uric acid in your blood.  However, vitamin as a treatment for gout. Don't assume that if a little vitamin C is good, than lots is  better.  Megadoses of vitamin C may increase your bodies uric acid levels.  Cherries.  Cherries have been associated with lower levels of uric acid in studies, but it isn't clear if they have any effect on gout signs and symptoms.  Eating more cherries and other dar-colored fruits, such as blackberries, blueberries, purple grapes and raspberries, may be a safe way to support your gout treatment.    Lifestyle/Diet Recommendations:  Drink 8 to 16 cups ( about 2 to 4 liters) of fluid each day, with at least half being water. Avoid alcohol Eat a moderate amount of protein, preferably from healthy sources, such as low-fat or fat-free dairy, tofu, eggs, and nut butters. Limit you daily intake of meat, fish, and poultry to 4 to 6 ounces. Avoid high fat meats and desserts. Decrease you intake of shellfish, beef, lamb, pork, eggs and cheese. Choose a good source of vitamin C daily such as citrus fruits, strawberries, broccoli,  brussel sprouts, papaya, and cantaloupe.  Choose a good source of vitamin A every other day such as yellow fruits, or dark green/yellow vegetables. Avoid drastic weigh reduction or fasting.  If weigh loss is desired lose it over a period of several months. See "dietary considerations.." chart for specific food recommendations.  Dietary Considerations for people with Gout  Food with negligible purine content (  0-15 mg of purine nitrogen per 100 grams food)  May use as desired except on calorie variations  Non fat milk Cocoa Cereals (except in list II) Hard candies  Buttermilk Carbonated drinks Vegetables (except in list II) Sherbet  Coffee Fruits Sugar Honey  Tea Cottage Cheese Gelatin-jell-o Salt  Fruit juice Breads Angel food Cake   Herbs/spices Jams/Jellies [employer]    Foods that do not contain excessive purine content, but must be limited due to fat content  Cream Eggs Oil and Salad Dressing  Half and Half Peanut Butter Chocolate  Whole Milk Cakes  Potato Chips  Butter Ice Cream Fried Foods  Cheese Nuts Waffles, pancakes   List II: Food with moderate purine content (50-150 mg of purine nitrogen per 100 grams of food)  Limit total amount each day to 5 oz. cooked Lean meat, other than those on list III   Poultry, other than those on list III Fish, other than those on list III   Seafood, other than those on list III  These foods may be used occasionally  Peas Lentils Bran  Spinach Oatmeal Dried Beans and Peas  Asparagus Wheat Germ Mushrooms   Additional information about meat choices  Choose fish and poultry, particularly without skin, often.  Select lean, well trimmed cuts of meat.  Avoid all fatty meats, bacon , sausage, fried meats, fried fish, or poultry, luncheon meats, cold cuts, hot dogs, meats canned or frozen in gravy, spareribs and frozen and packaged prepared meats.   List III: Foods with HIGH purine content / Foods to AVOID (150-800 mg of purine nitrogen per 100 grams of food)  Anchovies Herring Meat Broths  Liver Mackerel Meat Extracts  Kidney Scallops Meat Drippings  Sardines Wild Game Mincemeat  Sweetbreads Goose Gravy  Heart Tongue Yeast, baker's and brewers   Commercial soups made with any of the foods listed in List II or List III  In addition avoid all alcoholic beverages

## 2021-07-29 ENCOUNTER — Other Ambulatory Visit (HOSPITAL_COMMUNITY): Payer: Self-pay

## 2021-07-30 ENCOUNTER — Telehealth: Payer: Self-pay | Admitting: Rheumatology

## 2021-07-30 ENCOUNTER — Encounter: Payer: Self-pay | Admitting: Family Medicine

## 2021-07-30 NOTE — Telephone Encounter (Signed)
[facility] left a voicemail regarding the patient. They received a prescription for Taltz and it was the maintenance dose The pharmacy wanted to know if the patient currently was on a loading dose because they have no record of filling a loading dose. Phone [phone] Ref #[id number]

## 2021-07-30 NOTE — Telephone Encounter (Signed)
Can we have him schedule an appointment to discuss if he has concerns?  Jeffrey Klein. Jerline Pain, MD 07/30/2021 1:05 PM

## 2021-07-30 NOTE — Telephone Encounter (Signed)
See attachment.

## 2021-07-30 NOTE — Telephone Encounter (Signed)
Spoke with [place] at [facility]. Advised patient is new to them due to insurance change. Advised her that patient has been on Taltz since 2018 so he is on a maintenance dose of Taltz. She noted patient's chart.

## 2021-07-31 ENCOUNTER — Other Ambulatory Visit: Payer: Self-pay

## 2021-07-31 MED ORDER — BETAMETHASONE VALERATE 0.1 % EX CREA: 1.0000 "application " | TOPICAL_CREAM | Freq: Every day | CUTANEOUS | 0 refills | Status: DC | PRN

## 2021-07-31 NOTE — Telephone Encounter (Signed)
[facility] pharmacy called stating they require "Medication Days' Supply" for Betamethasone valerate 0.1% cream.  Please resend prescription.

## 2021-07-31 NOTE — Telephone Encounter (Signed)
Please pend new prescription.  Instructions: Apply 1 application daily as needed.

## 2021-08-01 ENCOUNTER — Other Ambulatory Visit: Payer: Self-pay

## 2021-08-01 ENCOUNTER — Ambulatory Visit: Payer: Managed Care, Other (non HMO) | Admitting: Family Medicine

## 2021-08-01 ENCOUNTER — Encounter: Payer: Self-pay | Admitting: Family Medicine

## 2021-08-01 VITALS — BP 136/66 | HR 76 | Temp 97.6°F | Ht 70.0 in | Wt 227.8 lb

## 2021-08-01 DIAGNOSIS — L4050 Arthropathic psoriasis, unspecified: Secondary | ICD-10-CM

## 2021-08-01 DIAGNOSIS — I779 Disorder of arteries and arterioles, unspecified: Secondary | ICD-10-CM | POA: Diagnosis not present

## 2021-08-01 DIAGNOSIS — R7989 Other specified abnormal findings of blood chemistry: Secondary | ICD-10-CM | POA: Diagnosis not present

## 2021-08-01 DIAGNOSIS — E782 Mixed hyperlipidemia: Secondary | ICD-10-CM

## 2021-08-01 MED ORDER — TESTOSTERONE 12.5 MG/ACT (1%) TD GEL: 50.0000 mg | Freq: Every day | TRANSDERMAL | 3 refills | Status: DC

## 2021-08-01 NOTE — Assessment & Plan Note (Signed)
Patiently recently had screening done with an outside company.  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  We discussed diagnosis.  He is on Crestor.  We discussed lifestyle modifications.  Do not think this will cause him any significant issues in the future though he can recheck in a few years if desired to monitor progression.

## 2021-08-01 NOTE — Assessment & Plan Note (Signed)
On testosterone gel 50 mg daily.  We can recheck testosterone level and other labs when he comes back in for CPE in 6 months.

## 2021-08-01 NOTE — Progress Notes (Signed)
Jeffrey Klein is a 61 y.o. male who presents today for an office visit.  Assessment/Plan:  New/Acute Problems: Cough Improving.  Likely postinfectious cough.  Reassuring exam today.  Continue with watchful waiting  Left knee pain He has some tenderness palpation at distal left patellar tendon.  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  Could be related to psoriatic arthritis.  Symptoms are currently manageable.  Recommended ice.  He will discuss with rheumatologist if symptoms or not improving.  Concern for upper extremity PAD No significant difference in blood pressure readings between arms today.  Likely had a spurious reading during his screening with the outside company.  Reassured patient.  Do not need to do any further testing at this point.  Chronic Problems Addressed Today: Carotid artery disease ([place]) Patiently recently had screening done with an outside company.  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  We discussed diagnosis.  He is on Crestor.  We discussed lifestyle modifications.  Do not think this will cause him any significant issues in the future though he can recheck in a few years if desired to monitor progression.  Psoriatic arthritis ([place]) Follows with rheumatology. On Taltz and doing well.   Low testosterone On testosterone gel 50 mg daily.  We can recheck testosterone level and other labs when he comes back in for CPE in 6 months.  Hyperlipidemia, mixed Doing well with Crestor 10 mg daily.  We can check lipids next blood draw.     Subjective:  HPI:  Patient here for follow up. Last saw him in the office 6 months ago. He has followed up with rheumatologist since last visit.   He is still some lingering cough. Had flu about 2 months ago. His symptoms body ache, fatigue and cough with mucus at that time. He notes his symptoms have improved. However, cough is still present. Had Covid test at home which was negative. No fever, chills or  nausea.   He is currently on testosterone gel 50 ,g daily. He is compliant with his medication without any side effects..He use gel as needed. He has gained some weight. He is trying to work on diet and exercise.    Additionally, he is on Taltz 80 mg injection for psoriatic arthritis. He notes he has 99% skin clearance. He denies any flare up or new patch in any other areas. He follows up rheumatologist for this issue.   He also complain of some sensation in his left knee. Located on the knee cap. He notes whenever he rub on his knee he has some unusual sensation. He has more swelling on his left knee than the right. He denies any gout flares up at this time.   Since her last visit he had vascular disease screening with an outside company.  No signs of aortic aneurysm.  No signs of A. fib.  Was found to have mild bilateral coronary artery disease less than 50%.  Normal ABIs.  Was found to have a blood pressure in his upper extremities with his left arm systolic reading 992 and right arm systolic reading of 426       Objective:  Physical Exam: BP 136/66 Comment: right arm   Pulse 76    Temp 97.6 F (36.4 C)    Ht 5\' 10"  (1.778 m)    Wt 227 lb 12.8 oz (103.3 kg)    SpO2 96%    BMI 32.69 kg/m   Gen: No acute distress, resting comfortably CV:  Regular rate and rhythm with no murmurs appreciated Pulm: Normal work of breathing, clear to auscultation bilaterally with no crackles, wheezes, or rhonchi MSK: No deformities.  Tenderness to palpation at patellar tendon insertion at left tibial tuberosity. Neuro: Grossly normal, moves all extremities Psych: Normal affect and thought content       I,Savera Zaman,acting as a scribe for Dimas Chyle, MD.,have documented all relevant documentation on the behalf of Dimas Chyle, MD,as directed by  Dimas Chyle, MD while in the presence of Dimas Chyle, MD.   I, Dimas Chyle, MD, have reviewed all documentation for this visit. The documentation on 08/01/21  for the exam, diagnosis, procedures, and orders are all accurate and complete.  Time Spent: 45 minutes of total time was spent on the date of the encounter performing the following actions: chart review prior to seeing the patient including his recent vascular disease screening results, obtaining history, performing a medically necessary exam, counseling on the treatment plan, placing orders, and documenting in our EHR.    Algis Greenhouse. Jerline Pain, MD 08/01/2021 8:47 AM

## 2021-08-01 NOTE — Assessment & Plan Note (Signed)
Follows with rheumatology. On Taltz and doing well.

## 2021-08-01 NOTE — Patient Instructions (Addendum)
It was very nice to see you today!  I will refill your testosterone today.  Please try putting ice on your knee.  We will see you back in 6 months for your annual check up with labs.  Please come back sooner if needed.  Take care, Dr Jerline Pain  PLEASE NOTE:  If you had any lab tests please let us know if you have not heard back within a few days. You may see your results on mychart before we have a chance to review them but we will give you a call once they are reviewed by [place]. If we ordered any referrals today, please let us know if you have not heard from their office within the next week.   Please try these tips to maintain a healthy lifestyle:  Eat at least 3 REAL meals and 1-2 snacks per day.  Aim for no more than 5 hours between eating.  If you eat breakfast, please do so within one hour of getting up.   Each meal should contain half fruits/vegetables, one quarter protein, and one quarter carbs (no bigger than a computer mouse)  Cut down on sweet beverages. This includes juice, soda, and sweet tea.   Drink at least 1 glass of water with each meal and aim for at least 8 glasses per day  Exercise at least 150 minutes every week.

## 2021-08-01 NOTE — Assessment & Plan Note (Signed)
Doing well with Crestor 10 mg daily.  We can check lipids next blood draw.

## 2021-08-03 ENCOUNTER — Telehealth: Payer: Self-pay | Admitting: *Deleted

## 2021-08-03 NOTE — Telephone Encounter (Signed)
Key: [id number] - PA Case ID: [id number] - Rx #: [id number] Outcome Approved today [id number];Review Type:Prior Auth;Coverage Start Date:08/03/2021;Coverage End Date:08/03/2022; Drug Testosterone 12.5 MG/ACT(1%) gel Pharmacy notified

## 2021-08-27 ENCOUNTER — Other Ambulatory Visit: Payer: Self-pay | Admitting: *Deleted

## 2021-08-27 ENCOUNTER — Encounter: Payer: Self-pay | Admitting: Family Medicine

## 2021-08-27 MED ORDER — ROSUVASTATIN CALCIUM 10 MG PO TABS: 10.0000 mg | ORAL_TABLET | Freq: Every day | ORAL | 1 refills | Status: DC

## 2021-09-08 ENCOUNTER — Other Ambulatory Visit: Payer: Self-pay | Admitting: Physician Assistant

## 2021-09-09 ENCOUNTER — Telehealth: Payer: Self-pay | Admitting: Rheumatology

## 2021-09-09 NOTE — Telephone Encounter (Signed)
Spoke with pharmacist and advised the days supply is 2 weeks.  ?

## 2021-09-09 NOTE — Telephone Encounter (Signed)
Katie from [facility] left a voicemail regarding the patient. She states they received a prescription for Betamethasone 0.1% cream. She states they need a day supply, how long the tube should last the patient. ?

## 2021-09-09 NOTE — Telephone Encounter (Signed)
Next Visit: 12/26/2021 ? ?Last Visit: 07/23/2021 ? ?Last Fill: ? ?Dx: Psoriatic arthritis  ? ?Current Dose per office note on 07/23/2021: betamethasone 0.1% cream topically as needed. ? ?Okay to refill betamethasone?   ?

## 2021-09-09 NOTE — Telephone Encounter (Signed)
Ok to apply betamethasone daily as needed for up to 2 weeks.

## 2021-09-25 ENCOUNTER — Other Ambulatory Visit: Payer: Self-pay | Admitting: Physician Assistant

## 2021-09-25 DIAGNOSIS — L409 Psoriasis, unspecified: Secondary | ICD-10-CM

## 2021-09-25 DIAGNOSIS — L4050 Arthropathic psoriasis, unspecified: Secondary | ICD-10-CM

## 2021-09-25 NOTE — Telephone Encounter (Signed)
Next Visit: 12/26/2021 ? ?Last Visit: 07/23/2021 ? ?Last Fill: 07/23/2021 ? ?RA:[id number] arthritis  ? ?Current Dose per office note 07/23/2021: Taltz 80 mg sq injections every 4 weeks ? ?Labs: 07/17/2021 CBC and CMP are normal ? ?TB Gold: 07/17/2021 Neg   ? ?Okay to refill Donnetta Hail?  ?

## 2021-09-29 ENCOUNTER — Other Ambulatory Visit: Payer: Self-pay | Admitting: Physician Assistant

## 2021-09-29 DIAGNOSIS — L409 Psoriasis, unspecified: Secondary | ICD-10-CM

## 2021-09-29 DIAGNOSIS — L4050 Arthropathic psoriasis, unspecified: Secondary | ICD-10-CM

## 2021-11-02 ENCOUNTER — Other Ambulatory Visit: Payer: Self-pay | Admitting: Family Medicine

## 2021-12-10 ENCOUNTER — Other Ambulatory Visit: Payer: Self-pay | Admitting: Physician Assistant

## 2021-12-10 DIAGNOSIS — Z79899 Other long term (current) drug therapy: Secondary | ICD-10-CM

## 2021-12-10 DIAGNOSIS — L409 Psoriasis, unspecified: Secondary | ICD-10-CM

## 2021-12-10 DIAGNOSIS — L4050 Arthropathic psoriasis, unspecified: Secondary | ICD-10-CM

## 2021-12-10 NOTE — Telephone Encounter (Signed)
Next Visit: 12/26/2021  Last Visit: 07/23/2021  Last Fill: 09/25/2021  SE:LTRVUYEBX arthritis   Current Dose per office note 07/23/2021: Taltz 80 mg sq injections every 4 weeks  Labs: 07/17/2021 CBC and CMP are normal  TB Gold: 07/17/2021 Neg     Patient advised he due to update labs. Patient states he will update them this week.   Okay to refill Taltz?

## 2021-12-12 ENCOUNTER — Other Ambulatory Visit: Payer: Self-pay | Admitting: *Deleted

## 2021-12-12 DIAGNOSIS — Z79899 Other long term (current) drug therapy: Secondary | ICD-10-CM

## 2021-12-13 LAB — COMPLETE METABOLIC PANEL WITH GFR
AG Ratio: 1.7 (calc) (ref 1.0–2.5)
ALT: 33 U/L (ref 9–46)
AST: 29 U/L (ref 10–35)
Albumin: 4.2 g/dL (ref 3.6–5.1)
Alkaline phosphatase (APISO): 31 U/L — ABNORMAL LOW (ref 35–144)
BUN: 16 mg/dL (ref 7–25)
CO2: 26 mmol/L (ref 20–32)
Calcium: 9.6 mg/dL (ref 8.6–10.3)
Chloride: 108 mmol/L (ref 98–110)
Creat: 1.13 mg/dL (ref 0.70–1.35)
Globulin: 2.5 g/dL (calc) (ref 1.9–3.7)
Glucose, Bld: 88 mg/dL (ref 65–99)
Potassium: 4.5 mmol/L (ref 3.5–5.3)
Sodium: 143 mmol/L (ref 135–146)
Total Bilirubin: 0.5 mg/dL (ref 0.2–1.2)
Total Protein: 6.7 g/dL (ref 6.1–8.1)
eGFR: 73 mL/min/{1.73_m2} (ref >=60)

## 2021-12-13 LAB — CBC WITH DIFFERENTIAL/PLATELET
Absolute Monocytes: 652 cells/uL (ref 200–950)
Basophils Absolute: 32 cells/uL (ref 0–200)
Basophils Relative: 0.6 %
Eosinophils Absolute: 170 cells/uL (ref 15–500)
Eosinophils Relative: 3.2 %
HCT: 47.9 % (ref 38.5–50.0)
Hemoglobin: 15.7 g/dL (ref 13.2–17.1)
Lymphs Abs: 1738 cells/uL (ref 850–3900)
MCH: 29.3 pg (ref 27.0–33.0)
MCHC: 32.8 g/dL (ref 32.0–36.0)
MCV: 89.5 fL (ref 80.0–100.0)
MPV: 9.5 fL (ref 7.5–12.5)
Monocytes Relative: 12.3 %
Neutro Abs: 2708 cells/uL (ref 1500–7800)
Neutrophils Relative %: 51.1 %
Platelets: 304 10*3/uL (ref 140–400)
RBC: 5.35 10*6/uL (ref 4.20–5.80)
RDW: 13.1 % (ref 11.0–15.0)
Total Lymphocyte: 32.8 %
WBC: 5.3 10*3/uL (ref 3.8–10.8)

## 2021-12-13 NOTE — Progress Notes (Signed)
CBC and CMP are normal.

## 2021-12-14 ENCOUNTER — Other Ambulatory Visit: Payer: Self-pay | Admitting: Physician Assistant

## 2021-12-14 DIAGNOSIS — L409 Psoriasis, unspecified: Secondary | ICD-10-CM

## 2021-12-14 DIAGNOSIS — L4050 Arthropathic psoriasis, unspecified: Secondary | ICD-10-CM

## 2021-12-16 NOTE — Telephone Encounter (Signed)
Next Visit: 12/26/2021  Last Visit: 07/23/2021  Last Fill: 12/10/2021 (30 day supply)  DX: Psoriatic arthritis   Current Dose per office note 12/10/2021: Taltz 80 mg sq injections every 4 weeks  Labs: 12/12/2021 CBC and CMP are normal.  TB Gold:  07/17/2021 Neg      Okay to refill Taltz?

## 2021-12-20 NOTE — Progress Notes (Signed)
Office Visit Note  Patient: Jeffrey Klein             Date of Birth: 06/25/1961           MRN: [id number]             PCP: Vivi Barrack, MD Referring: Vivi Barrack, MD Visit Date: 12/26/2021 Occupation: '@GUAROCC'$ @  Subjective:  Medication management  History of Present Illness: Williams Dietrick is a 61 y.o. male with history of psoriatic arthritis, psoriasis and osteoarthritis.  He states he has been taking Taltz 80 mg subcu monthly without any side effects.  He took last Taltz injection yesterday.  He denies any joint swelling.  He states he continues to have some discomfort in his hip joints and knee joints.  He has osteoarthritis in his hands but no joint swelling.  He has not had any recent SI joint discomfort.  He has some discomfort in his hip joints.  He has been doing his stretching exercises every evening.  He still has rash in the umbilical region from psoriasis.  He has not had a gout flare.  He takes allopurinol 100 mg p.o. daily.    Activities of Daily Living:  Patient reports morning stiffness for 1 minute.   Patient Denies nocturnal pain.  Difficulty dressing/grooming: Denies Difficulty climbing stairs: Denies Difficulty getting out of chair: Denies Difficulty using hands for taps, buttons, cutlery, and/or writing: Denies  Review of Systems  Constitutional:  Negative for fatigue.  HENT:  Positive for mouth dryness.   Eyes:  Positive for dryness.  Respiratory:  Negative for shortness of breath.   Cardiovascular:  Negative for swelling in legs/feet.  Gastrointestinal:  Negative for constipation.  Endocrine: Negative for excessive thirst.  Genitourinary:  Negative for difficulty urinating.  Musculoskeletal:  Positive for joint pain and joint pain. Negative for joint swelling.  Skin:  Positive for rash.  Allergic/Immunologic: Negative for susceptible to infections.  Neurological:  Negative for numbness.  Hematological:  Negative for bruising/bleeding tendency.   Psychiatric/Behavioral:  Negative for sleep disturbance.     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   Gout 11/18/2019   Hyperlipidemia, mixed 11/18/2019   Low testosterone 11/18/2019   Diverticulosis 11/18/2019   Psoriatic arthritis ([place]) 11/18/2019    Past Medical History:  Diagnosis Date   Diverticulosis    High cholesterol    Psoriasis    Psoriatic arthritis ([place])     Family History  Problem Relation Age of Onset   Psoriasis Sister    Psoriasis Paternal Grandmother    Healthy Daughter    ADD / ADHD Son    Drug abuse Son    Past Surgical History:  Procedure Laterality Date   KNEE ARTHROSCOPY Bilateral    LASIK Bilateral 2006   TONSILLECTOMY     as a child    WRIST SURGERY Right    Social History   Social History Narrative   Not on file   Immunization History  Administered Date(s) Administered   Influenza-Unspecified 02/21/2021   PFIZER(Purple Top)SARS-COV-2 Vaccination 10/06/2019, 10/31/2019, 03/05/2020   Pneumococcal-Unspecified 08/19/2018   Tdap 11/19/2020   Zoster Recombinat (Shingrix) 04/05/2019, 06/17/2019  Objective: Vital Signs: BP 124/75 (BP Location: Left Arm, Patient Position: Sitting, Cuff Size: Normal)   Pulse 79   Resp 15   Ht '5\' 10"'$  (1.778 m)   Wt 223 lb (101.2 kg)   BMI 32.00 kg/m    Physical Exam Vitals and nursing note reviewed.  Constitutional:      Appearance: He is well-developed.  HENT:     Head: Normocephalic and atraumatic.  Eyes:     Conjunctiva/sclera: Conjunctivae normal.     Pupils: Pupils are equal, round, and reactive to light.  Cardiovascular:     Rate and Rhythm:  Normal rate and regular rhythm.     Heart sounds: Normal heart sounds.  Pulmonary:     Effort: Pulmonary effort is normal.     Breath sounds: Normal breath sounds.  Abdominal:     General: Bowel sounds are normal.     Palpations: Abdomen is soft.  Musculoskeletal:     Cervical back: Normal range of motion and neck supple.  Skin:    General: Skin is warm and dry.     Capillary Refill: Capillary refill takes less than 2 seconds.     Comments: Erythematous plaque was noted in the umbilical region.  Neurological:     Mental Status: He is alert and oriented to person, place, and time.  Psychiatric:        Behavior: Behavior normal.      Musculoskeletal Exam: C-spine thoracic and lumbar spine were in good range of motion.  He had no SI joint tenderness.  Shoulder joints, elbow joints, wrist joints were in good range of motion with no synovitis.  He had bilateral PIP and DIP thickening with no synovitis.  Hip joints and knee joints with good range of motion.  There was no tenderness over ankles or MTPs.  No synovitis was noted on the examination today.  CDAI Exam: CDAI Score: -- Patient Global: --; Provider Global: -- Swollen: --; Tender: -- Joint Exam 12/26/2021   No joint exam has been documented for this visit   There is currently no information documented on the homunculus. Go to the Rheumatology activity and complete the homunculus joint exam.  Investigation: No additional findings.  Imaging: No results found.  Recent Labs: Lab Results  Component Value Date   WBC 5.3 12/12/2021   HGB 15.7 12/12/2021   PLT 304 12/12/2021   NA 143 12/12/2021   K 4.5 12/12/2021   CL 108 12/12/2021   CO2 26 12/12/2021   GLUCOSE 88 12/12/2021   BUN 16 12/12/2021   CREATININE 1.13 12/12/2021   BILITOT 0.5 12/12/2021   ALKPHOS 35 (L) 11/19/2020   AST 29 12/12/2021   ALT 33 12/12/2021   PROT 6.7 12/12/2021   ALBUMIN 4.8 11/19/2020   CALCIUM 9.6 12/12/2021   GFRAA 107 08/16/2020    QFTBGOLDPLUS NEGATIVE 07/17/2021    Speciality Comments: Inadequate response to Enbrel, Stelara, Cosentyx. He has been on [place] for 6 years.  Procedures:  No procedures performed Allergies: Patient has no known allergies.   Assessment / Plan:     Visit Diagnoses: Psoriatic arthritis ([facility])-his psoriatic arthritis is well controlled on Taltz 80 mg subcu monthly.  He denies any side effects from [place].  He had no synovitis on examination today.  Psoriasis - Diagnosed in 1985.  He still have a pH in the umbilical region.  He has been using betamethasone cream on a as needed basis.  High risk medication use - Taltz 80 mg sq injections  every 4 weeks-started 09/23/16-initially prescribed by rheum in [place].Inadequate response to Enbrel, Stelara, and Cosentyx.  Information regarding immunization was placed in the AVS.  He was also advised to hold Taltz if he develops an infection and resume after the infection resolves.  Labs from December 12, 2021 were reviewed CBC with differential and CMP with GFR were within normal limits.  He was advised to get labs every 3 months to monitor for drug toxicity.  Primary osteoarthritis of both hands-he had bilateral PIP and DIP thickening.  Joint protection muscle strengthening was discussed.  Chronic SI joint pain-resolved on Taltz.  He had good mobility in his lumbar spine.  Primary osteoarthritis of both hips-he has some stiffness in his hips.  He has been doing stretching exercises.  Primary osteoarthritis of both knees-he has off-and-on discomfort in his knee joints.  No warmth swelling or effusion was noted.  He has tight hamstrings.  Stretching exercises were demonstrated in the office.  Primary osteoarthritis of both feet-there is no history of Achilles tendinitis or planter fasciitis.  No synovitis was noted.  Idiopathic chronic gout without tophus, unspecified site-he has not had any gout flare.  His uric acid has been elevated.  Last uric acid was 7.8.   Ideally we should have uric acid below 6.0.  Dietary modifications were discussed.  I also recommended increasing allopurinol 100 mg, 2 tablets daily.  We will recheck uric acid with his next labs.  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  Follow-Up Instructions: Return in about 5 months (around 05/28/2022) for Psoriatic arthritis.   Bo Merino, MD  Note - This record has been created using Editor, commissioning.  Chart creation errors have been sought, but may not always  have been located. Such creation errors do not reflect on  the standard of medical care.

## 2021-12-26 ENCOUNTER — Ambulatory Visit: Payer: Managed Care, Other (non HMO) | Admitting: Rheumatology

## 2021-12-26 ENCOUNTER — Encounter: Payer: Self-pay | Admitting: Rheumatology

## 2021-12-26 VITALS — BP 124/75 | HR 79 | Resp 15 | Ht 70.0 in | Wt 223.0 lb

## 2021-12-26 DIAGNOSIS — R7989 Other specified abnormal findings of blood chemistry: Secondary | ICD-10-CM

## 2021-12-26 DIAGNOSIS — M533 Sacrococcygeal disorders, not elsewhere classified: Secondary | ICD-10-CM

## 2021-12-26 DIAGNOSIS — L4050 Arthropathic psoriasis, unspecified: Secondary | ICD-10-CM | POA: Diagnosis not present

## 2021-12-26 DIAGNOSIS — M170 Bilateral primary osteoarthritis of knee: Secondary | ICD-10-CM

## 2021-12-26 DIAGNOSIS — G8929 Other chronic pain: Secondary | ICD-10-CM

## 2021-12-26 DIAGNOSIS — L409 Psoriasis, unspecified: Secondary | ICD-10-CM | POA: Diagnosis not present

## 2021-12-26 DIAGNOSIS — M160 Bilateral primary osteoarthritis of hip: Secondary | ICD-10-CM

## 2021-12-26 DIAGNOSIS — Z79899 Other long term (current) drug therapy: Secondary | ICD-10-CM | POA: Diagnosis not present

## 2021-12-26 DIAGNOSIS — M1A00X0 Idiopathic chronic gout, unspecified site, without tophus (tophi): Secondary | ICD-10-CM

## 2021-12-26 DIAGNOSIS — M19072 Primary osteoarthritis, left ankle and foot: Secondary | ICD-10-CM

## 2021-12-26 DIAGNOSIS — M19041 Primary osteoarthritis, right hand: Secondary | ICD-10-CM | POA: Diagnosis not present

## 2021-12-26 DIAGNOSIS — Z840 Family history of diseases of the skin and subcutaneous tissue: Secondary | ICD-10-CM

## 2021-12-26 DIAGNOSIS — Z8719 Personal history of other diseases of the digestive system: Secondary | ICD-10-CM

## 2021-12-26 DIAGNOSIS — D804 Selective deficiency of immunoglobulin M [IgM]: Secondary | ICD-10-CM

## 2021-12-26 DIAGNOSIS — E782 Mixed hyperlipidemia: Secondary | ICD-10-CM

## 2021-12-26 DIAGNOSIS — M19042 Primary osteoarthritis, left hand: Secondary | ICD-10-CM

## 2021-12-26 DIAGNOSIS — M19071 Primary osteoarthritis, right ankle and foot: Secondary | ICD-10-CM

## 2021-12-26 NOTE — Patient Instructions (Signed)
Standing Labs We placed an order today for your standing lab work.   Please have your standing labs drawn in September and every 3 months  If possible, please have your labs drawn 2 weeks prior to your appointment so that the provider can discuss your results at your appointment.  Please note that you may see your imaging and lab results in MyChart before we have reviewed them. We may be awaiting multiple results to interpret others before contacting you. Please allow our office up to 72 hours to thoroughly review all of the results before contacting the office for clarification of your results.  We have open lab daily: Monday through Thursday from 1:30-4:30 PM and Friday from 1:30-4:00 PM at the office of Dr. Zed Wanninger, [facility].   Please be advised, all patients with office appointments requiring lab work will take precedent over walk-in lab work.  If possible, please come for your lab work on Monday and Friday afternoons, as you may experience shorter wait times. The office is located at [address], [address], [place], [place] [address] No appointment is necessary.   Labs are drawn by [facility]. Please bring your co-pay at the time of your lab draw.  You may receive a bill from [facility] for your lab work.  Please note if you are on Hydroxychloroquine and and an order has been placed for a Hydroxychloroquine level, you will need to have it drawn 4 hours or more after your last dose.  If you wish to have your labs drawn at another location, please call the office 24 hours in advance to send orders.  If you have any questions regarding directions or hours of operation,  please call [phone].   As a reminder, please drink plenty of water prior to coming for your lab work. Thanks!   Vaccines You are taking a medication(s) that can suppress your immune system.  The following immunizations are recommended: Flu annually Covid-19  Td/Tdap (tetanus, diphtheria,  pertussis) every 10 years Pneumonia (Prevnar 15 then Pneumovax 23 at least 1 year apart.  Alternatively, can take Prevnar 20 without needing additional dose) Shingrix: 2 doses from 4 weeks to 6 months apart  Please check with your PCP to make sure you are up to date.   If you have signs or symptoms of an infection or start antibiotics: First, call your PCP for workup of your infection. Hold your medication through the infection, until you complete your antibiotics, and until symptoms resolve if you take the following: Injectable medication (Actemra, Benlysta, Cimzia, Cosentyx, Enbrel, Humira, Kevzara, Orencia, Remicade, Simponi, Stelara, Taltz, Tremfya) Methotrexate Leflunomide (Arava) Mycophenolate (Cellcept) Xeljanz, Olumiant, or Rinvoq  

## 2022-01-06 ENCOUNTER — Other Ambulatory Visit: Payer: Self-pay | Admitting: Physician Assistant

## 2022-01-06 DIAGNOSIS — L409 Psoriasis, unspecified: Secondary | ICD-10-CM

## 2022-01-06 DIAGNOSIS — L4050 Arthropathic psoriasis, unspecified: Secondary | ICD-10-CM

## 2022-01-06 NOTE — Telephone Encounter (Signed)
Next Visit: 06/02/2022  Last Visit: 12/26/2021  Last Fill: 12/16/2021  DX: Psoriatic arthritis   Current Dose per office note 12/26/2021: Taltz 80 mg sq injections every 4 weeks-  Labs: 12/12/2021, CBC and CMP are normal.  TB Gold: 07/17/2021,   TB Gold is negative  Okay to refill Taltz?

## 2022-01-10 ENCOUNTER — Other Ambulatory Visit: Payer: Self-pay | Admitting: Physician Assistant

## 2022-01-10 DIAGNOSIS — L409 Psoriasis, unspecified: Secondary | ICD-10-CM

## 2022-01-10 DIAGNOSIS — L4050 Arthropathic psoriasis, unspecified: Secondary | ICD-10-CM

## 2022-01-29 ENCOUNTER — Ambulatory Visit (INDEPENDENT_AMBULATORY_CARE_PROVIDER_SITE_OTHER): Payer: Managed Care, Other (non HMO) | Admitting: Family Medicine

## 2022-01-29 ENCOUNTER — Encounter: Payer: Self-pay | Admitting: Family Medicine

## 2022-01-29 VITALS — BP 127/82 | HR 71 | Temp 97.6°F | Ht 70.0 in | Wt 226.4 lb

## 2022-01-29 DIAGNOSIS — E559 Vitamin D deficiency, unspecified: Secondary | ICD-10-CM | POA: Diagnosis not present

## 2022-01-29 DIAGNOSIS — L4050 Arthropathic psoriasis, unspecified: Secondary | ICD-10-CM

## 2022-01-29 DIAGNOSIS — Z125 Encounter for screening for malignant neoplasm of prostate: Secondary | ICD-10-CM

## 2022-01-29 DIAGNOSIS — R739 Hyperglycemia, unspecified: Secondary | ICD-10-CM

## 2022-01-29 DIAGNOSIS — D804 Selective deficiency of immunoglobulin M [IgM]: Secondary | ICD-10-CM

## 2022-01-29 DIAGNOSIS — Z0001 Encounter for general adult medical examination with abnormal findings: Secondary | ICD-10-CM | POA: Diagnosis not present

## 2022-01-29 DIAGNOSIS — M1000 Idiopathic gout, unspecified site: Secondary | ICD-10-CM

## 2022-01-29 DIAGNOSIS — Z1211 Encounter for screening for malignant neoplasm of colon: Secondary | ICD-10-CM | POA: Diagnosis not present

## 2022-01-29 DIAGNOSIS — R7989 Other specified abnormal findings of blood chemistry: Secondary | ICD-10-CM

## 2022-01-29 DIAGNOSIS — E785 Hyperlipidemia, unspecified: Secondary | ICD-10-CM

## 2022-01-29 DIAGNOSIS — I779 Disorder of arteries and arterioles, unspecified: Secondary | ICD-10-CM

## 2022-01-29 LAB — COMPREHENSIVE METABOLIC PANEL
ALT: 26 U/L (ref 0–53)
AST: 25 U/L (ref 0–37)
Albumin: 4.6 g/dL (ref 3.5–5.2)
Alkaline Phosphatase: 31 U/L — ABNORMAL LOW (ref 39–117)
BUN: 16 mg/dL (ref 6–23)
CO2: 29 mEq/L (ref 19–32)
Calcium: 9.5 mg/dL (ref 8.4–10.5)
Chloride: 104 mEq/L (ref 96–112)
Creatinine, Ser: 0.97 mg/dL (ref 0.40–1.50)
GFR: 83.58 mL/min (ref >60.00)
Glucose, Bld: 94 mg/dL (ref 70–99)
Potassium: 4.5 mEq/L (ref 3.5–5.1)
Sodium: 139 mEq/L (ref 135–145)
Total Bilirubin: 0.7 mg/dL (ref 0.2–1.2)
Total Protein: 7 g/dL (ref 6.0–8.3)

## 2022-01-29 LAB — TSH: TSH: 1.51 u[IU]/mL (ref 0.35–5.50)

## 2022-01-29 LAB — LIPID PANEL
Cholesterol: 262 mg/dL — ABNORMAL HIGH (ref 0–200)
HDL: 50.4 mg/dL (ref >39.00)
LDL Cholesterol: 187 mg/dL — ABNORMAL HIGH (ref 0–99)
NonHDL: 211.62
Total CHOL/HDL Ratio: 5
Triglycerides: 122 mg/dL (ref 0.0–149.0)
VLDL: 24.4 mg/dL (ref 0.0–40.0)

## 2022-01-29 LAB — VITAMIN D 25 HYDROXY (VIT D DEFICIENCY, FRACTURES): VITD: 41.71 ng/mL (ref 30.00–100.00)

## 2022-01-29 LAB — TESTOSTERONE: Testosterone: 469.85 ng/dL (ref 300.00–890.00)

## 2022-01-29 LAB — PSA: PSA: 0.67 ng/mL (ref 0.10–4.00)

## 2022-01-29 LAB — HEMOGLOBIN A1C: Hgb A1c MFr Bld: 6.3 % (ref 4.6–6.5)

## 2022-01-29 NOTE — Assessment & Plan Note (Signed)
Check labs 

## 2022-01-29 NOTE — Patient Instructions (Signed)
It was very nice to see you today!  We will check blood work today.  I will refer you for your colonoscopy.  We will see you back in 6 months for a follow-up visit.  We will see you back in year for your next visit.  Please come back to see [place] sooner if needed.  Take care, Dr Jerline Pain  PLEASE NOTE:  If you had any lab tests please let us know if you have not heard back within a few days. You may see your results on mychart before we have a chance to review them but we will give you a call once they are reviewed by [place]. If we ordered any referrals today, please let us know if you have not heard from their office within the next week.   Please try these tips to maintain a healthy lifestyle:  Eat at least 3 REAL meals and 1-2 snacks per day.  Aim for no more than 5 hours between eating.  If you eat breakfast, please do so within one hour of getting up.   Each meal should contain half fruits/vegetables, one quarter protein, and one quarter carbs (no bigger than a computer mouse)  Cut down on sweet beverages. This includes juice, soda, and sweet tea.   Drink at least 1 glass of water with each meal and aim for at least 8 glasses per day  Exercise at least 150 minutes every week.    Preventive Care 76-46 Years Old, Male Preventive care refers to lifestyle choices and visits with your health care provider that can promote health and wellness. Preventive care visits are also called wellness exams. What can I expect for my preventive care visit? Counseling During your preventive care visit, your health care provider may ask about your: Medical history, including: Past medical problems. Family medical history. Current health, including: Emotional well-being. Home life and relationship well-being. Sexual activity. Lifestyle, including: Alcohol, nicotine or tobacco, and drug use. Access to firearms. Diet, exercise, and sleep habits. Safety issues such as seatbelt and bike helmet  use. Sunscreen use. Work and work Statistician. Physical exam Your health care provider will check your: Height and weight. These may be used to calculate your BMI (body mass index). BMI is a measurement that tells if you are at a healthy weight. Waist circumference. This measures the distance around your waistline. This measurement also tells if you are at a healthy weight and may help predict your risk of certain diseases, such as type 2 diabetes and high blood pressure. Heart rate and blood pressure. Body temperature. Skin for abnormal spots. What immunizations do I need?  Vaccines are usually given at various ages, according to a schedule. Your health care provider will recommend vaccines for you based on your age, medical history, and lifestyle or other factors, such as travel or where you work. What tests do I need? Screening Your health care provider may recommend screening tests for certain conditions. This may include: Lipid and cholesterol levels. Diabetes screening. This is done by checking your blood sugar (glucose) after you have not eaten for a while (fasting). Hepatitis B test. Hepatitis C test. HIV (human immunodeficiency virus) test. STI (sexually transmitted infection) testing, if you are at risk. Lung cancer screening. Prostate cancer screening. Colorectal cancer screening. Talk with your health care provider about your test results, treatment options, and if necessary, the need for more tests. Follow these instructions at home: Eating and drinking  Eat a diet that includes fresh fruits and  vegetables, whole grains, lean protein, and low-fat dairy products. Take vitamin and mineral supplements as recommended by your health care provider. Do not drink alcohol if your health care provider tells you not to drink. If you drink alcohol: Limit how much you have to 0-2 drinks a day. Know how much alcohol is in your drink. In the [place] one drink equals one 12 oz bottle of  beer (355 mL), one 5 oz glass of wine (148 mL), or one 1 oz glass of hard liquor (44 mL). Lifestyle Brush your teeth every morning and night with fluoride toothpaste. Floss one time each day. Exercise for at least 30 minutes 5 or more days each week. Do not use any products that contain nicotine or tobacco. These products include cigarettes, chewing tobacco, and vaping devices, such as e-cigarettes. If you need help quitting, ask your health care provider. Do not use drugs. If you are sexually active, practice safe sex. Use a condom or other form of protection to prevent STIs. Take aspirin only as told by your health care provider. Make sure that you understand how much to take and what form to take. Work with your health care provider to find out whether it is safe and beneficial for you to take aspirin daily. Find healthy ways to manage stress, such as: Meditation, yoga, or listening to music. Journaling. Talking to a trusted person. Spending time with friends and family. Minimize exposure to UV radiation to reduce your risk of skin cancer. Safety Always wear your seat belt while driving or riding in a vehicle. Do not drive: If you have been drinking alcohol. Do not ride with someone who has been drinking. When you are tired or distracted. While texting. If you have been using any mind-altering substances or drugs. Wear a helmet and other protective equipment during sports activities. If you have firearms in your house, make sure you follow all gun safety procedures. What's next? Go to your health care provider once a year for an annual wellness visit. Ask your health care provider how often you should have your eyes and teeth checked. Stay up to date on all vaccines. This information is not intended to replace advice given to you by your health care provider. Make sure you discuss any questions you have with your health care provider. Document Revised: 12/19/2020 Document Reviewed:  12/19/2020 Elsevier Patient Education  English.

## 2022-01-29 NOTE — Assessment & Plan Note (Signed)
Rheumatology increased his allopurinol to 200 mg daily a couple of weeks ago.  It is too early to recheck his uric acid level today.  We can recheck at a later office visit.

## 2022-01-29 NOTE — Progress Notes (Signed)
Chief Complaint:  Jeffrey Klein is a 61 y.o. male who presents today for his annual comprehensive physical exam.    Assessment/Plan:  Chronic Problems Addressed Today: Vitamin D deficiency Check Vit D.  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  Psoriatic arthritis ([place]) Follows with rheumatology.  Currently on Taltz.  Doing well.  No recent exacerbations.  Low testosterone On testosterone replacement 50 mg topically daily.  We will check testosterone level today  Dyslipidemia On Crestor 10 mg daily.  Check lipids.  Gout Rheumatology increased his allopurinol to 200 mg daily a couple of weeks ago.  It is too early to recheck his uric acid level today.  We can recheck at a later office visit.  Preventative Healthcare: Check labs.  Will refer for colonoscopy.  Up-to-date on vaccines.  Patient Counseling(The following topics were reviewed and/or handout was given):  -Nutrition: Stressed importance of moderation in sodium/caffeine intake, saturated fat and cholesterol, caloric balance, sufficient intake of fresh fruits, vegetables, and fiber.  -Stressed the importance of regular exercise.   -Substance Abuse: Discussed cessation/primary prevention of tobacco, alcohol, or other drug use; driving or other dangerous activities under the influence; availability of treatment for abuse.   -Injury prevention: Discussed safety belts, safety helmets, smoke detector, smoking near bedding or upholstery.   -Sexuality: Discussed sexually transmitted diseases, partner selection, use of condoms, avoidance of unintended pregnancy and contraceptive alternatives.   -Dental health: Discussed importance of regular tooth brushing, flossing, and dental visits.  -Health maintenance and immunizations reviewed. Please refer to Health maintenance section.  Return to care in 1 year for next preventative visit.     Subjective:  HPI:  He has no acute complaints today. See A/p for status of chronic  conditions.   Lifestyle Diet: Balanced. Plenty of fruits and vegetables.  Exercise: Very active around the house. Tries to walking.      01/29/2022    8:25 AM  Depression screen PHQ 2/9  Decreased Interest 0  Down, Depressed, Hopeless 0  PHQ - 2 Score 0    Health Maintenance Due  Topic Date Due   COLONOSCOPY (Pts 45-56yr Insurance coverage will need to be confirmed)  Never done    ROS: Per HPI, otherwise a complete review of systems was negative.   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[place] 06/18/2020   Gout 11/18/2019   Dyslipidemia 11/18/2019   Low testosterone 11/18/2019   Diverticulosis 11/18/2019   Psoriatic arthritis ([place] 11/18/2019   Past Surgical History:  Procedure Laterality Date   KNEE ARTHROSCOPY Bilateral    LASIK Bilateral 2006   TONSILLECTOMY     as a child    WRIST SURGERY Right     Family History  Problem Relation Age of Onset   Psoriasis Sister    Psoriasis Paternal Grandmother    Healthy Daughter    ADD / ADHD Son    Drug abuse Son     Medications-  reviewed and updated Current Outpatient Medications  Medication Sig Dispense Refill   allopurinol (ZYLOPRIM) 100 MG tablet TAKE ONE TABLET BY MOUTH ONE TIME DAILY 90 tablet 0   ASPIRIN 81 PO Take by mouth daily.     betamethasone valerate (VALISONE) 0.1 % cream Apply topically once daily as needed 45 g 0   Misc Natural Products (JOINT SUPPORT PO) Take 1 tablet  by mouth daily.     Omega-3 Fatty Acids (FISH OIL PO) Take by mouth daily.     rosuvastatin (CRESTOR) 10 MG tablet Take 1 tablet (10 mg total) by mouth at bedtime. 90 tablet 1   TALTZ 80 MG/ML SOSY INJECT 80 MG (1 SYRINGE) UNDER THE SKIN EVERY 4 WEEKS 3 mL 0   Testosterone 12.5 MG/ACT (1%) GEL Place 50 mg onto the skin daily. 150 g 3   VITAMIN D PO Take by mouth daily.     No current facility-administered medications for this visit.    Allergies-reviewed and updated No Known Allergies  Social History   Socioeconomic History   Marital status: Divorced    Spouse name: Not on file   Number of children: Not on file   Years of education: Not on file   Highest education level: Not on file  Occupational History   Not on file  Tobacco Use   Smoking status: Never   Smokeless tobacco: Never  Vaping Use   Vaping Use: Never used  Substance and Sexual Activity   Alcohol use: Yes    Alcohol/week: 5.0 standard drinks of alcohol    Types: 1 Glasses of wine, 4 Cans of beer per week   Drug use: Never   Sexual activity: Not on file  Other Topics Concern   Not on file  Social History Narrative   Not on file   Social Determinants of Health   Financial Resource Strain: Not on file  Food Insecurity: Not on file  Transportation Needs: Not on file  Physical Activity: Not on file  Stress: Not on file  Social Connections: Not on file        Objective:  Physical Exam: BP 127/82   Pulse 71   Temp 97.6 F (36.4 C) (Temporal)   Ht '5\' 10"'$  (1.778 m)   Wt 226 lb 6.4 oz (102.7 kg)   SpO2 96%   BMI 32.49 kg/m   Body mass index is 32.49 kg/m. Wt Readings from Last 3 Encounters:  01/29/22 226 lb 6.4 oz (102.7 kg)  12/26/21 223 lb (101.2 kg)  08/01/21 227 lb 12.8 oz (103.3 kg)   Gen: NAD, resting comfortably HEENT: TMs normal bilaterally. OP clear. No thyromegaly noted.  CV: RRR with no murmurs appreciated Pulm: NWOB, CTAB with no crackles, wheezes, or rhonchi GI: Normal bowel sounds  present. Soft, Nontender, Nondistended. MSK: no edema, cyanosis, or clubbing noted Skin: warm, dry Neuro: CN2-12 grossly intact. Strength 5/5 in upper and lower extremities. Reflexes symmetric and intact bilaterally.  Psych: Normal affect and thought content     Anntonette Madewell M. Jerline Pain, MD 01/29/2022 8:52 AM

## 2022-01-29 NOTE — Assessment & Plan Note (Signed)
On testosterone replacement 50 mg topically daily.  We will check testosterone level today

## 2022-01-29 NOTE — Assessment & Plan Note (Signed)
Check Vit D 

## 2022-01-29 NOTE — Assessment & Plan Note (Signed)
On Crestor 10 mg daily.  Check lipids. 

## 2022-01-29 NOTE — Assessment & Plan Note (Signed)
Follows with rheumatology.  Currently on Taltz.  Doing well.  No recent exacerbations.

## 2022-01-30 LAB — CBC
HCT: 47.2 % (ref 39.0–52.0)
Hemoglobin: 15.7 g/dL (ref 13.0–17.0)
MCHC: 33.2 g/dL (ref 30.0–36.0)
MCV: 89.2 fl (ref 78.0–100.0)
Platelets: 292 10*3/uL (ref 150.0–400.0)
RBC: 5.28 Mil/uL (ref 4.22–5.81)
RDW: 14.4 % (ref 11.5–15.5)
WBC: 4.5 10*3/uL (ref 4.0–10.5)

## 2022-01-31 NOTE — Progress Notes (Signed)
Please inform patient of the following:  His cholesterol levels are much higher than last year.  Please verify with patient that he has been taking the Crestor 10 mg daily.  If he has we should increase the dose to 20 mg daily.  Please send in a prescription at needed.  All of his other labs are normal.  We can recheck in a 6-12 months.

## 2022-02-04 NOTE — Progress Notes (Signed)
Tried to call pt, message saying "call could not be completed as dialed". Will try again at a later time.

## 2022-02-06 ENCOUNTER — Encounter: Payer: Self-pay | Admitting: Family Medicine

## 2022-02-06 ENCOUNTER — Ambulatory Visit: Payer: Managed Care, Other (non HMO) | Admitting: Family Medicine

## 2022-02-06 VITALS — BP 116/74 | HR 75 | Temp 97.9°F | Ht 70.0 in | Wt 226.8 lb

## 2022-02-06 DIAGNOSIS — M1000 Idiopathic gout, unspecified site: Secondary | ICD-10-CM | POA: Diagnosis not present

## 2022-02-06 DIAGNOSIS — E785 Hyperlipidemia, unspecified: Secondary | ICD-10-CM

## 2022-02-06 MED ORDER — ALLOPURINOL 100 MG PO TABS
100.0000 mg | ORAL_TABLET | Freq: Every day | ORAL | 3 refills | Status: DC
Start: 2022-02-06 — End: 2022-11-14

## 2022-02-06 MED ORDER — ROSUVASTATIN CALCIUM 10 MG PO TABS: 10.0000 mg | ORAL_TABLET | Freq: Every day | ORAL | 1 refills | Status: DC

## 2022-02-06 NOTE — Patient Instructions (Signed)
It was very nice to see you today!  I will send in more of your Crestor and allopurinol.  We will see you back in 6 months for your regular follow-up visit.  Please come back to see [place] sooner if needed.  Take care, Dr Jerline Pain  PLEASE NOTE:  If you had any lab tests please let us know if you have not heard back within a few days. You may see your results on mychart before we have a chance to review them but we will give you a call once they are reviewed by [place]. If we ordered any referrals today, please let us know if you have not heard from their office within the next week.   Please try these tips to maintain a healthy lifestyle:  Eat at least 3 REAL meals and 1-2 snacks per day.  Aim for no more than 5 hours between eating.  If you eat breakfast, please do so within one hour of getting up.   Each meal should contain half fruits/vegetables, one quarter protein, and one quarter carbs (no bigger than a computer mouse)  Cut down on sweet beverages. This includes juice, soda, and sweet tea.   Drink at least 1 glass of water with each meal and aim for at least 8 glasses per day  Exercise at least 150 minutes every week.

## 2022-02-06 NOTE — Assessment & Plan Note (Signed)
Had lengthy discussion with patient regarding his mostly recent lipid levels.  His LDL more than doubled from 91 to over 180.  Patient does admit he stopped taking his Crestor due to having recent [employer] screening that did not show any plaque or calcium buildup.  He had a cardiac CT scan about 3 years ago which did not show any good.  Prior to this he had been tolerating his Crestor well.  No significant side effects though he was worried about long-term adverse effects.  We discussed risk and benefits of continuing Crestor.  Discussed reduce risk of heart attack and stroke especially considering his significantly elevated LDL levels.  He is agreeable to restart Crestor 10 mg daily.  We can recheck lipids in 6 months.

## 2022-02-06 NOTE — Assessment & Plan Note (Signed)
Stable.  No recent flares.  Allopurinol refilled.

## 2022-02-06 NOTE — Progress Notes (Signed)
   Jeffrey Klein is a 61 y.o. male who presents today for an office visit.  Assessment/Plan:  Chronic Problems Addressed Today: Dyslipidemia Had lengthy discussion with patient regarding his mostly recent lipid levels.  His LDL more than doubled from 91 to over 180.  Patient does admit he stopped taking his Crestor due to having recent [employer] screening that did not show any plaque or calcium buildup.  He had a cardiac CT scan about 3 years ago which did not show any good.  Prior to this he had been tolerating his Crestor well.  No significant side effects though he was worried about long-term adverse effects.  We discussed risk and benefits of continuing Crestor.  Discussed reduce risk of heart attack and stroke especially considering his significantly elevated LDL levels.  He is agreeable to restart Crestor 10 mg daily.  We can recheck lipids in 6 months.  Gout Stable.  No recent flares.  Allopurinol refilled.     Subjective:  HPI:  See A/p for status of chronic conditions.          Objective:  Physical Exam: BP 116/74   Pulse 75   Temp 97.9 F (36.6 C) (Temporal)   Ht '5\' 10"'$  (1.778 m)   Wt 226 lb 12.8 oz (102.9 kg)   SpO2 96%   BMI 32.54 kg/m   Gen: No acute distress, resting comfortably Neuro: Grossly normal, moves all extremities Psych: Normal affect and thought content      Jeffrey Klein M. Jerline Pain, MD 02/06/2022 9:09 AM

## 2022-02-17 ENCOUNTER — Encounter: Payer: Self-pay | Admitting: Family Medicine

## 2022-03-04 ENCOUNTER — Ambulatory Visit: Payer: BC Managed Care – PPO | Admitting: Dermatology

## 2022-03-15 ENCOUNTER — Other Ambulatory Visit: Payer: Self-pay | Admitting: Family Medicine

## 2022-03-29 ENCOUNTER — Other Ambulatory Visit: Payer: Self-pay | Admitting: Physician Assistant

## 2022-03-29 DIAGNOSIS — L409 Psoriasis, unspecified: Secondary | ICD-10-CM

## 2022-03-29 DIAGNOSIS — L4050 Arthropathic psoriasis, unspecified: Secondary | ICD-10-CM

## 2022-03-31 NOTE — Telephone Encounter (Signed)
Next Visit: 06/02/2022  Last Visit: 12/26/2021  Last Fill: 01/06/2022  [id number] arthritis   Current Dose per office note 12/26/2021: Taltz 80 mg subcu monthly  Labs: 01/29/2022 Alk Phos 31  TB Gold: 07/17/2021 Neg    Okay to refill Taltz?

## 2022-05-01 ENCOUNTER — Telehealth: Payer: Self-pay | Admitting: Gastroenterology

## 2022-05-01 NOTE — Telephone Encounter (Signed)
05/01/2022 Supervising MD, PM    Hi Dr. Bryan Lemma,  We received a referral for this patient for a colonoscopy. Patient is now located in [place] seeking GI care no preference in provider. I have records and I will be sending records for review. Please advise on scheduling.    Thank you

## 2022-05-06 ENCOUNTER — Encounter: Payer: Self-pay | Admitting: Gastroenterology

## 2022-05-06 NOTE — Telephone Encounter (Signed)
Hi Dr.Cirigliano,    Patient is scheduled for 12/4 and a pre-visit on 11/2.  Thank you

## 2022-05-06 NOTE — Telephone Encounter (Signed)
Records received and reviewed and notable for the following:  - 08/29/2016: Colonoscopy [facility]): Single 5 mm transverse colon adenoma, sigmoid diverticulosis.  Repeat 5 years - 12/25/2016: Evaluated by Dr. Bennett Scrape at [facility] for painless hematochezia, presumed hemorrhoidal.  Was treated with fiber supplement and conservative management  Based on these results and recommendation from previous Gastroenterologist, in the absence of active GI symptoms requiring evaluation, reasonable to schedule direct for colonoscopy with me in the [facility] for ongoing polyp surveillance.  Reviewed medication list, and currently prescribed ASA 81 mg, but no other antiplatelet therapy or anticoagulation.  Ok to continue ASA 81 mg in the perioperative setting.

## 2022-05-08 ENCOUNTER — Ambulatory Visit (AMBULATORY_SURGERY_CENTER): Payer: Self-pay | Admitting: *Deleted

## 2022-05-08 VITALS — Ht 70.0 in | Wt 226.2 lb

## 2022-05-08 DIAGNOSIS — Z86010 Personal history of colonic polyps: Secondary | ICD-10-CM

## 2022-05-08 MED ORDER — NA SULFATE-K SULFATE-MG SULF 17.5-3.13-1.6 GM/177ML PO SOLN: 1.0000 | Freq: Once | ORAL | 0 refills | Status: AC

## 2022-05-08 NOTE — Progress Notes (Signed)
No egg or soy allergy known to patient  No issues known to pt with past sedation with any surgeries or procedures Pt denies trouble neck No FH of Malignant Hyperthermia Pt is not on diet pills Pt is not on  home 02  Pt is not on blood thinners  Pt denies issues with constipation  Pt encouraged to use to use Singlecare or Goodrx to reduce cost

## 2022-05-19 NOTE — Progress Notes (Signed)
Office Visit Note  Patient: Jeffrey Klein             Date of Birth: October 13, 1960           MRN: [id number]             PCP: Vivi Barrack, MD Referring: Vivi Barrack, MD Visit Date: 06/02/2022 Occupation: '@GUAROCC'$ @  Subjective:  Medication monitoring   History of Present Illness: Jeffrey Klein is a 61 y.o. male with history of psoriatic arthritis and osteoarthritis.  He is currently on taltz 80 mg sq injections every 4 weeks. He is tolerating taltz without any side effects.  He denies any signs or symptoms of a psoriatic arthritis flare.  He denies any new patches of plaque psoriasis.  He still has a small patch in the umbilical region which has been unchanged over the years.  He denies any Achilles tendinitis or plantar fasciitis.  He has occasional swelling in his knees typically after kneeling for prolonged periods of time while performing household activities or laying tile.  He denies any other joint pain or joint swelling at this time.  He denies any signs or symptoms of a gout flare.  He is clinically doing well taking allopurinol 100 mg daily.  He denies any recent or recurrent infections.  He received the annual flu shot on 05/16/2022.     Activities of Daily Living:  Patient reports morning stiffness for 3 minutes.   Patient Reports nocturnal pain.  Difficulty dressing/grooming: Denies Difficulty climbing stairs: Denies Difficulty getting out of chair: Denies Difficulty using hands for taps, buttons, cutlery, and/or writing: Denies  Review of Systems  Constitutional:  Negative for fatigue.  HENT:  Negative for mouth sores and mouth dryness.   Eyes:  Positive for itching and dryness.  Respiratory:  Negative for shortness of breath.   Cardiovascular:  Negative for chest pain and palpitations.  Gastrointestinal:  Negative for blood in stool, constipation and diarrhea.  Endocrine: Negative for increased urination.  Genitourinary:  Negative for involuntary urination.   Musculoskeletal:  Positive for joint pain, joint pain, joint swelling, morning stiffness and muscle tenderness. Negative for gait problem, myalgias, muscle weakness and myalgias.  Skin:  Negative for color change, rash, hair loss and sensitivity to sunlight.  Allergic/Immunologic: Negative for susceptible to infections.  Neurological:  Negative for dizziness and headaches.  Hematological:  Negative for swollen glands.  Psychiatric/Behavioral:  Negative for depressed mood and sleep disturbance. The patient is not nervous/anxious.     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   Gout 11/18/2019   Dyslipidemia 11/18/2019   Low testosterone 11/18/2019   Diverticulosis 11/18/2019   Psoriatic arthritis ([place]) 11/18/2019    Past Medical History:  Diagnosis Date   Diverticulosis    diverticulitis in 2018, no recurrence   GERD (gastroesophageal reflux disease)    High cholesterol    Psoriasis    Psoriatic arthritis ([place])     Family History  Problem Relation Age of Onset   Psoriasis Sister    Psoriasis Paternal 67    Healthy Daughter    ADD / ADHD Son  Drug abuse Son    Colon cancer Neg Hx    Esophageal cancer Neg Hx    Rectal cancer Neg Hx    Stomach cancer Neg Hx    Past Surgical History:  Procedure Laterality Date   COLONOSCOPY     x3   dental surgeries     KNEE ARTHROSCOPY Bilateral    LASIK Bilateral 2006   TONSILLECTOMY     as a child    WRIST SURGERY Right    Social History   Social History Narrative   Not on file   Immunization History  Administered Date(s) Administered    Influenza-Unspecified 02/21/2021   PFIZER(Purple Top)SARS-COV-2 Vaccination 10/06/2019, 10/31/2019, 03/05/2020   Pneumococcal-Unspecified 08/19/2018   Tdap 11/19/2020   Zoster Recombinat (Shingrix) 04/05/2019, 06/17/2019     Objective: Vital Signs: BP 125/76 (BP Location: Left Arm, Patient Position: Sitting, Cuff Size: Large)   Pulse 72   Resp 12   Ht 5' 10.5" (1.791 m)   Wt 226 lb (102.5 kg)   BMI 31.97 kg/m    Physical Exam Vitals and nursing note reviewed.  Constitutional:      Appearance: He is well-developed.  HENT:     Head: Normocephalic and atraumatic.  Eyes:     Conjunctiva/sclera: Conjunctivae normal.     Pupils: Pupils are equal, round, and reactive to light.  Cardiovascular:     Rate and Rhythm: Normal rate and regular rhythm.     Heart sounds: Normal heart sounds.  Pulmonary:     Effort: Pulmonary effort is normal.     Breath sounds: Normal breath sounds.  Abdominal:     General: Bowel sounds are normal.     Palpations: Abdomen is soft.  Musculoskeletal:     Cervical back: Normal range of motion and neck supple.  Skin:    General: Skin is warm and dry.     Capillary Refill: Capillary refill takes less than 2 seconds.  Neurological:     Mental Status: He is alert and oriented to person, place, and time.  Psychiatric:        Behavior: Behavior normal.      Musculoskeletal Exam: C-spine, thoracic spine, lumbar spine have good range of motion.  No midline spinal tenderness or SI joint tenderness upon palpation.  Shoulder joints, elbow joints, wrist joints, MCPs, PIPs, DIPs have good range of motion with no synovitis.  PIP and DIP thickening consistent with osteoarthritis symptomatic arthritis overlap.  Complete fist formation bilaterally.  Hip joints have good range of motion with no groin pain.  Knee joints have good range of motion with no warmth or effusion.  Ankle joints have good range of motion with no tenderness or joint swelling.  No evidence of Achilles  tendinitis or plantar fasciitis.  CDAI Exam: CDAI Score: -- Patient Global: --; Provider Global: -- Swollen: --; Tender: -- Joint Exam 06/02/2022   No joint exam has been documented for this visit   There is currently no information documented on the homunculus. Go to the Rheumatology activity and complete the homunculus joint exam.  Investigation: No additional findings.  Imaging: No results found.  Recent Labs: Lab Results  Component Value Date   WBC 4.5 01/29/2022   HGB 15.7 01/29/2022   PLT 292.0 01/29/2022   NA 139 01/29/2022   K 4.5 01/29/2022   CL 104 01/29/2022   CO2 29 01/29/2022   GLUCOSE 94 01/29/2022   BUN 16 01/29/2022   CREATININE 0.97 01/29/2022   BILITOT 0.7 01/29/2022  ALKPHOS 31 (L) 01/29/2022   AST 25 01/29/2022   ALT 26 01/29/2022   PROT 7.0 01/29/2022   ALBUMIN 4.6 01/29/2022   CALCIUM 9.5 01/29/2022   GFRAA 107 08/16/2020   QFTBGOLDPLUS NEGATIVE 07/17/2021    Speciality Comments: Inadequate response to Enbrel, Stelara, Cosentyx. He has been on [place] for 6 years.  Procedures:  No procedures performed Allergies: Patient has no known allergies.     Assessment / Plan:     Visit Diagnoses: Psoriatic arthritis ([place]): He has no synovitis or dactylitis on examination today.  He has not had any signs or symptoms of a psoriatic arthritis flare.  He has clinically been doing well taking Taltz 80 mg subcutaneous injections every 4 weeks.  He has been tolerating Taltz without any side effects or injection site reactions.  He has not had any recent or recurrent infections.  He has no evidence of Achilles tendinitis or plantar fasciitis.  No SI joint tenderness upon palpation.  No new patches of plaque psoriasis noted.  He will remain on Toltz as prescribed.  He was advised to notify us if he develops signs or symptoms of a flare.  He will follow-up in the office in 5 months or sooner if needed.  Psoriasis - Diagnosed in 1985.  No new patches of plaque  psoriasis.  High risk medication use - Taltz 80 mg sq injections every 4 weeks-started 09/23/16-initially prescribed by rheum in [place].Inadequate response to Enbrel, Stelara, and Cosentyx. - Plan: CBC with Differential/Platelet, COMPLETE METABOLIC PANEL WITH GFR, QuantiFERON-TB Gold Plus TB gold negative on 07/17/21.  Future order for TB gold placed today. CBC and CMP drawn on 01/29/22. Orders for CBC and CMP released today.  His next lab work will be due in February and every 3  months.  Standing orders for CBC and CMP remain in place.  Discussed the importance of holding taltz if he develops signs or symptoms of an infection and to resume once the infection has completely cleared.  He has not had any recent or recurrent infections.  He receives annual flu shot on 05/16/2022.  Primary osteoarthritis of both hands: PIP and DIP thickening consistent with osteoarthritis and psoriatic arthritis overlap.  No active synovitis or dactylitis noted on examination today.  Complete fist formation bilaterally.  Chronic SI joint pain: No SI joint tenderness upon palpation.  Primary osteoarthritis of both hips: He has good range of motion of both hip joints on examination today.  No groin pain noted.  He has been performing hip exercises on a daily basis.  Primary osteoarthritis of both knees: He has good range of motion of both knee joints on examination today.  No warmth or effusion noted.  He experiences occasional swelling in his knees especially after kneeling.  Primary osteoarthritis of both feet: He is not experiencing any increased discomfort in his feet at this time.  He has no evidence of Achilles tendinitis or plantar fasciitis.  He is wearing proper fitting shoes.  Idiopathic chronic gout without tophus, unspecified site - He has not had any signs or symptoms of a gout flare.  He has clinically been doing well taking allopurinol 100 mg daily prescribed by Dr. Jerline Pain.   uric acid: 7.8 on 07/17/2021.   Discussed that ideally his uric acid level should remain less than 6.  According to the patient he has not had a flare in many years.  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     Follow-Up Instructions: Return in about 5 months (around 11/01/2022) for Psoriatic arthritis, Osteoarthritis.   Ofilia Neas, PA-C  Note - This record has been created using Dragon software.  Chart creation errors have been sought, but may not always  have been located. Such creation errors do not reflect on  the standard of medical care.

## 2022-05-29 ENCOUNTER — Other Ambulatory Visit: Payer: Self-pay | Admitting: Rheumatology

## 2022-05-29 DIAGNOSIS — L409 Psoriasis, unspecified: Secondary | ICD-10-CM

## 2022-05-29 DIAGNOSIS — L4050 Arthropathic psoriasis, unspecified: Secondary | ICD-10-CM

## 2022-06-02 ENCOUNTER — Encounter: Payer: Self-pay | Admitting: Physician Assistant

## 2022-06-02 ENCOUNTER — Ambulatory Visit: Payer: Managed Care, Other (non HMO) | Attending: Physician Assistant | Admitting: Physician Assistant

## 2022-06-02 VITALS — BP 125/76 | HR 72 | Resp 12 | Ht 70.5 in | Wt 226.0 lb

## 2022-06-02 DIAGNOSIS — M170 Bilateral primary osteoarthritis of knee: Secondary | ICD-10-CM

## 2022-06-02 DIAGNOSIS — D804 Selective deficiency of immunoglobulin M [IgM]: Secondary | ICD-10-CM

## 2022-06-02 DIAGNOSIS — E782 Mixed hyperlipidemia: Secondary | ICD-10-CM

## 2022-06-02 DIAGNOSIS — Z79899 Other long term (current) drug therapy: Secondary | ICD-10-CM

## 2022-06-02 DIAGNOSIS — L4050 Arthropathic psoriasis, unspecified: Secondary | ICD-10-CM

## 2022-06-02 DIAGNOSIS — G8929 Other chronic pain: Secondary | ICD-10-CM

## 2022-06-02 DIAGNOSIS — M160 Bilateral primary osteoarthritis of hip: Secondary | ICD-10-CM

## 2022-06-02 DIAGNOSIS — M19072 Primary osteoarthritis, left ankle and foot: Secondary | ICD-10-CM

## 2022-06-02 DIAGNOSIS — Z840 Family history of diseases of the skin and subcutaneous tissue: Secondary | ICD-10-CM

## 2022-06-02 DIAGNOSIS — L409 Psoriasis, unspecified: Secondary | ICD-10-CM | POA: Diagnosis not present

## 2022-06-02 DIAGNOSIS — M1A00X0 Idiopathic chronic gout, unspecified site, without tophus (tophi): Secondary | ICD-10-CM

## 2022-06-02 DIAGNOSIS — M19042 Primary osteoarthritis, left hand: Secondary | ICD-10-CM

## 2022-06-02 DIAGNOSIS — M533 Sacrococcygeal disorders, not elsewhere classified: Secondary | ICD-10-CM

## 2022-06-02 DIAGNOSIS — M19071 Primary osteoarthritis, right ankle and foot: Secondary | ICD-10-CM

## 2022-06-02 DIAGNOSIS — M19041 Primary osteoarthritis, right hand: Secondary | ICD-10-CM | POA: Diagnosis not present

## 2022-06-02 DIAGNOSIS — Z8719 Personal history of other diseases of the digestive system: Secondary | ICD-10-CM

## 2022-06-02 NOTE — Progress Notes (Signed)
CBC WNL

## 2022-06-02 NOTE — Patient Instructions (Signed)
Standing Labs We placed an order today for your standing lab work.   Please have your standing labs drawn in February and every 3 months   Please have your labs drawn 2 weeks prior to your appointment so that the provider can discuss your lab results at your appointment.  Please note that you may see your imaging and lab results in MyChart before we have reviewed them. We will contact you once all results are reviewed. Please allow our office up to 72 hours to thoroughly review all of the results before contacting the office for clarification of your results.  Lab hours are:   Monday through Thursday from 8:00 am -12:30 pm and 1:00 pm-5:00 pm and Friday from 8:00 am-12:00 pm.  Please be advised, all patients with office appointments requiring lab work will take precedent over walk-in lab work.   Labs are drawn by [facility]. Please bring your co-pay at the time of your lab draw.  You may receive a bill from [facility] for your lab work.  Please note if you are on Hydroxychloroquine and and an order has been placed for a Hydroxychloroquine level, you will need to have it drawn 4 hours or more after your last dose.  If you wish to have your labs drawn at another location, please call the office 24 hours in advance so we can fax the orders.  The office is located at [address], [address], [place], [place] [address] No appointment is necessary.    If you have any questions regarding directions or hours of operation,  please call [phone].   As a reminder, please drink plenty of water prior to coming for your lab work. Thanks!  If you have signs or symptoms of an infection or start antibiotics: First, call your PCP for workup of your infection. Hold your medication through the infection, until you complete your antibiotics, and until symptoms resolve if you take the following: Injectable medication (Actemra, Benlysta, Cimzia, Cosentyx, Enbrel, Humira, Kevzara, Orencia, Remicade, Simponi,  Stelara, Taltz, Tremfya) Methotrexate Leflunomide (Arava) Mycophenolate (Cellcept) Xeljanz, Olumiant, or Rinvoq Vaccines You are taking a medication(s) that can suppress your immune system.  The following immunizations are recommended: Flu annually Covid-19  Td/Tdap (tetanus, diphtheria, pertussis) every 10 years Pneumonia (Prevnar 15 then Pneumovax 23 at least 1 year apart.  Alternatively, can take Prevnar 20 without needing additional dose) Shingrix: 2 doses from 4 weeks to 6 months apart  Please check with your PCP to make sure you are up to date.   

## 2022-06-02 NOTE — Telephone Encounter (Signed)
Next Visit: 11/03/2022  Last Visit: 06/02/2022  Last Fill: 03/31/2022  DX: Psoriatic arthritis   Current Dose per office note 06/02/2022: Taltz 80 mg sq injections every 4 weeks   Labs: 01/29/2022 Alk Phos 31 (Patient updated labs in office today)  TB Gold: 07/17/2021 Neg    Okay to refill Taltz?

## 2022-06-03 LAB — CBC WITH DIFFERENTIAL/PLATELET
Absolute Monocytes: 598 cells/uL (ref 200–950)
Basophils Absolute: 29 cells/uL (ref 0–200)
Basophils Relative: 0.4 %
Eosinophils Absolute: 158 cells/uL (ref 15–500)
Eosinophils Relative: 2.2 %
HCT: 47.2 % (ref 38.5–50.0)
Hemoglobin: 15.7 g/dL (ref 13.2–17.1)
Lymphs Abs: 1404 cells/uL (ref 850–3900)
MCH: 29.8 pg (ref 27.0–33.0)
MCHC: 33.3 g/dL (ref 32.0–36.0)
MCV: 89.7 fL (ref 80.0–100.0)
MPV: 9.3 fL (ref 7.5–12.5)
Monocytes Relative: 8.3 %
Neutro Abs: 5011 cells/uL (ref 1500–7800)
Neutrophils Relative %: 69.6 %
Platelets: 294 10*3/uL (ref 140–400)
RBC: 5.26 10*6/uL (ref 4.20–5.80)
RDW: 12.8 % (ref 11.0–15.0)
Total Lymphocyte: 19.5 %
WBC: 7.2 10*3/uL (ref 3.8–10.8)

## 2022-06-03 LAB — COMPLETE METABOLIC PANEL WITH GFR
AG Ratio: 1.9 (calc) (ref 1.0–2.5)
ALT: 39 U/L (ref 9–46)
AST: 31 U/L (ref 10–35)
Albumin: 4.5 g/dL (ref 3.6–5.1)
Alkaline phosphatase (APISO): 34 U/L — ABNORMAL LOW (ref 35–144)
BUN: 11 mg/dL (ref 7–25)
CO2: 30 mmol/L (ref 20–32)
Calcium: 9.6 mg/dL (ref 8.6–10.3)
Chloride: 104 mmol/L (ref 98–110)
Creat: 1.05 mg/dL (ref 0.70–1.35)
Globulin: 2.4 g/dL (calc) (ref 1.9–3.7)
Glucose, Bld: 109 mg/dL — ABNORMAL HIGH (ref 65–99)
Potassium: 4.5 mmol/L (ref 3.5–5.3)
Sodium: 141 mmol/L (ref 135–146)
Total Bilirubin: 0.5 mg/dL (ref 0.2–1.2)
Total Protein: 6.9 g/dL (ref 6.1–8.1)
eGFR: 80 mL/min/{1.73_m2} (ref >=60)

## 2022-06-03 LAB — URIC ACID: Uric Acid, Serum: 6.2 mg/dL (ref 4.0–8.0)

## 2022-06-03 NOTE — Progress Notes (Signed)
Glucose was 109.  Alk phos is borderline low but has improved.  Rest of CMP within normal limits. Uric acid was 6.2 which is an improvement.  Discussed that ideally his uric acid should be less than 6 but he has not had any gout flares in several years.  He is taking allopurinol as prescribed by his PCP.

## 2022-06-04 ENCOUNTER — Encounter: Payer: Self-pay | Admitting: Gastroenterology

## 2022-06-09 ENCOUNTER — Encounter: Payer: Self-pay | Admitting: Gastroenterology

## 2022-06-09 ENCOUNTER — Ambulatory Visit (AMBULATORY_SURGERY_CENTER): Payer: Managed Care, Other (non HMO) | Admitting: Gastroenterology

## 2022-06-09 VITALS — BP 147/94 | HR 74 | Temp 98.4°F | Resp 11 | Ht 70.0 in | Wt 226.2 lb

## 2022-06-09 DIAGNOSIS — K5730 Diverticulosis of large intestine without perforation or abscess without bleeding: Secondary | ICD-10-CM

## 2022-06-09 DIAGNOSIS — Z86010 Personal history of colonic polyps: Secondary | ICD-10-CM

## 2022-06-09 DIAGNOSIS — D125 Benign neoplasm of sigmoid colon: Secondary | ICD-10-CM | POA: Diagnosis not present

## 2022-06-09 DIAGNOSIS — K640 First degree hemorrhoids: Secondary | ICD-10-CM | POA: Diagnosis not present

## 2022-06-09 DIAGNOSIS — Z1211 Encounter for screening for malignant neoplasm of colon: Secondary | ICD-10-CM

## 2022-06-09 DIAGNOSIS — Z09 Encounter for follow-up examination after completed treatment for conditions other than malignant neoplasm: Secondary | ICD-10-CM

## 2022-06-09 MED ORDER — SODIUM CHLORIDE 0.9 % IV SOLN: 500.0000 mL | Freq: Once | INTRAVENOUS | Status: DC

## 2022-06-09 NOTE — Progress Notes (Signed)
Called to room to assist during endoscopic procedure.  Patient ID and intended procedure confirmed with present staff. Received instructions for my participation in the procedure from the performing physician.  

## 2022-06-09 NOTE — Patient Instructions (Signed)
Discharge instructions given. Handouts on polyps,diverticulosis and hemorrhoids. Resume previous medications. YOU HAD AN ENDOSCOPIC PROCEDURE TODAY AT THE [facility]:   Refer to the procedure report that was given to you for any specific questions about what was found during the examination.  If the procedure report does not answer your questions, please call your gastroenterologist to clarify.  If you requested that your care partner not be given the details of your procedure findings, then the procedure report has been included in a sealed envelope for you to review at your convenience later.  YOU SHOULD EXPECT: Some feelings of bloating in the abdomen. Passage of more gas than usual.  Walking can help get rid of the air that was put into your GI tract during the procedure and reduce the bloating. If you had a lower endoscopy (such as a colonoscopy or flexible sigmoidoscopy) you may notice spotting of blood in your stool or on the toilet paper. If you underwent a bowel prep for your procedure, you may not have a normal bowel movement for a few days.  Please Note:  You might notice some irritation and congestion in your nose or some drainage.  This is from the oxygen used during your procedure.  There is no need for concern and it should clear up in a day or so.  SYMPTOMS TO REPORT IMMEDIATELY:  Following lower endoscopy (colonoscopy or flexible sigmoidoscopy):  Excessive amounts of blood in the stool  Significant tenderness or worsening of abdominal pains  Swelling of the abdomen that is new, acute  Fever of 100F or higher   For urgent or emergent issues, a gastroenterologist can be reached at any hour by calling [phone]. Do not use MyChart messaging for urgent concerns.    DIET:  We do recommend a small meal at first, but then you may proceed to your regular diet.  Drink plenty of fluids but you should avoid alcoholic beverages for 24 hours.  ACTIVITY:  You should  plan to take it easy for the rest of today and you should NOT DRIVE or use heavy machinery until tomorrow (because of the sedation medicines used during the test).    FOLLOW UP: Our staff will call the number listed on your records the next business day following your procedure.  We will call around 7:15- 8:00 am to check on you and address any questions or concerns that you may have regarding the information given to you following your procedure. If we do not reach you, we will leave a message.     If any biopsies were taken you will be contacted by phone or by letter within the next 1-3 weeks.  Please call us at [phone] if you have not heard about the biopsies in 3 weeks.    SIGNATURES/CONFIDENTIALITY: You and/or your care partner have signed paperwork which will be entered into your electronic medical record.  These signatures attest to the fact that that the information above on your After Visit Summary has been reviewed and is understood.  Full responsibility of the confidentiality of this discharge information lies with you and/or your care-partner. 

## 2022-06-09 NOTE — Progress Notes (Signed)
Sedate, gd SR, tolerated procedure well, VSS, report to RN 

## 2022-06-09 NOTE — Op Note (Signed)
[place] [facility] Patient Name: Jeffrey Klein Procedure Date: 06/09/2022 4:12 PM MRN: [id number] Endoscopist: Gerrit Heck , MD, [id number] Age: 61 Referring MD:  Date of Birth: 06/20/1961 Gender: Male Account #: [id number] Procedure:                Colonoscopy Indications:              Surveillance: Personal history of adenomatous                            polyps on last colonoscopy 5 years ago                           Last colonoscopy was 08/29/2016 at [facility] and notable for a                            single 5 mm transverse colon adenoma, sigmoid                            diverticulosis. Repeat 5 years recommended.                            Otherwise, no active GI symptoms. Medicines:                Monitored Anesthesia Care Procedure:                Pre-Anesthesia Assessment:                           - Prior to the procedure, a History and Physical                            was performed, and patient medications and                            allergies were reviewed. The patient's tolerance of                            previous anesthesia was also reviewed. The risks                            and benefits of the procedure and the sedation                            options and risks were discussed with the patient.                            All questions were answered, and informed consent                            was obtained. Prior Anticoagulants: The patient has  taken no anticoagulant or antiplatelet agents. ASA                            Grade Assessment: II - A patient with mild systemic                            disease. After reviewing the risks and benefits,                            the patient was deemed in satisfactory condition to                            undergo the procedure.                           After obtaining informed consent, the colonoscope                             was passed under direct vision. Throughout the                            procedure, the patient's blood pressure, pulse, and                            oxygen saturations were monitored continuously. The                            CF [id number] #[id number] was introduced through the anus                            and advanced to the the cecum, identified by                            appendiceal orifice and ileocecal valve. The                            colonoscopy was performed without difficulty. The                            patient tolerated the procedure well. The quality                            of the bowel preparation was good. The ileocecal                            valve, appendiceal orifice, and rectum were                            photographed. Scope In: 4:18:21 PM Scope Out: 4:32:42 PM Scope Withdrawal Time: 0 hours 9 minutes 23 seconds  Total Procedure Duration: 0 hours 14 minutes 21 seconds  Findings:                 The perianal and digital rectal examinations were  normal.                           A 5 mm polyp was found in the sigmoid colon. The                            polyp was sessile. The polyp was removed with a                            cold snare. Resection and retrieval were complete.                            Estimated blood loss was minimal.                           Multiple large-mouthed and small-mouthed                            diverticula were found in the sigmoid colon.                           Non-bleeding internal hemorrhoids were found during                            retroflexion. The hemorrhoids were small and Grade                            I (internal hemorrhoids that do not prolapse). Complications:            No immediate complications. Estimated Blood Loss:     Estimated blood loss was minimal. Impression:               - One 5 mm polyp in the sigmoid colon, removed with                             a cold snare. Resected and retrieved.                           - Diverticulosis in the sigmoid colon.                           - Non-bleeding internal hemorrhoids. Recommendation:           - Patient has a contact number available for                            emergencies. The signs and symptoms of potential                            delayed complications were discussed with the                            patient. Return to normal activities tomorrow.                            Written  discharge instructions were provided to the                            patient.                           - Resume previous diet.                           - Continue present medications.                           - Await pathology results.                           - Repeat colonoscopy for surveillance based on                            pathology results.                           - Return to GI clinic PRN. Gerrit Heck, MD 06/09/2022 4:40:26 PM

## 2022-06-09 NOTE — Progress Notes (Signed)
Pt's states no medical or surgical changes since previsit or office visit. 

## 2022-06-09 NOTE — Progress Notes (Signed)
GASTROENTEROLOGY PROCEDURE H&P NOTE   Primary Care Physician: Vivi Barrack, MD    Reason for Procedure:  Colon Cancer screening  Plan:    Colonoscopy  Patient is appropriate for endoscopic procedure(s) in the ambulatory ([place]) setting.  The nature of the procedure, as well as the risks, benefits, and alternatives were carefully and thoroughly reviewed with the patient. Ample time for discussion and questions allowed. The patient understood, was satisfied, and agreed to proceed.     HPI: Jeffrey Klein is a 61 y.o. male who presents for colonoscopy for routine Colon Cancer screening and polyp surveillance.   - 08/29/2016: Colonoscopy [facility]): Single 5 mm transverse colon adenoma, sigmoid diverticulosis.  Repeat 5 years - 12/25/2016: Evaluated by Dr. Bennett Scrape at [facility] for painless hematochezia, presumed hemorrhoidal.  Was treated with fiber supplement and conservative management  Past Medical History:  Diagnosis Date   Diverticulosis    diverticulitis in 2018, no recurrence   GERD (gastroesophageal reflux disease)    High cholesterol    Psoriasis    Psoriatic arthritis ([place])     Past Surgical History:  Procedure Laterality Date   COLONOSCOPY     x3   dental surgeries     KNEE ARTHROSCOPY Bilateral    LASIK Bilateral 2006   TONSILLECTOMY     as a child    WRIST SURGERY Right     Prior to Admission medications   Medication Sig Start Date End Date Taking? Authorizing Provider  allopurinol (ZYLOPRIM) 100 MG tablet Take 1 tablet (100 mg total) by mouth daily. 02/06/22  Yes Vivi Barrack, MD  betamethasone valerate (VALISONE) 0.1 % cream Apply topically once daily as needed 09/09/21  Yes Ofilia Neas, PA-C  Misc Natural Products (JOINT SUPPORT PO) Take 1 tablet by mouth daily.   Yes [provider]  Omega-3 Fatty Acids (FISH OIL PO) Take by mouth daily.   Yes [provider]  rosuvastatin  (CRESTOR) 10 MG tablet Take 1 tablet (10 mg total) by mouth at bedtime. 02/06/22  Yes Vivi Barrack, MD  Testosterone 12.5 MG/ACT (1%) GEL APPLY 4 PUMPS ('50MG'$ ) ONTO SKIN DAILY 03/17/22  Yes Vivi Barrack, MD  VITAMIN D PO Take by mouth daily.   Yes [provider]  TALTZ 80 MG/ML SOSY INJECT 80 MG (1 SYRINGE) UNDER THE SKIN EVERY 4 WEEKS 06/02/22   Bo Merino, MD    Current Outpatient Medications  Medication Sig Dispense Refill   allopurinol (ZYLOPRIM) 100 MG tablet Take 1 tablet (100 mg total) by mouth daily. 90 tablet 3   betamethasone valerate (VALISONE) 0.1 % cream Apply topically once daily as needed 45 g 0   Misc Natural Products (JOINT SUPPORT PO) Take 1 tablet by mouth daily.     Omega-3 Fatty Acids (FISH OIL PO) Take by mouth daily.     rosuvastatin (CRESTOR) 10 MG tablet Take 1 tablet (10 mg total) by mouth at bedtime. 90 tablet 1   Testosterone 12.5 MG/ACT (1%) GEL APPLY 4 PUMPS ('50MG'$ ) ONTO SKIN DAILY 150 g 0   VITAMIN D PO Take by mouth daily.     TALTZ 80 MG/ML SOSY INJECT 80 MG (1 SYRINGE) UNDER THE SKIN EVERY 4 WEEKS 3 mL 0   Current Facility-Administered Medications  Medication Dose Route Frequency Provider Last Rate Last Admin   0.9 %  sodium chloride infusion  500 mL Intravenous Once Christinea Brizuela, Arthur, DO  Allergies as of 06/09/2022   (No Known Allergies)    Family History  Problem Relation Age of Onset   Psoriasis Sister    Psoriasis Paternal 17    Healthy Daughter    ADD / ADHD Son    Drug abuse Son    Colon cancer Neg Hx    Esophageal cancer Neg Hx    Rectal cancer Neg Hx    Stomach cancer Neg Hx     Social History   Socioeconomic History   Marital status: Divorced    Spouse name: Not on file   Number of children: Not on file   Years of education: Not on file   Highest education level: Not on file  Occupational History   Not on file  Tobacco Use   Smoking status: Never    Passive exposure: Never   Smokeless  tobacco: Never  Vaping Use   Vaping Use: Never used  Substance and Sexual Activity   Alcohol use: Yes    Alcohol/week: 5.0 standard drinks of alcohol    Types: 1 Glasses of wine, 4 Cans of beer per week   Drug use: Never   Sexual activity: Not on file  Other Topics Concern   Not on file  Social History Narrative   Not on file   Social Determinants of Health   Financial Resource Strain: Not on file  Food Insecurity: Not on file  Transportation Needs: Not on file  Physical Activity: Not on file  Stress: Not on file  Social Connections: Not on file  Intimate Partner Violence: Not on file    Physical Exam: Vital signs in last 24 hours: '@BP'$  (!) 144/77   Pulse 78   Temp 98.4 F (36.9 C)   Ht '5\' 10"'$  (1.778 m)   Wt 226 lb 3.2 oz (102.6 kg)   SpO2 96%   BMI 32.46 kg/m  GEN: NAD EYE: Sclerae anicteric ENT: MMM CV: Non-tachycardic Pulm: CTA b/l GI: Soft, NT/ND NEURO:  Alert & Oriented x 3   Gerrit Heck, DO [facility]   06/09/2022 4:08 PM

## 2022-06-10 ENCOUNTER — Telehealth: Payer: Self-pay | Admitting: *Deleted

## 2022-06-10 NOTE — Telephone Encounter (Signed)
  Follow up Call-     06/09/2022    3:19 PM  Call back number  Post procedure Call Back phone  # [phone]  Permission to leave phone message Yes     Patient questions:  Do you have a fever, pain , or abdominal swelling? No. Pain Score  0 *  Have you tolerated food without any problems? Yes.    Have you been able to return to your normal activities? Yes.    Do you have any questions about your discharge instructions: Diet   No. Medications  No. Follow up visit  No.  Do you have questions or concerns about your Care? No.  Actions: * If pain score is 4 or above: No action needed, pain <4.

## 2022-06-13 ENCOUNTER — Other Ambulatory Visit: Payer: Self-pay | Admitting: Family Medicine

## 2022-06-13 ENCOUNTER — Encounter: Payer: Self-pay | Admitting: Rheumatology

## 2022-06-16 ENCOUNTER — Encounter: Payer: Self-pay | Admitting: Gastroenterology

## 2022-07-01 ENCOUNTER — Telehealth: Payer: Self-pay | Admitting: Pharmacist

## 2022-07-01 NOTE — Telephone Encounter (Addendum)
Received notification from [place] regarding a prior authorization for [place]. Authorization has been APPROVED from 07/01/22 to 07/01/23.  Patient can continue to fill through [place]: [phone]  Authorization # [id number]  Knox Saliva, PharmD, MPH, BCPS, CPP Clinical Pharmacist (Rheumatology and Pulmonology)

## 2022-07-01 NOTE — Telephone Encounter (Signed)
Submitted a Prior Authorization RENEWAL request to [place] for [place] via CoverMyMeds. Will update once we receive a response.  Key: [id number]  Knox Saliva, PharmD, MPH, BCPS, CPP Clinical Pharmacist (Rheumatology and Pulmonology)

## 2022-07-14 ENCOUNTER — Encounter: Payer: Self-pay | Admitting: Family Medicine

## 2022-07-14 ENCOUNTER — Encounter: Payer: Self-pay | Admitting: Rheumatology

## 2022-07-15 ENCOUNTER — Telehealth: Payer: Self-pay | Admitting: Pharmacist

## 2022-07-15 DIAGNOSIS — L409 Psoriasis, unspecified: Secondary | ICD-10-CM

## 2022-07-15 DIAGNOSIS — L4050 Arthropathic psoriasis, unspecified: Secondary | ICD-10-CM

## 2022-07-15 NOTE — Telephone Encounter (Signed)
Patient has new insurance and needs Donnetta Hail approved through new plan. Submitted a Prior Authorization request to [facility] for TALTZ PFS via CoverMyMeds. Will update once we receive a response.  Key: Kem Parkinson, PharmD, MPH, BCPS, CPP Clinical Pharmacist (Rheumatology and Pulmonology)

## 2022-07-18 NOTE — Telephone Encounter (Signed)
Received a fax regarding Prior Authorization from [facility] for [place]. Authorization has been DENIED because:  (1) One of the following: (A) You have a history of failure to a three month trial of methotrexate at maximally indicated dose, unless contraindicated or clinically significant adverse effects are experienced (document drug, date, and duration of trial). (B) You have been previously treated with a biologic or targeted synthetic disease modifying antirheumatic drug Food and Drug Administration-approved for the treatment of psoriatic arthritis as documented by claims history or submission of medical records (Document drug, date, and duration of therapy) [for example: Cimzia (certolizumab), adalimumab, Simponi (golimumab), Stelara (ustekinumab), Tremfya (guselkumab), Xeljanz/Xeljanz XR (tofacitinib), Otezla (apremilast), Skyrizi (risankizumab), Rinvoq (upadacitinib), Enbrel (etanercept)]. (2) You have a history of failure, contraindication, or intolerance to two of the following preferred biologic products* (document drug, date, and duration of trial): (A) Cimzia (certolizumab)*. (B) One of the preferred adalimumab products**. (C) Simponi (golimumab)*. (D) Stelara (ustekinumab)*. (E) Tremfya (guselkumab)*. (F) Skyrizi (risankizumab)*. (G) Enbrel (etanercept)*. (3) One of the following (document drug, date, and duration of trial): (A) You have a history of six month trial of Cosentyx (secukinumab)* with moderate clinical response yet residual disease activity. (B) Both of the following: (I) You have a history of intolerance or adverse event to Cosentyx. (II) Your physician attests that in his/her clinical opinion the same intolerance or adverse event would not be expected to occur with Taltz. (4) You have a history of failure, contraindication, or intolerance to one of the following (document drug, date, and duration of trial): (A) Orencia (abatacept)*. - NOT APPROVED FOR PSORAISIS (B)  Xeljanz/Xeljanz XR (tofacitinib)*. - has history of CAD (C) Rinvoq (upadacitinib)*. - has history of CAD

## 2022-07-18 NOTE — Telephone Encounter (Signed)
Submitted an URGENT appeal to [facility] for [place].  Reference # [id number] Fax: [phone] Phone: [phone]  Knox Saliva, PharmD, MPH, BCPS, CPP Clinical Pharmacist (Rheumatology and Pulmonology)

## 2022-07-29 ENCOUNTER — Other Ambulatory Visit (HOSPITAL_COMMUNITY): Payer: Self-pay

## 2022-07-29 MED ORDER — TALTZ 80 MG/ML ~~LOC~~ SOSY: PREFILLED_SYRINGE | SUBCUTANEOUS | 1 refills | Status: DC

## 2022-07-29 NOTE — Telephone Encounter (Signed)
Patient returned call. I advised him of Taltz approval. He will call [employer] to set up shipment. States he has used them before. I Davised him that I sent MyChart message with copay card and pharmacy information. He verbalized understanding  Knox Saliva, PharmD, MPH, BCPS, CPP Clinical Pharmacist (Rheumatology and Pulmonology)

## 2022-07-29 NOTE — Telephone Encounter (Signed)
Received notification from [facility] regarding a prior authorization for [place]. Authorization has been APPROVED from 07/20/2022 to 07/21/2023. Approval letter sent to scan center.  Unable to run test claim because patient must fill through [place]: [phone]   Authorization # [id number]  ATC patient to review. Unable to reach. Left VM and sent MyChart message to patient to advise  Knox Saliva, PharmD, MPH, BCPS, CPP Clinical Pharmacist (Rheumatology and Pulmonology)

## 2022-08-01 ENCOUNTER — Ambulatory Visit: Payer: 59 | Admitting: Family Medicine

## 2022-08-01 ENCOUNTER — Encounter: Payer: Self-pay | Admitting: Family Medicine

## 2022-08-01 VITALS — BP 133/85 | HR 82 | Temp 97.5°F | Ht 70.0 in | Wt 232.0 lb

## 2022-08-01 DIAGNOSIS — L4050 Arthropathic psoriasis, unspecified: Secondary | ICD-10-CM

## 2022-08-01 DIAGNOSIS — E785 Hyperlipidemia, unspecified: Secondary | ICD-10-CM

## 2022-08-01 DIAGNOSIS — R7989 Other specified abnormal findings of blood chemistry: Secondary | ICD-10-CM

## 2022-08-01 LAB — COMPREHENSIVE METABOLIC PANEL
ALT: 40 U/L (ref 0–53)
AST: 28 U/L (ref 0–37)
Albumin: 4.6 g/dL (ref 3.5–5.2)
Alkaline Phosphatase: 34 U/L — ABNORMAL LOW (ref 39–117)
BUN: 12 mg/dL (ref 6–23)
CO2: 28 mEq/L (ref 19–32)
Calcium: 9.7 mg/dL (ref 8.4–10.5)
Chloride: 105 mEq/L (ref 96–112)
Creatinine, Ser: 0.89 mg/dL (ref 0.40–1.50)
GFR: 91.42 mL/min (ref >60.00)
Glucose, Bld: 104 mg/dL — ABNORMAL HIGH (ref 70–99)
Potassium: 4.6 mEq/L (ref 3.5–5.1)
Sodium: 140 mEq/L (ref 135–145)
Total Bilirubin: 0.4 mg/dL (ref 0.2–1.2)
Total Protein: 7 g/dL (ref 6.0–8.3)

## 2022-08-01 LAB — LIPID PANEL
Cholesterol: 169 mg/dL (ref 0–200)
HDL: 52.4 mg/dL (ref >39.00)
LDL Cholesterol: 101 mg/dL — ABNORMAL HIGH (ref 0–99)
NonHDL: 116.89
Total CHOL/HDL Ratio: 3
Triglycerides: 80 mg/dL (ref 0.0–149.0)
VLDL: 16 mg/dL (ref 0.0–40.0)

## 2022-08-01 NOTE — Progress Notes (Signed)
   Jeffrey Klein is a 62 y.o. male who presents today for an office visit.  Assessment/Plan:  Chronic Problems Addressed Today: Dyslipidemia Doing well back on Crestor 10 mg daily.  No significant side effects.  Check labs today.  Low testosterone Doing well on testosterone replacement 4 pumps daily.  Will check testosterone when he comes back in for CPE.  Psoriatic arthritis ([place]) Follows with dermatology for this.  On Taltz and doing well.     Subjective:  HPI:  See A/p for status of chronic conditions.   Patient is here today for follow-up.  We last saw him about 5 months ago for dyslipidemia.  At that time he had not been taking his Crestor however was agreeable to restart at 10 mg daily.  He is back here today to have cholesterol levels rechecked.       Objective:  Physical Exam: BP 133/85   Pulse 82   Temp (!) 97.5 F (36.4 C)   Ht '5\' 10"'$  (1.778 m)   Wt 232 lb (105.2 kg)   SpO2 95%   BMI 33.29 kg/m   Gen: No acute distress, resting comfortably Neuro: Grossly normal, moves all extremities Psych: Normal affect and thought content      Alta Shober M. Jerline Pain, MD 08/01/2022 8:49 AM

## 2022-08-01 NOTE — Assessment & Plan Note (Signed)
Doing well back on Crestor 10 mg daily.  No significant side effects.  Check labs today.

## 2022-08-01 NOTE — Assessment & Plan Note (Signed)
Doing well on testosterone replacement 4 pumps daily.  Will check testosterone when he comes back in for CPE.

## 2022-08-01 NOTE — Assessment & Plan Note (Signed)
Follows with dermatology for this.  On Taltz and doing well.

## 2022-08-01 NOTE — Patient Instructions (Signed)
It was very nice to see you today!  We will check blood work.   We will see you back in 6 months for your annual physical.  Come back sooner if needed.   Take care, Dr Jerline Pain  PLEASE NOTE:  If you had any lab tests, please let us know if you have not heard back within a few days. You may see your results on mychart before we have a chance to review them but we will give you a call once they are reviewed by [place].   If we ordered any referrals today, please let us know if you have not heard from their office within the next week.   If you had any urgent prescriptions sent in today, please check with the pharmacy within an hour of our visit to make sure the prescription was transmitted appropriately.   Please try these tips to maintain a healthy lifestyle:  Eat at least 3 REAL meals and 1-2 snacks per day.  Aim for no more than 5 hours between eating.  If you eat breakfast, please do so within one hour of getting up.   Each meal should contain half fruits/vegetables, one quarter protein, and one quarter carbs (no bigger than a computer mouse)  Cut down on sweet beverages. This includes juice, soda, and sweet tea.   Drink at least 1 glass of water with each meal and aim for at least 8 glasses per day  Exercise at least 150 minutes every week.

## 2022-08-01 NOTE — Progress Notes (Signed)
Please inform patient of the following:  Cholesterol level much better than last time.  He can continue current dose and we can recheck in 6 months.

## 2022-08-04 ENCOUNTER — Other Ambulatory Visit: Payer: Self-pay | Admitting: *Deleted

## 2022-08-04 NOTE — Telephone Encounter (Signed)
(  Key: [facility]) - [id number] Testosterone 12.5 MG/ACT(1%) gel Status: PA Response - DeniedCreated: January 29th, 2024Sent: January 29th, 2024  LVM to patient with determination  Please contact your insurance for medication coverage

## 2022-09-08 ENCOUNTER — Encounter: Payer: Self-pay | Admitting: Family Medicine

## 2022-09-08 NOTE — Telephone Encounter (Signed)
See note

## 2022-09-08 NOTE — Telephone Encounter (Signed)
I wish we could help but I have no way of knowing the billing procedures at other offices. Recommend he reach out to GI or the billing department if he has not already done so.  Algis Greenhouse. Jerline Pain, MD 09/08/2022 1:09 PM

## 2022-09-15 ENCOUNTER — Other Ambulatory Visit: Payer: Self-pay | Admitting: Rheumatology

## 2022-09-15 ENCOUNTER — Encounter: Payer: Self-pay | Admitting: *Deleted

## 2022-09-15 DIAGNOSIS — L409 Psoriasis, unspecified: Secondary | ICD-10-CM

## 2022-09-15 DIAGNOSIS — L4050 Arthropathic psoriasis, unspecified: Secondary | ICD-10-CM

## 2022-09-15 NOTE — Telephone Encounter (Signed)
Next Visit: 11/03/2022  Last Visit: 06/02/2022  Last Fill: 07/29/2022  [id number] arthritis   Current Dose per office note 06/02/2022: Taltz 80 mg sq injections every 4 weeks   Labs: 08/01/2022 Glucose 104, Alk. Phos 34 06/02/2022 CBC WNL  TB Gold: 07/17/2021 Neg    Message sent to patient via my chart to advise patient he is due to update labs.   Okay to refill Taltz?

## 2022-09-18 ENCOUNTER — Other Ambulatory Visit: Payer: Self-pay | Admitting: Physician Assistant

## 2022-09-18 DIAGNOSIS — L409 Psoriasis, unspecified: Secondary | ICD-10-CM

## 2022-09-18 DIAGNOSIS — L4050 Arthropathic psoriasis, unspecified: Secondary | ICD-10-CM

## 2022-10-06 ENCOUNTER — Other Ambulatory Visit: Payer: Self-pay | Admitting: Physician Assistant

## 2022-10-06 DIAGNOSIS — L409 Psoriasis, unspecified: Secondary | ICD-10-CM

## 2022-10-06 DIAGNOSIS — L4050 Arthropathic psoriasis, unspecified: Secondary | ICD-10-CM

## 2022-10-16 IMAGING — MR MR HEAD WO/W CM
12 series · 48 of 48 positions shown · IV contrast (20ml Multihance)
Comparison: None.

CLINICAL DATA: 58-year-old male with persistent, recurrent
dizziness with nausea for 3 months. Possible vascular or cardiac
etiology.

EXAM:
MRI HEAD WITHOUT AND WITH CONTRAST
TECHNIQUE: Multiplanar, multiecho pulse sequences of the brain and surrounding
structures were obtained without and with intravenous contrast.
CONTRAST:  20mL MULTIHANCE GADOBENATE DIMEGLUMINE 529 MG/ML IV SOLN

[Series 2: T1 · sagittal · 5.0mm · 0.45mm/px · 2 of 22 slices shown]
[im 1/22]
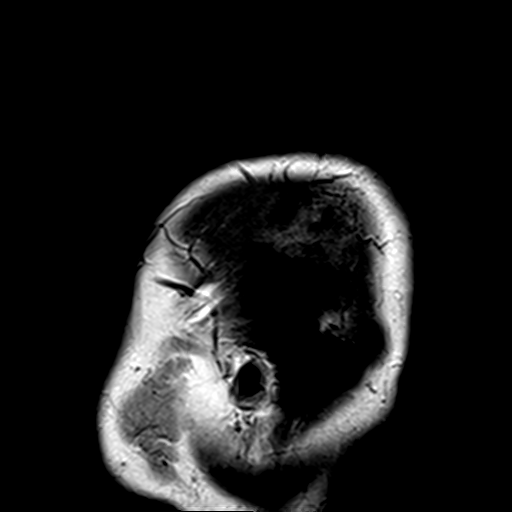
[im 22/22]
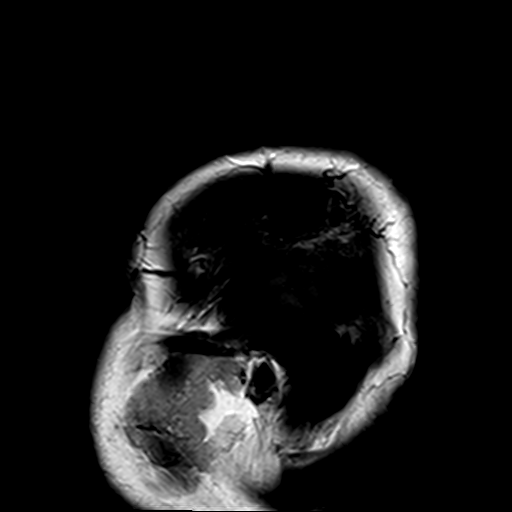

[Series 3: DWI · axial · 3.0mm · 1.80mm/px · z∈[-39,+106]mm · 7 of 100 slices shown (1 of 4)]
[im 1/100]
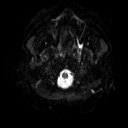
[im 17/100]
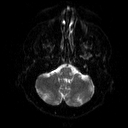
[im 34/100]
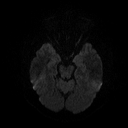
[im 50/100]
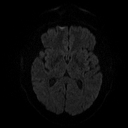
[im 67/100]
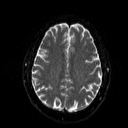
[im 83/100]
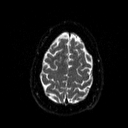
[im 100/100]
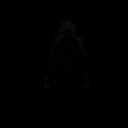

[Series 4: DWI · axial · 3.0mm · 1.80mm/px · z∈[-39,+106]mm · 3 of 50 slices shown (2 of 4)]
[im 1/50]
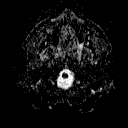
[im 25/50]
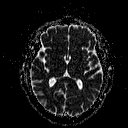
[im 50/50]
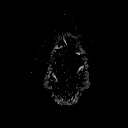

[Series 5: DWI · coronal · 5.0mm · 1.80mm/px · 5 of 70 slices shown (3 of 4)]
[im 1/70]
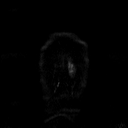
[im 18/70]
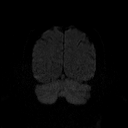
[im 35/70]
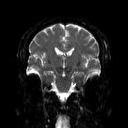
[im 52/70]
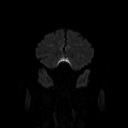
[im 70/70]
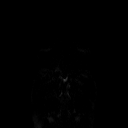

[Series 6: DWI · coronal · 5.0mm · 1.80mm/px · 2 of 35 slices shown (4 of 4)]
[im 1/35]
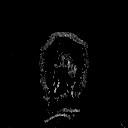
[im 35/35]
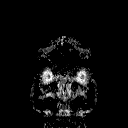

[Series 7: T2 · axial · 5.0mm · 0.60mm/px · 1 of 22 slices shown (1 of 2)]
[im 1/22]
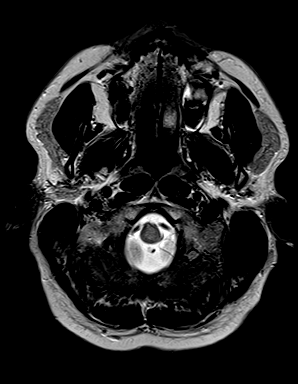

[Series 8: FLAIR · axial · 3.0mm · 0.45mm/px · z∈[-33,+100]mm · 2 of 30 slices shown]
[im 1/30]
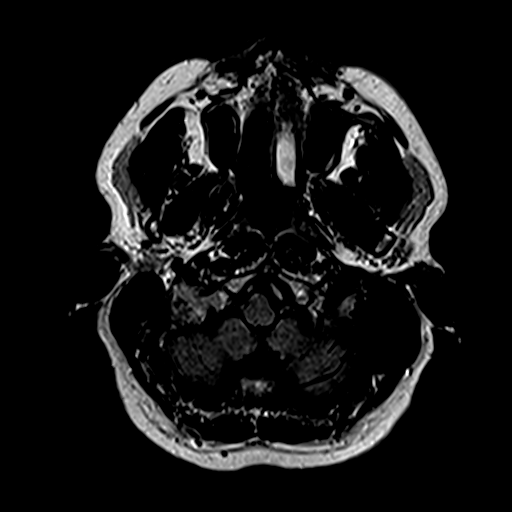
[im 30/30]
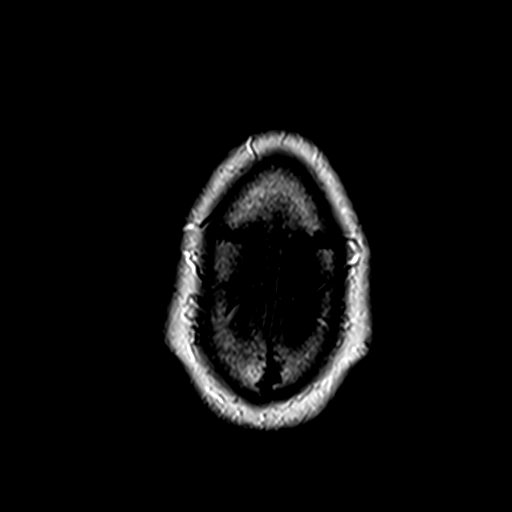

[Series 10: swi_images · axial · 4.0mm · 0.90mm/px · z∈[-36,+103]mm · 2 of 36 slices shown]
[im 1/36]
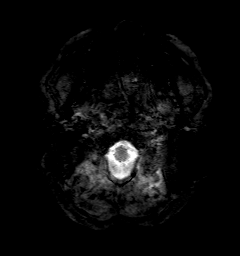
[im 36/36]
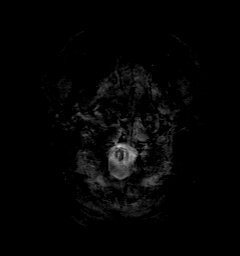

[Series 11: t1_mpr_tra · axial · 1.0mm · 0.75mm/px · z∈[-43,+99]mm · 10 of 144 slices shown (1 of 2)]
[im 1/144]
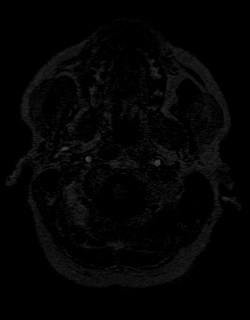
[im 16/144]
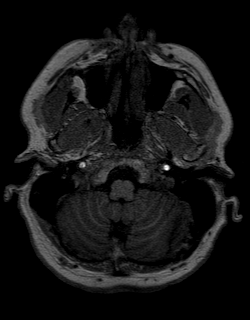
[im 32/144]
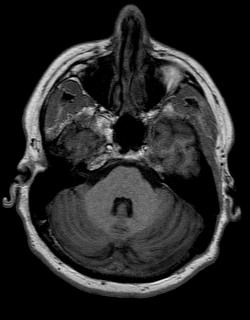
[im 48/144]
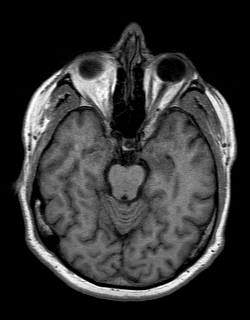
[im 64/144]
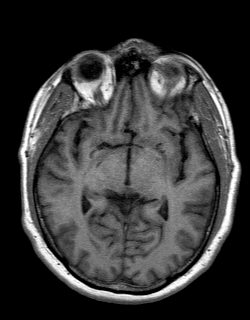
[im 80/144]
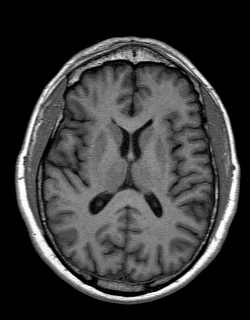
[im 96/144]
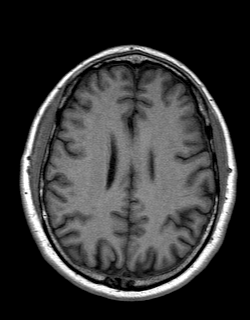
[im 112/144]
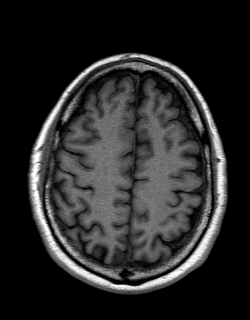
[im 128/144]
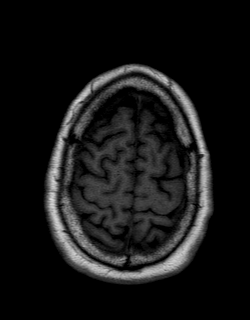
[im 144/144]
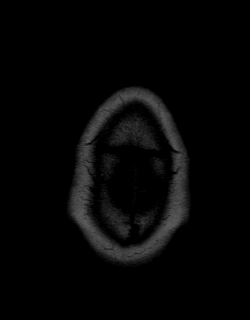

[Series 12: T2 · coronal · 5.0mm · 0.45mm/px · 2 of 28 slices shown (2 of 2)]
[im 1/28]
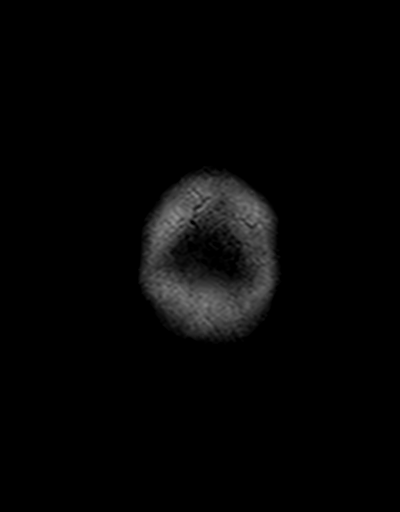
[im 28/28]
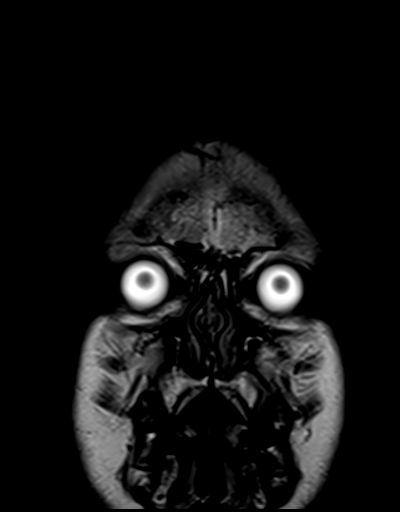

[Series 13: t1_mpr_tra · axial · 1.0mm · 0.75mm/px · z∈[-43,+99]mm · 10 of 144 slices shown (2 of 2)]
[im 1/144]
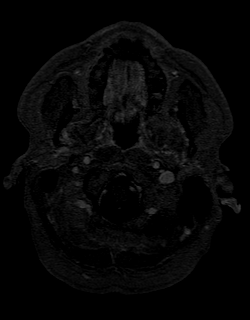
[im 16/144]
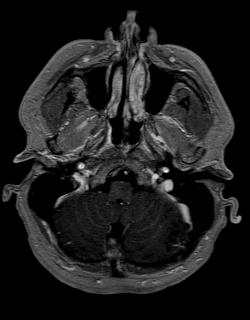
[im 32/144]
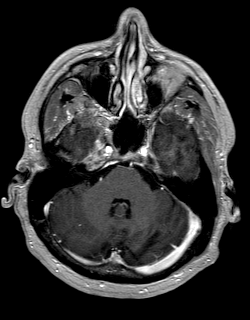
[im 48/144]
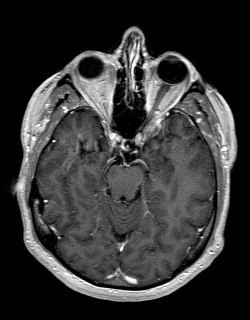
[im 64/144]
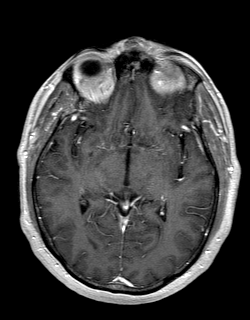
[im 80/144]
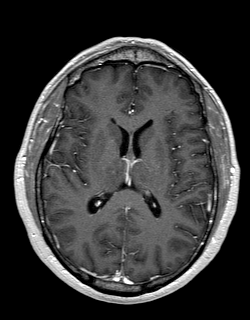
[im 96/144]
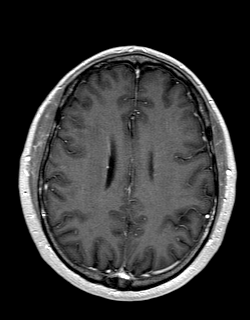
[im 112/144]
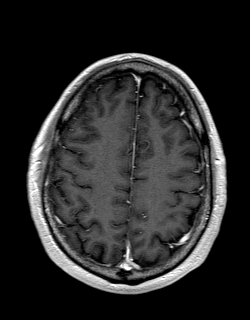
[im 128/144]
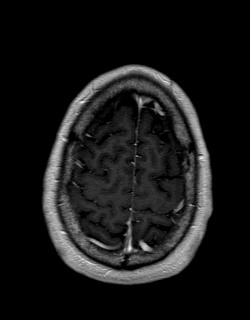
[im 144/144]
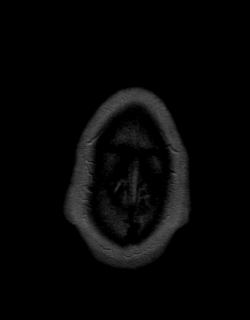

[Series 14: post cor · coronal · 5.0mm · 0.45mm/px · 2 of 28 slices shown]
[im 1/28]
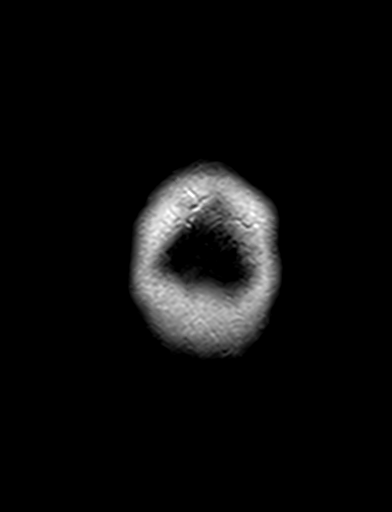
[im 28/28]
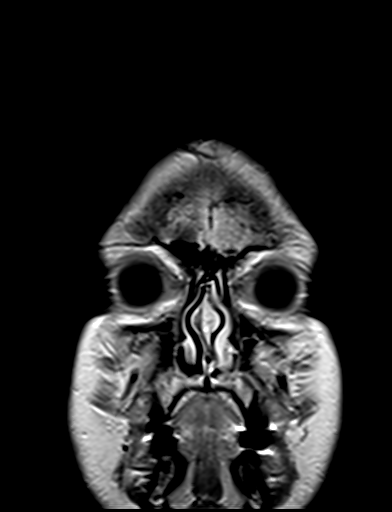

[48 of 48 positions shown; findings below may reference images not displayed]

FINDINGS: Brain: Cerebral volume is within normal limits for age. No
restricted diffusion to suggest acute infarction. No midline shift,
mass effect, evidence of mass lesion, ventriculomegaly, extra-axial
collection or acute intracranial hemorrhage. Cervicomedullary
junction and pituitary are within normal limits.

Gray and white matter signal is within normal limits for age
throughout the brain. No chronic cerebral blood products identified.
No convincing encephalomalacia. Deep gray nuclei, brainstem and
cerebellum appear normal.

No abnormal enhancement identified.  No dural thickening.

Vascular: Major intracranial vascular flow voids are preserved. The
major dural venous sinuses are enhancing and appear to be patent.

Skull and upper cervical spine: Normal visible upper cervical spine,
bone marrow signal.

Sinuses/Orbits: Negative orbits. Trace paranasal sinus mucosal
thickening.

Other: Visible internal auditory structures appear normal. Mastoids
are well aerated. Normal stylomastoid foramina. Visible face and
scalp soft tissues appear negative.
IMPRESSION: Normal MRI appearance of the brain. No explanation for dizziness,
nausea.

## 2022-10-17 ENCOUNTER — Other Ambulatory Visit: Payer: Self-pay | Admitting: Family Medicine

## 2022-10-20 NOTE — Progress Notes (Unsigned)
Office Visit Note  Patient: Jeffrey Klein             Date of Birth: 1961-06-09           MRN: [id number]             PCP: Ardith Dark, MD Referring: Ardith Dark, MD Visit Date: 11/03/2022 Occupation: @GUAROCC @  Subjective:  Left knee joint pain   History of Present Illness: Jeffrey Klein is a 62 y.o. male with history of psoriatic arthritis, osteoarthritis, and gout.  Patient remains on Taltz 80 mg sq injections every 4 weeks-started 09/23/16.  He is tolerating taltz without any side effects.  He has not missed any doses.  He denies any recent flares.  He has no active psoriasis at this time.  He denies any SI joint pain since his last office visit.  He has had some increased discomfort in the left knee over the past several months and states that in the morning he has restricted range of motion with some stiffness.  He denies any mechanical symptoms in the left knee.  He denies any warmth or swelling.  He states that it does look like his left knee is larger than the right knee.  He denies any Achilles tendinitis or plantar fasciitis.   Activities of Daily Living:  Patient reports morning stiffness for 30 minutes.   Patient Reports nocturnal pain.  Difficulty dressing/grooming: Reports Difficulty climbing stairs: Denies Difficulty getting out of chair: Reports Difficulty using hands for taps, buttons, cutlery, and/or writing: Denies  Review of Systems  Constitutional:  Negative for fatigue.  HENT:  Positive for mouth dryness. Negative for mouth sores.   Eyes:  Positive for dryness.  Respiratory:  Negative for shortness of breath.   Cardiovascular:  Negative for chest pain and palpitations.  Gastrointestinal:  Negative for blood in stool, constipation and diarrhea.  Endocrine: Negative for increased urination.  Genitourinary:  Negative for involuntary urination.  Musculoskeletal:  Positive for joint pain, joint pain, joint swelling and morning stiffness. Negative for gait  problem, myalgias, muscle weakness, muscle tenderness and myalgias.  Skin:  Negative for color change, rash, hair loss and sensitivity to sunlight.  Allergic/Immunologic: Negative for susceptible to infections.  Neurological:  Negative for dizziness and headaches.  Hematological:  Negative for swollen glands.  Psychiatric/Behavioral:  Negative for depressed mood and sleep disturbance. The patient is not nervous/anxious.     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   Gout 11/18/2019   Dyslipidemia 11/18/2019   Low testosterone 11/18/2019   Diverticulosis 11/18/2019   Psoriatic arthritis (HCC) 11/18/2019    Past Medical History:  Diagnosis Date   Diverticulosis    diverticulitis in 2018, no recurrence   GERD (gastroesophageal reflux disease)    High cholesterol    Psoriasis    Psoriatic arthritis (HCC)     Family History  Problem Relation Age of Onset   Psoriasis Sister    Psoriasis Paternal Grandmother    Healthy Daughter    ADD / ADHD Son    Drug abuse Son    Colon cancer Neg Hx  Esophageal cancer Neg Hx    Rectal cancer Neg Hx    Stomach cancer Neg Hx    Past Surgical History:  Procedure Laterality Date   COLONOSCOPY  08/29/2016   [facility]. Dr. Louellen Molder: Single 5 mm transverse colon adenoma, sigmoid diverticulosis.  Repeat 5 years   dental surgeries     KNEE ARTHROSCOPY Bilateral    LASIK Bilateral 2006   TONSILLECTOMY     as a child    TOOTH EXTRACTION     WRIST SURGERY Right    Social History   Social History Narrative   Not on file    Immunization History  Administered Date(s) Administered   Influenza,inj,Quad PF,6-35 Mos 05/19/2022   Influenza-Unspecified 02/21/2021   PFIZER(Purple Top)SARS-COV-2 Vaccination 10/06/2019, 10/31/2019, 03/05/2020   Pneumococcal-Unspecified 08/19/2018   Tdap 11/19/2020   Zoster Recombinat (Shingrix) 04/05/2019, 06/17/2019     Objective: Vital Signs: BP 127/76 (BP Location: Left Arm, Patient Position: Sitting, Cuff Size: Normal)   Pulse 75   Resp 16   Ht 5\' 10"  (1.778 m)   Wt 224 lb (101.6 kg)   BMI 32.14 kg/m    Physical Exam Vitals and nursing note reviewed.  Constitutional:      Appearance: He is well-developed.  HENT:     Head: Normocephalic and atraumatic.  Eyes:     Conjunctiva/sclera: Conjunctivae normal.     Pupils: Pupils are equal, round, and reactive to light.  Cardiovascular:     Rate and Rhythm: Normal rate and regular rhythm.     Heart sounds: Normal heart sounds.  Pulmonary:     Effort: Pulmonary effort is normal.     Breath sounds: Normal breath sounds.  Abdominal:     General: Bowel sounds are normal.     Palpations: Abdomen is soft.  Musculoskeletal:     Cervical back: Normal range of motion and neck supple.  Skin:    General: Skin is warm and dry.     Capillary Refill: Capillary refill takes less than 2 seconds.  Neurological:     Mental Status: He is alert and oriented to person, place, and time.  Psychiatric:        Behavior: Behavior normal.      Musculoskeletal Exam: C-spine, thoracic spine, and lumbar spine good ROM. No midline spinal tenderness. No SI joint tenderness. Shoulder joints, elbow joints, wrist joints, MCPs, PIPs, and DIPs good ROM with no synovitis.  Complete fist formation bilaterally.  PIP and DIP prominence.  Hip joints have good ROM with no groin pain.  Knee joints have good ROM with no warmth or effusion.  Ankle joints have good ROM with no tenderness or swelling.  No evidence of achilles tendonitis.    CDAI Exam: CDAI  Score: -- Patient Global: --; Provider Global: -- Swollen: --; Tender: -- Joint Exam 11/03/2022   No joint exam has been documented for this visit   There is currently no information documented on the homunculus. Go to the Rheumatology activity and complete the homunculus joint exam.  Investigation: No additional findings.  Imaging: No results found.  Recent Labs: Lab Results  Component Value Date   WBC 7.2 06/02/2022   HGB 15.7 06/02/2022   PLT 294 06/02/2022   NA 140 08/01/2022   K 4.6 08/01/2022   CL 105 08/01/2022   CO2 28 08/01/2022   GLUCOSE 104 (H) 08/01/2022   BUN 12 08/01/2022   CREATININE 0.89 08/01/2022   BILITOT 0.4 08/01/2022   ALKPHOS 34 (L)  08/01/2022   AST 28 08/01/2022   ALT 40 08/01/2022   PROT 7.0 08/01/2022   ALBUMIN 4.6 08/01/2022   CALCIUM 9.7 08/01/2022   GFRAA 107 08/16/2020   QFTBGOLDPLUS NEGATIVE 07/17/2021    Speciality Comments: Inadequate response to Enbrel, Stelara, Cosentyx. He has been on Taltz for 6 years.  Procedures:  No procedures performed Allergies: Patient has no known allergies.     Assessment / Plan:     Visit Diagnoses: Psoriatic arthritis (HCC) -He has no synovitis or dactylitis on examination today.  He has not had any signs or symptoms of a psoriatic arthritis flare.  No active psoriasis at this time.  No SI joint tenderness upon palpation.  No evidence of Achilles tendinitis or plantar fasciitis.  He remains on Taltz 80 mg subcutaneous injections every 4 weeks.  He is tolerating Taltz without any side effects and has not missed any doses recently.  He will remain on Toltz as monotherapy.  He was advised to notify us if he develops signs or symptoms of a flare.  A refill was also sent to the pharmacy today.  He will follow-up in the office in 5 months or sooner if needed.  Plan: Ixekizumab (TALTZ) 80 MG/ML SOSY  Psoriasis - Diagnosed in 1985.  No active psoriasis at this time.- Plan: Ixekizumab (TALTZ) 80 MG/ML  SOSY  High risk medication use - Taltz 80 mg sq injections every 4 weeks-started 09/23/16-initially prescribed by rheum in [place].Inadequate response to Enbrel, Stelara, and Cosentyx. TB gold negative on 07/17/21.  Order for TB gold released today. CMP 08/01/2022.  CBC updated on 06/02/2022.  Orders for CBC and CMP released today. Discussed the importance of holding Taltz if he develops signs or symptoms of an infection and to resume once the infection has completely cleared.  - Plan: CBC with Differential/Platelet, COMPLETE METABOLIC PANEL WITH GFR, QuantiFERON-TB Gold Plus  Screening for tuberculosis - Order for TB gold released today. Plan: QuantiFERON-TB Gold Plus  Primary osteoarthritis of both hands: PIP and DIP prominence consistent with osteoarthritis and cirrhotic arthritis overlap.  No synovitis or dactylitis noted.  Complete fist formation bilaterally. Chronic SI joint pain: No SI joint tenderness upon palpation.  No nocturnal pain.  Primary osteoarthritis of both hips: He has good range of motion of both hip joints on examination today.  No groin pain currently.  Primary osteoarthritis of both knees: He has been experiencing increased discomfort in the left knee joint over the past several months.  He has not been experiencing any mechanical symptoms.  On examination he has good range of motion of both knee joints with no warmth or effusion.  X-rays of both knees from November 2021 were reviewed with the patient today in the office: Left knee revealed severe osteoarthritis and severe chondromalacia patella and the right knee reveals mild osteoarthritis and moderate chondromalacia patella.  Discussed the option of Visco gel injections versus cortisone injections but he would like to hold off at this time.  He has had cortisone injections in the past which eventually stopped working. He is not ready to proceed with a left knee replacement at this time.  Primary osteoarthritis of both  feet: He is not experiencing any discomfort in his feet at this time.  He is wearing proper fitting shoes.  He has good range of motion of both ankle joints with no tenderness or joint swelling.  No evidence of Achilles tendinitis.  Idiopathic chronic gout without tophus, unspecified site -He has not had any  signs or symptoms of a gout flare.  He has clinically been doing well taking allopurinol 100 mg daily prescribed by Dr. Jimmey Ralph. uric acid: 6.2 on 06/02/2022.  Uric acid level be rechecked today.- Plan: Uric acid  Other medical conditions are listed as follows:  Family history of psoriasis in sister  Hyperlipidemia, mixed  History of diverticulosis  Selective IgM deficiency (HCC)    Orders: Orders Placed This Encounter  Procedures   CBC with Differential/Platelet   COMPLETE METABOLIC PANEL WITH GFR   QuantiFERON-TB Gold Plus   Uric acid   Meds ordered this encounter  Medications   Ixekizumab (TALTZ) 80 MG/ML SOSY    Sig: INJECT 80MG  SUBCUTANEOUSLY EVERY 4 WEEKS    Dispense:  3 mL    Refill:  0     Follow-Up Instructions: Return in about 5 months (around 04/05/2023) for Psoriatic arthritis.   Gearldine Bienenstock, PA-C  Note - This record has been created using Dragon software.  Chart creation errors have been sought, but may not always  have been located. Such creation errors do not reflect on  the standard of medical care.

## 2022-10-21 ENCOUNTER — Encounter: Payer: Self-pay | Admitting: Family Medicine

## 2022-10-22 ENCOUNTER — Other Ambulatory Visit: Payer: Self-pay

## 2022-10-22 ENCOUNTER — Telehealth: Payer: Self-pay

## 2022-10-22 MED ORDER — ATORVASTATIN CALCIUM 20 MG PO TABS: 20.0000 mg | ORAL_TABLET | Freq: Every day | ORAL | 3 refills | Status: DC

## 2022-10-22 NOTE — Telephone Encounter (Signed)
Okay to switch to atorvastatin 20 mg daily.  This is an equivalent dose to his current prescription of rosuvastatin 10 mg daily.  He can send [place] a message in a few weeks if he has any issues with the change. Katina Degree. Jimmey Ralph, MD 10/22/2022 10:42 AM

## 2022-10-22 NOTE — Telephone Encounter (Signed)
Please complete PA for TESTOSTERONE 12.5 mg/act (1%) GEL

## 2022-10-31 ENCOUNTER — Telehealth: Payer: Self-pay

## 2022-10-31 NOTE — Telephone Encounter (Signed)
Patient contacted the office to confirm an appointment. Confirmed the patient has an appointment on 11/03/2022 at 8:00 am. Patient verbalized understanding.

## 2022-11-03 ENCOUNTER — Encounter: Payer: Self-pay | Admitting: Physician Assistant

## 2022-11-03 ENCOUNTER — Ambulatory Visit: Payer: 59 | Attending: Physician Assistant | Admitting: Physician Assistant

## 2022-11-03 VITALS — BP 127/76 | HR 75 | Resp 16 | Ht 70.0 in | Wt 224.0 lb

## 2022-11-03 DIAGNOSIS — Z840 Family history of diseases of the skin and subcutaneous tissue: Secondary | ICD-10-CM

## 2022-11-03 DIAGNOSIS — L4050 Arthropathic psoriasis, unspecified: Secondary | ICD-10-CM | POA: Diagnosis not present

## 2022-11-03 DIAGNOSIS — M19041 Primary osteoarthritis, right hand: Secondary | ICD-10-CM | POA: Diagnosis not present

## 2022-11-03 DIAGNOSIS — M160 Bilateral primary osteoarthritis of hip: Secondary | ICD-10-CM

## 2022-11-03 DIAGNOSIS — Z79899 Other long term (current) drug therapy: Secondary | ICD-10-CM | POA: Diagnosis not present

## 2022-11-03 DIAGNOSIS — M170 Bilateral primary osteoarthritis of knee: Secondary | ICD-10-CM

## 2022-11-03 DIAGNOSIS — M19042 Primary osteoarthritis, left hand: Secondary | ICD-10-CM

## 2022-11-03 DIAGNOSIS — M1A00X0 Idiopathic chronic gout, unspecified site, without tophus (tophi): Secondary | ICD-10-CM

## 2022-11-03 DIAGNOSIS — D804 Selective deficiency of immunoglobulin M [IgM]: Secondary | ICD-10-CM

## 2022-11-03 DIAGNOSIS — Z111 Encounter for screening for respiratory tuberculosis: Secondary | ICD-10-CM

## 2022-11-03 DIAGNOSIS — M533 Sacrococcygeal disorders, not elsewhere classified: Secondary | ICD-10-CM

## 2022-11-03 DIAGNOSIS — E782 Mixed hyperlipidemia: Secondary | ICD-10-CM

## 2022-11-03 DIAGNOSIS — M19071 Primary osteoarthritis, right ankle and foot: Secondary | ICD-10-CM

## 2022-11-03 DIAGNOSIS — G8929 Other chronic pain: Secondary | ICD-10-CM

## 2022-11-03 DIAGNOSIS — M19072 Primary osteoarthritis, left ankle and foot: Secondary | ICD-10-CM

## 2022-11-03 DIAGNOSIS — L409 Psoriasis, unspecified: Secondary | ICD-10-CM

## 2022-11-03 DIAGNOSIS — Z8719 Personal history of other diseases of the digestive system: Secondary | ICD-10-CM

## 2022-11-03 LAB — CBC WITH DIFFERENTIAL/PLATELET
Basophils Absolute: 32 cells/uL (ref 0–200)
Eosinophils Absolute: 149 cells/uL (ref 15–500)

## 2022-11-03 MED ORDER — TALTZ 80 MG/ML ~~LOC~~ SOSY
PREFILLED_SYRINGE | SUBCUTANEOUS | 0 refills | Status: DC
Start: 2022-11-03 — End: 2023-01-12

## 2022-11-03 NOTE — Progress Notes (Signed)
CBC WNL

## 2022-11-03 NOTE — Patient Instructions (Signed)
Standing Labs We placed an order today for your standing lab work.   Please have your standing labs drawn at the end of July and every 3 months   Please have your labs drawn 2 weeks prior to your appointment so that the provider can discuss your lab results at your appointment, if possible.  Please note that you may see your imaging and lab results in MyChart before we have reviewed them. We will contact you once all results are reviewed. Please allow our office up to 72 hours to thoroughly review all of the results before contacting the office for clarification of your results.  WALK-IN LAB HOURS  Monday through Thursday from 8:00 am -12:30 pm and 1:00 pm-5:00 pm and Friday from 8:00 am-12:00 pm.  Patients with office visits requiring labs will be seen before walk-in labs.  You may encounter longer than normal wait times. Please allow additional time. Wait times may be shorter on  Monday and Thursday afternoons.  We do not book appointments for walk-in labs. We appreciate your patience and understanding with our staff.   Labs are drawn by [facility]. Please bring your co-pay at the time of your lab draw.  You may receive a bill from [facility] for your lab work.  Please note if you are on Hydroxychloroquine and and an order has been placed for a Hydroxychloroquine level,  you will need to have it drawn 4 hours or more after your last dose.  If you wish to have your labs drawn at another location, please call the office 24 hours in advance so we can fax the orders.  The office is located at [address], [place], [place] [address]   If you have any questions regarding directions or hours of operation,  please call [phone].   As a reminder, please drink plenty of water prior to coming for your lab work. Thanks!

## 2022-11-04 ENCOUNTER — Telehealth: Payer: Self-pay

## 2022-11-04 ENCOUNTER — Other Ambulatory Visit (HOSPITAL_COMMUNITY): Payer: Self-pay

## 2022-11-04 LAB — COMPLETE METABOLIC PANEL WITH GFR
ALT: 36 U/L (ref 9–46)
AST: 31 U/L (ref 10–35)
CO2: 30 mmol/L (ref 20–32)
Calcium: 9.8 mg/dL (ref 8.6–10.3)
Chloride: 105 mmol/L (ref 98–110)
Creat: 0.91 mg/dL (ref 0.70–1.35)
Sodium: 144 mmol/L (ref 135–146)

## 2022-11-04 LAB — CBC WITH DIFFERENTIAL/PLATELET
Basophils Relative: 0.7 %
Eosinophils Relative: 3.3 %
HCT: 48.4 % (ref 38.5–50.0)
Hemoglobin: 16.2 g/dL (ref 13.2–17.1)
MCHC: 33.5 g/dL (ref 32.0–36.0)
MCV: 87.5 fL (ref 80.0–100.0)

## 2022-11-04 NOTE — Progress Notes (Signed)
CMP WNL Uric acid is 7.2. He is taking allopurinol as prescribed.  He denies any recent gout flares.  Discussed at this appt yesterday ideally his uric acid should be less than 6 but he does not want to make any medication changes.  Recommend avoiding a purine rich diet and alcohol use.

## 2022-11-04 NOTE — Telephone Encounter (Signed)
Created new encounter for PA. Will route back to pool once determination is made. 

## 2022-11-04 NOTE — Telephone Encounter (Signed)
Pharmacy Patient Advocate Encounter   Received notification from [facility] that prior authorization for Testosterone 12.5 MG/ACT (1%) gel is required/requested.   PA submitted on 11/04/22 to (ins) [employer] via CoverMyMeds Key or (Medicaid) confirmation # [id number] Status is pending

## 2022-11-04 NOTE — Progress Notes (Signed)
We can continue to monitor.  He should notify us if he develops signs or symptoms of a gout flare.

## 2022-11-05 LAB — COMPLETE METABOLIC PANEL WITH GFR
AG Ratio: 2 (calc) (ref 1.0–2.5)
Albumin: 4.6 g/dL (ref 3.6–5.1)
Alkaline phosphatase (APISO): 38 U/L (ref 35–144)
BUN: 18 mg/dL (ref 7–25)
Globulin: 2.3 g/dL (calc) (ref 1.9–3.7)
Glucose, Bld: 99 mg/dL (ref 65–99)
Potassium: 4.8 mmol/L (ref 3.5–5.3)
Total Bilirubin: 0.5 mg/dL (ref 0.2–1.2)
Total Protein: 6.9 g/dL (ref 6.1–8.1)
eGFR: 95 mL/min/{1.73_m2} (ref >=60)

## 2022-11-05 LAB — CBC WITH DIFFERENTIAL/PLATELET
Absolute Monocytes: 509 cells/uL (ref 200–950)
Lymphs Abs: 1332 cells/uL (ref 850–3900)
MCH: 29.3 pg (ref 27.0–33.0)
MPV: 9.5 fL (ref 7.5–12.5)
Monocytes Relative: 11.3 %
Neutro Abs: 2480 cells/uL (ref 1500–7800)
Neutrophils Relative %: 55.1 %
Platelets: 315 10*3/uL (ref 140–400)
RBC: 5.53 10*6/uL (ref 4.20–5.80)
RDW: 13.1 % (ref 11.0–15.0)
Total Lymphocyte: 29.6 %
WBC: 4.5 10*3/uL (ref 3.8–10.8)

## 2022-11-05 LAB — QUANTIFERON-TB GOLD PLUS
Mitogen-NIL: 7.66 IU/mL
NIL: 0.03 IU/mL
QuantiFERON-TB Gold Plus: NEGATIVE
TB1-NIL: 0 IU/mL
TB2-NIL: 0.01 IU/mL

## 2022-11-05 LAB — URIC ACID: Uric Acid, Serum: 7.2 mg/dL (ref 4.0–8.0)

## 2022-11-05 NOTE — Telephone Encounter (Signed)
Pharmacy Patient Advocate Encounter  Received notification from [employer] that the request for prior authorization for Testosterone 12.5 MG/ACT(1%) gel has been denied due to plan exclusion.        Please be advised we currently do not have a Pharmacist to review denials, therefore you will need to process appeals accordingly as needed. Thanks for your support at this time.   You may call [phone] or fax [phone], to appeal.  Denial letter attached to charts

## 2022-11-06 NOTE — Progress Notes (Signed)
TB gold negative

## 2022-11-12 NOTE — Telephone Encounter (Signed)
Patient notified, advise to call insurance for medication coverage

## 2022-11-14 ENCOUNTER — Encounter: Payer: Self-pay | Admitting: Family Medicine

## 2022-11-14 ENCOUNTER — Other Ambulatory Visit: Payer: Self-pay | Admitting: *Deleted

## 2022-11-14 MED ORDER — TESTOSTERONE 12.5 MG/ACT (1%) TD GEL: TRANSDERMAL | 5 refills | Status: DC

## 2022-11-14 MED ORDER — ALLOPURINOL 100 MG PO TABS: 100.0000 mg | ORAL_TABLET | Freq: Every day | ORAL | 3 refills | Status: DC

## 2022-11-26 ENCOUNTER — Encounter: Payer: Self-pay | Admitting: Family Medicine

## 2022-11-27 MED ORDER — TESTOSTERONE 50 MG/5GM (1%) TD GEL: 5.0000 g | Freq: Every day | TRANSDERMAL | 5 refills | Status: DC

## 2022-11-27 NOTE — Telephone Encounter (Signed)
Please advise 

## 2023-01-11 ENCOUNTER — Other Ambulatory Visit: Payer: Self-pay | Admitting: Physician Assistant

## 2023-01-11 DIAGNOSIS — L4050 Arthropathic psoriasis, unspecified: Secondary | ICD-10-CM

## 2023-01-11 DIAGNOSIS — L409 Psoriasis, unspecified: Secondary | ICD-10-CM

## 2023-01-12 NOTE — Telephone Encounter (Signed)
Last Fill: 11/03/2022  Labs: 11/03/2022 CMP WNL Uric acid is 7.2.CBC WNL   TB Gold: 11/03/2022 negative    Next Visit: 04/17/2023  Last Visit: 11/03/2022  QI:[id number] arthritis   Current Dose per office note on 11/03/2022: Taltz 80 mg sq injections every 4 weeks-   Okay to refill Taltz?

## 2023-01-14 NOTE — Progress Notes (Unsigned)
    [facility] [address] [place] [address] Phone: [phone]   Assessment and Plan:     There are no diagnoses linked to this encounter.  ***   Pertinent previous records reviewed include ***   Follow Up: ***     Subjective:   I, Jeffrey Klein, am serving as a Neurosurgeon for Doctor Richardean Sale  Chief Complaint: right shoulder, elbow and knee pain   HPI:   01/15/2023 Patient is a 62 year old male complaining of right shoulder,elbow and knee pain. Patient states  Relevant Historical Information: ***  Additional pertinent review of systems negative.   Current Outpatient Medications:    allopurinol (ZYLOPRIM) 100 MG tablet, Take 1 tablet (100 mg total) by mouth daily., Disp: 90 tablet, Rfl: 3   amoxicillin (AMOXIL) 500 MG capsule, Take by mouth. (Patient not taking: Reported on 11/03/2022), Disp: , Rfl:    atorvastatin (LIPITOR) 20 MG tablet, Take 1 tablet (20 mg total) by mouth daily., Disp: 90 tablet, Rfl: 3   betamethasone valerate (VALISONE) 0.1 % cream, Apply topically once daily as needed, Disp: 45 g, Rfl: 0   Ixekizumab (TALTZ) 80 MG/ML SOSY, INJECT 1 SYRINGE SUBCUTANEOUSLY  EVERY 4 WEEKS, Disp: 3 mL, Rfl: 0   Misc Natural Products (JOINT SUPPORT PO), Take 1 tablet by mouth daily., Disp: , Rfl:    Omega-3 Fatty Acids (FISH OIL PO), Take by mouth daily., Disp: , Rfl:    testosterone (TESTIM) 50 MG/5GM (1%) GEL, Place 5 g onto the skin daily., Disp: 150 g, Rfl: 5   Testosterone 12.5 MG/ACT (1%) GEL, APPLY FOUR PUMPS INTO THE SKIN DAILY, Disp: 150 g, Rfl: 5   VITAMIN D PO, Take by mouth daily., Disp: , Rfl:    Objective:     There were no vitals filed for this visit.    There is no height or weight on file to calculate BMI.    Physical Exam:    ***   Electronically signed by:  Jeffrey Klein [facility] 7:08 AM 01/14/23

## 2023-01-15 ENCOUNTER — Ambulatory Visit (INDEPENDENT_AMBULATORY_CARE_PROVIDER_SITE_OTHER): Payer: 59 | Admitting: Sports Medicine

## 2023-01-15 ENCOUNTER — Ambulatory Visit (INDEPENDENT_AMBULATORY_CARE_PROVIDER_SITE_OTHER): Payer: 59

## 2023-01-15 VITALS — BP 122/82 | HR 71 | Ht 70.0 in | Wt 224.0 lb

## 2023-01-15 DIAGNOSIS — M25521 Pain in right elbow: Secondary | ICD-10-CM

## 2023-01-15 DIAGNOSIS — M25511 Pain in right shoulder: Secondary | ICD-10-CM

## 2023-01-15 DIAGNOSIS — M25561 Pain in right knee: Secondary | ICD-10-CM | POA: Diagnosis not present

## 2023-01-15 MED ORDER — MELOXICAM 15 MG PO TABS
15.0000 mg | ORAL_TABLET | Freq: Every day | ORAL | 0 refills | Status: DC
Start: 2023-01-15 — End: 2023-06-11

## 2023-01-15 NOTE — Patient Instructions (Addendum)
Shoulder HEP  - Start meloxicam 15 mg daily x2 weeks.  If still having pain after 2 weeks, complete 3rd-week of meloxicam. May use remaining meloxicam as needed once daily for pain control.  Do not to use additional NSAIDs while taking meloxicam.  May use Tylenol 500-1000 mg 2 to 3 times a day for breakthrough pain. 4 week follow up  

## 2023-02-02 ENCOUNTER — Encounter: Payer: Self-pay | Admitting: Family Medicine

## 2023-02-02 ENCOUNTER — Ambulatory Visit: Payer: 59 | Admitting: Family Medicine

## 2023-02-02 VITALS — BP 129/76 | HR 84 | Temp 97.5°F | Ht 70.0 in | Wt 226.2 lb

## 2023-02-02 DIAGNOSIS — R739 Hyperglycemia, unspecified: Secondary | ICD-10-CM | POA: Diagnosis not present

## 2023-02-02 DIAGNOSIS — Z0001 Encounter for general adult medical examination with abnormal findings: Secondary | ICD-10-CM | POA: Diagnosis not present

## 2023-02-02 DIAGNOSIS — M1000 Idiopathic gout, unspecified site: Secondary | ICD-10-CM | POA: Diagnosis not present

## 2023-02-02 DIAGNOSIS — I779 Disorder of arteries and arterioles, unspecified: Secondary | ICD-10-CM

## 2023-02-02 DIAGNOSIS — L4050 Arthropathic psoriasis, unspecified: Secondary | ICD-10-CM

## 2023-02-02 DIAGNOSIS — R7989 Other specified abnormal findings of blood chemistry: Secondary | ICD-10-CM

## 2023-02-02 DIAGNOSIS — E559 Vitamin D deficiency, unspecified: Secondary | ICD-10-CM

## 2023-02-02 DIAGNOSIS — D804 Selective deficiency of immunoglobulin M [IgM]: Secondary | ICD-10-CM

## 2023-02-02 DIAGNOSIS — E785 Hyperlipidemia, unspecified: Secondary | ICD-10-CM

## 2023-02-02 LAB — COMPREHENSIVE METABOLIC PANEL
ALT: 33 U/L (ref 0–53)
AST: 31 U/L (ref 0–37)
Albumin: 4.8 g/dL (ref 3.5–5.2)
Alkaline Phosphatase: 46 U/L (ref 39–117)
BUN: 18 mg/dL (ref 6–23)
CO2: 25 mEq/L (ref 19–32)
Calcium: 9.9 mg/dL (ref 8.4–10.5)
Chloride: 103 mEq/L (ref 96–112)
Creatinine, Ser: 1.04 mg/dL (ref 0.40–1.50)
GFR: 76.33 mL/min (ref >60.00)
Glucose, Bld: 103 mg/dL — ABNORMAL HIGH (ref 70–99)
Potassium: 4.3 mEq/L (ref 3.5–5.1)
Sodium: 139 mEq/L (ref 135–145)
Total Bilirubin: 0.9 mg/dL (ref 0.2–1.2)
Total Protein: 7.3 g/dL (ref 6.0–8.3)

## 2023-02-02 LAB — VITAMIN D 25 HYDROXY (VIT D DEFICIENCY, FRACTURES): VITD: 48.66 ng/mL (ref 30.00–100.00)

## 2023-02-02 LAB — LIPID PANEL
Cholesterol: 158 mg/dL (ref 0–200)
HDL: 49.5 mg/dL (ref >39.00)
LDL Cholesterol: 90 mg/dL (ref 0–99)
NonHDL: 108.9
Total CHOL/HDL Ratio: 3
Triglycerides: 95 mg/dL (ref 0.0–149.0)
VLDL: 19 mg/dL (ref 0.0–40.0)

## 2023-02-02 LAB — CBC
HCT: 48.1 % (ref 39.0–52.0)
Hemoglobin: 15.8 g/dL (ref 13.0–17.0)
MCHC: 32.8 g/dL (ref 30.0–36.0)
MCV: 89.6 fl (ref 78.0–100.0)
Platelets: 327 10*3/uL (ref 150.0–400.0)
RBC: 5.37 Mil/uL (ref 4.22–5.81)
RDW: 14.4 % (ref 11.5–15.5)
WBC: 5.5 10*3/uL (ref 4.0–10.5)

## 2023-02-02 LAB — TESTOSTERONE: Testosterone: 253.59 ng/dL — ABNORMAL LOW (ref 300.00–890.00)

## 2023-02-02 LAB — HEMOGLOBIN A1C: Hgb A1c MFr Bld: 6.2 % (ref 4.6–6.5)

## 2023-02-02 LAB — TSH: TSH: 1.37 u[IU]/mL (ref 0.35–5.50)

## 2023-02-02 LAB — VITAMIN B12: Vitamin B-12: 187 pg/mL — ABNORMAL LOW (ref 211–911)

## 2023-02-02 LAB — PSA: PSA: 1.32 ng/mL (ref 0.10–4.00)

## 2023-02-02 LAB — URIC ACID: Uric Acid, Serum: 6.9 mg/dL (ref 4.0–7.8)

## 2023-02-02 MED ORDER — ALLOPURINOL 100 MG PO TABS: 100.0000 mg | ORAL_TABLET | Freq: Every day | ORAL | 3 refills | Status: DC

## 2023-02-02 NOTE — Assessment & Plan Note (Signed)
Follows with rheumatology. On taltz and doing well.

## 2023-02-02 NOTE — Progress Notes (Signed)
Chief Complaint:  Jeffrey Klein is a 62 y.o. male who presents today for his annual comprehensive physical exam.    Assessment/Plan:  New/Acute Problems: Right Shoulder Pain Following with sports medicine.  Has follow-up appointment scheduled with them soon.  Chronic Problems Addressed Today: Dyslipidemia Doing well on crestor 10 mg daily. Check labs.   Low testosterone On testosterone replacement 4 pumps daily. Check labs today.   Psoriatic arthritis (HCC) Follows with rheumatology. On taltz and doing well.   Selective IgM deficiency (HCC) Check labs.   Vitamin D deficiency Check vitamin D.   Gout Stable. No recent flares. Will refill allopurinol. Check uric acid.   Preventative Healthcare: Check labs. UpToDate on colonoscopy. UpToDate on vaccines.   Patient Counseling(The following topics were reviewed and/or handout was given):  -Nutrition: Stressed importance of moderation in sodium/caffeine intake, saturated fat and cholesterol, caloric balance, sufficient intake of fresh fruits, vegetables, and fiber.  -Stressed the importance of regular exercise.   -Substance Abuse: Discussed cessation/primary prevention of tobacco, alcohol, or other drug use; driving or other dangerous activities under the influence; availability of treatment for abuse.   -Injury prevention: Discussed safety belts, safety helmets, smoke detector, smoking near bedding or upholstery.   -Sexuality: Discussed sexually transmitted diseases, partner selection, use of condoms, avoidance of unintended pregnancy and contraceptive alternatives.   -Dental health: Discussed importance of regular tooth brushing, flossing, and dental visits.  -Health maintenance and immunizations reviewed. Please refer to Health maintenance section.  Return to care in 1 year for next preventative visit.     Subjective:  HPI:  He has no acute complaints today. He did fall a couple of weeks ago and injured his right shoulder  and right knee. He has been working with sports medicine for this. Pain is improving.   Lifestyle Diet: Balanced. Plenty of fruits and vegetables.  Exercise:  Very active. Works around [employer].      08/01/2022    8:06 AM  Depression screen PHQ 2/9  Decreased Interest 0  Down, Depressed, Hopeless 0  PHQ - 2 Score 0   ROS: Per HPI, otherwise a complete review of systems was negative.   PMH:  The following were reviewed and entered/updated in epic: Past Medical History:  Diagnosis Date   Diverticulosis    diverticulitis in 2018, no recurrence   GERD (gastroesophageal reflux disease)    High cholesterol    Psoriasis    Psoriatic arthritis [facility])    Patient Active Problem List   Diagnosis Date Noted   Vitamin D deficiency 01/29/2022   Carotid artery disease (HCC) 08/01/2021   Primary osteoarthritis of both hands 06/18/2020   Chronic SI joint pain 06/18/2020   Primary osteoarthritis of both hips 06/18/2020   Primary osteoarthritis of both knees 06/18/2020   Primary osteoarthritis of both feet 06/18/2020   Selective IgM deficiency (HCC) 06/18/2020   Gout 11/18/2019   Dyslipidemia 11/18/2019   Low testosterone 11/18/2019   Diverticulosis 11/18/2019   Psoriatic arthritis (HCC) 11/18/2019   Past Surgical History:  Procedure Laterality Date   COLONOSCOPY  08/29/2016   [facility]. Dr. Louellen Molder: Single 5 mm transverse colon adenoma, sigmoid diverticulosis.  Repeat 5 years   dental surgeries     KNEE ARTHROSCOPY Bilateral    LASIK Bilateral 2006   TONSILLECTOMY     as a child    TOOTH EXTRACTION     WRIST SURGERY Right     Family History  Problem Relation Age of Onset  Psoriasis Sister    Psoriasis Paternal Grandmother    Healthy Daughter    ADD / ADHD Son    Drug abuse Son    Colon cancer Neg Hx    Esophageal cancer Neg Hx    Rectal cancer Neg Hx    Stomach cancer Neg Hx     Medications- reviewed and updated Current Outpatient  Medications  Medication Sig Dispense Refill   atorvastatin (LIPITOR) 20 MG tablet Take 1 tablet (20 mg total) by mouth daily. 90 tablet 3   betamethasone valerate (VALISONE) 0.1 % cream Apply topically once daily as needed 45 g 0   Ixekizumab (TALTZ) 80 MG/ML SOSY INJECT 1 SYRINGE SUBCUTANEOUSLY  EVERY 4 WEEKS 3 mL 0   meloxicam (MOBIC) 15 MG tablet Take 1 tablet (15 mg total) by mouth daily. 30 tablet 0   Misc Natural Products (JOINT SUPPORT PO) Take 1 tablet by mouth daily.     Omega-3 Fatty Acids (FISH OIL PO) Take by mouth daily.     VITAMIN D PO Take by mouth daily.     allopurinol (ZYLOPRIM) 100 MG tablet Take 1 tablet (100 mg total) by mouth daily. 90 tablet 3   testosterone (TESTIM) 50 MG/5GM (1%) GEL Place 5 g onto the skin daily. 150 g 5   Testosterone 12.5 MG/ACT (1%) GEL APPLY FOUR PUMPS INTO THE SKIN DAILY 150 g 5   No current facility-administered medications for this visit.    Allergies-reviewed and updated No Known Allergies  Social History   Socioeconomic History   Marital status: Divorced    Spouse name: Not on file   Number of children: Not on file   Years of education: Not on file   Highest education level: Not on file  Occupational History   Not on file  Tobacco Use   Smoking status: Never    Passive exposure: Never   Smokeless tobacco: Never  Vaping Use   Vaping status: Never Used  Substance and Sexual Activity   Alcohol use: Yes    Alcohol/week: 5.0 standard drinks of alcohol    Types: 1 Glasses of wine, 4 Cans of beer per week   Drug use: Never   Sexual activity: Not on file  Other Topics Concern   Not on file  Social History Narrative   Not on file   Social Determinants of Health   Financial Resource Strain: Not on file  Food Insecurity: Not on file  Transportation Needs: Not on file  Physical Activity: Not on file  Stress: Not on file  Social Connections: Not on file        Objective:  Physical Exam: BP 129/76   Pulse 84   Temp  (!) 97.5 F (36.4 C) (Temporal)   Ht 5\' 10"  (1.778 m)   Wt 226 lb 3.2 oz (102.6 kg)   SpO2 97%   BMI 32.46 kg/m   Body mass index is 32.46 kg/m. Wt Readings from Last 3 Encounters:  02/02/23 226 lb 3.2 oz (102.6 kg)  01/15/23 224 lb (101.6 kg)  11/03/22 224 lb (101.6 kg)   Gen: NAD, resting comfortably HEENT: TMs normal bilaterally. OP clear. No thyromegaly noted.  CV: RRR with no murmurs appreciated Pulm: NWOB, CTAB with no crackles, wheezes, or rhonchi GI: Normal bowel sounds present. Soft, Nontender, Nondistended. MSK: no edema, cyanosis, or clubbing noted Skin: warm, dry Neuro: CN2-12 grossly intact. Strength 5/5 in upper and lower extremities. Reflexes symmetric and intact bilaterally.  Psych: Normal affect and thought  content     Kassidee Narciso M. Jimmey Ralph, MD 02/02/2023 8:25 AM

## 2023-02-02 NOTE — Assessment & Plan Note (Signed)
Check labs 

## 2023-02-02 NOTE — Assessment & Plan Note (Signed)
On testosterone replacement 4 pumps daily. Check labs today.

## 2023-02-02 NOTE — Patient Instructions (Signed)
It was very nice to see you today!  We will check labs.   Please continue to work on your diet and exercise.  Return in about 6 months (around 08/05/2023).   Take care, Dr Jimmey Ralph  PLEASE NOTE:  If you had any lab tests, please let us know if you have not heard back within a few days. You may see your results on mychart before we have a chance to review them but we will give you a call once they are reviewed by [place].   If we ordered any referrals today, please let us know if you have not heard from their office within the next week.   If you had any urgent prescriptions sent in today, please check with the pharmacy within an hour of our visit to make sure the prescription was transmitted appropriately.   Please try these tips to maintain a healthy lifestyle:  Eat at least 3 REAL meals and 1-2 snacks per day.  Aim for no more than 5 hours between eating.  If you eat breakfast, please do so within one hour of getting up.   Each meal should contain half fruits/vegetables, one quarter protein, and one quarter carbs (no bigger than a computer mouse)  Cut down on sweet beverages. This includes juice, soda, and sweet tea.   Drink at least 1 glass of water with each meal and aim for at least 8 glasses per day  Exercise at least 150 minutes every week.    Preventive Care 4-34 Years Old, Male Preventive care refers to lifestyle choices and visits with your health care provider that can promote health and wellness. Preventive care visits are also called wellness exams. What can I expect for my preventive care visit? Counseling During your preventive care visit, your health care provider may ask about your: Medical history, including: Past medical problems. Family medical history. Current health, including: Emotional well-being. Home life and relationship well-being. Sexual activity. Lifestyle, including: Alcohol, nicotine or tobacco, and drug use. Access to firearms. Diet, exercise,  and sleep habits. Safety issues such as seatbelt and bike helmet use. Sunscreen use. Work and work Astronomer. Physical exam Your health care provider will check your: Height and weight. These may be used to calculate your BMI (body mass index). BMI is a measurement that tells if you are at a healthy weight. Waist circumference. This measures the distance around your waistline. This measurement also tells if you are at a healthy weight and may help predict your risk of certain diseases, such as type 2 diabetes and high blood pressure. Heart rate and blood pressure. Body temperature. Skin for abnormal spots. What immunizations do I need?  Vaccines are usually given at various ages, according to a schedule. Your health care provider will recommend vaccines for you based on your age, medical history, and lifestyle or other factors, such as travel or where you work. What tests do I need? Screening Your health care provider may recommend screening tests for certain conditions. This may include: Lipid and cholesterol levels. Diabetes screening. This is done by checking your blood sugar (glucose) after you have not eaten for a while (fasting). Hepatitis B test. Hepatitis C test. HIV (human immunodeficiency virus) test. STI (sexually transmitted infection) testing, if you are at risk. Lung cancer screening. Prostate cancer screening. Colorectal cancer screening. Talk with your health care provider about your test results, treatment options, and if necessary, the need for more tests. Follow these instructions at home: Eating and drinking  Eat  a diet that includes fresh fruits and vegetables, whole grains, lean protein, and low-fat dairy products. Take vitamin and mineral supplements as recommended by your health care provider. Do not drink alcohol if your health care provider tells you not to drink. If you drink alcohol: Limit how much you have to 0-2 drinks a day. Know how much alcohol is  in your drink. In the [place] one drink equals one 12 oz bottle of beer (355 mL), one 5 oz glass of wine (148 mL), or one 1 oz glass of hard liquor (44 mL). Lifestyle Brush your teeth every morning and night with fluoride toothpaste. Floss one time each day. Exercise for at least 30 minutes 5 or more days each week. Do not use any products that contain nicotine or tobacco. These products include cigarettes, chewing tobacco, and vaping devices, such as e-cigarettes. If you need help quitting, ask your health care provider. Do not use drugs. If you are sexually active, practice safe sex. Use a condom or other form of protection to prevent STIs. Take aspirin only as told by your health care provider. Make sure that you understand how much to take and what form to take. Work with your health care provider to find out whether it is safe and beneficial for you to take aspirin daily. Find healthy ways to manage stress, such as: Meditation, yoga, or listening to music. Journaling. Talking to a trusted person. Spending time with friends and family. Minimize exposure to UV radiation to reduce your risk of skin cancer. Safety Always wear your seat belt while driving or riding in a vehicle. Do not drive: If you have been drinking alcohol. Do not ride with someone who has been drinking. When you are tired or distracted. While texting. If you have been using any mind-altering substances or drugs. Wear a helmet and other protective equipment during sports activities. If you have firearms in your house, make sure you follow all gun safety procedures. What's next? Go to your health care provider once a year for an annual wellness visit. Ask your health care provider how often you should have your eyes and teeth checked. Stay up to date on all vaccines. This information is not intended to replace advice given to you by your health care provider. Make sure you discuss any questions you have with your health  care provider. Document Revised: 12/19/2020 Document Reviewed: 12/19/2020 Elsevier Patient Education  2024 [employer].

## 2023-02-02 NOTE — Assessment & Plan Note (Signed)
Stable. No recent flares. Will refill allopurinol. Check uric acid.

## 2023-02-02 NOTE — Assessment & Plan Note (Signed)
Doing well on crestor 10 mg daily. Check labs.

## 2023-02-02 NOTE — Assessment & Plan Note (Signed)
Check vitamin D. 

## 2023-02-03 NOTE — Progress Notes (Signed)
His B12 is a little bit low.  Recommend starting 1000 mcg daily.  We should recheck this in 3 to 6 months.  Testosterone is also little bit low however if he feels like he is doing well with current regimen we can continue with his current dose and recheck this again in 6 to 12 months.  His sugar is borderline elevated but stable compared to previous.  His cholesterol levels are at goal.  Everything else is stable.  Do not need to make any other changes to his treatment plan at this time.  He should continue to work on diet and exercise and we can recheck everything in a year or so.

## 2023-02-11 NOTE — Progress Notes (Unsigned)
    Aleen Sells D.[facility] [address] [place] [address] Phone: [phone]   Assessment and Plan:     There are no diagnoses linked to this encounter.  ***   Pertinent previous records reviewed include ***   Follow Up: ***     Subjective:   I,  , am serving as a Neurosurgeon for Doctor Richardean Sale   Chief Complaint: right shoulder, elbow and knee pain    HPI:    01/15/2023 Patient is a 62 year old male complaining of right shoulder,elbow and knee pain. Patient states that he had a fall July 4th he fell on to the road. He landed on his right side. He had some bruising and swelling. He has decreased ROM due to swelling. No numbness and tingling now, decreased grip strength, he states he feels he is fatigued. He has some scraps and cuts. He has hx of knee arthritis and he notes that he landed on his knee no antalgic gait. Aleve for the pain and he thinks it helps he doesn't endorse to much pain , just pain from the fall   02/12/2023 Patient states   Relevant Historical Information: Psoriatic arthritis, gout  Additional pertinent review of systems negative.   Current Outpatient Medications:    allopurinol (ZYLOPRIM) 100 MG tablet, Take 1 tablet (100 mg total) by mouth daily., Disp: 90 tablet, Rfl: 3   atorvastatin (LIPITOR) 20 MG tablet, Take 1 tablet (20 mg total) by mouth daily., Disp: 90 tablet, Rfl: 3   betamethasone valerate (VALISONE) 0.1 % cream, Apply topically once daily as needed, Disp: 45 g, Rfl: 0   Ixekizumab (TALTZ) 80 MG/ML SOSY, INJECT 1 SYRINGE SUBCUTANEOUSLY  EVERY 4 WEEKS, Disp: 3 mL, Rfl: 0   meloxicam (MOBIC) 15 MG tablet, Take 1 tablet (15 mg total) by mouth daily., Disp: 30 tablet, Rfl: 0   Misc Natural Products (JOINT SUPPORT PO), Take 1 tablet by mouth daily., Disp: , Rfl:    Omega-3 Fatty Acids (FISH OIL PO), Take by mouth daily., Disp: , Rfl:    testosterone (TESTIM) 50 MG/5GM (1%) GEL, Place 5 g  onto the skin daily., Disp: 150 g, Rfl: 5   Testosterone 12.5 MG/ACT (1%) GEL, APPLY FOUR PUMPS INTO THE SKIN DAILY, Disp: 150 g, Rfl: 5   VITAMIN D PO, Take by mouth daily., Disp: , Rfl:    Objective:     There were no vitals filed for this visit.    There is no height or weight on file to calculate BMI.    Physical Exam:    ***   Electronically signed by:  Aleen Sells D.[facility] 4:11 PM 02/11/23

## 2023-02-12 ENCOUNTER — Ambulatory Visit (INDEPENDENT_AMBULATORY_CARE_PROVIDER_SITE_OTHER): Payer: 59 | Admitting: Sports Medicine

## 2023-02-12 ENCOUNTER — Ambulatory Visit (INDEPENDENT_AMBULATORY_CARE_PROVIDER_SITE_OTHER): Payer: 59

## 2023-02-12 VITALS — BP 122/82 | HR 76 | Ht 70.0 in | Wt 226.0 lb

## 2023-02-12 DIAGNOSIS — M542 Cervicalgia: Secondary | ICD-10-CM | POA: Diagnosis not present

## 2023-02-12 DIAGNOSIS — M25511 Pain in right shoulder: Secondary | ICD-10-CM | POA: Diagnosis not present

## 2023-02-12 DIAGNOSIS — G8929 Other chronic pain: Secondary | ICD-10-CM

## 2023-03-11 NOTE — Progress Notes (Signed)
Jeffrey Klein D.[facility] [address] [place] [address] Phone: [phone]   Assessment and Plan:     1. Chronic right shoulder pain 2. Closed nondisplaced fracture of greater tuberosity of right humerus with routine healing, subsequent encounter  -Chronic with exacerbation, subsequent visit - Overall improvement in right shoulder pain and improvement in ROM after subacromial CSI previous office visit on 02/12/2023, continued HEP, completing course of meloxicam.  Patient is 2 months out from original injury on 01/08/23 which caused subtle nondisplaced greater tuberosity fracture.  I suspect adequate healing of fracture at this time based on NTTP, improving symptoms and improvement ROM.  No repeat imaging at today's visit - Continue to use remaining meloxicam as needed - Recommend using Tylenol for day-to-day pain relief - Continue HEP - Offered physical therapy which patient declined at today's visit but could be considered in the future  Pertinent previous records reviewed include none   Follow Up: 2 months for reevaluation.  If patient continues to have lingering symptoms could consider repeat subacromial CSI versus physical therapy versus advanced imaging   Subjective:   I, Jeffrey Klein, am serving as a Neurosurgeon for Doctor Richardean Sale   Chief Complaint: right shoulder, elbow and knee pain    HPI:    01/15/2023 Patient is a 62 year old male complaining of right shoulder,elbow and knee pain. Patient states that he had a fall July 4th he fell on to the road. He landed on his right side. He had some bruising and swelling. He has decreased ROM due to swelling. No numbness and tingling now, decreased grip strength, he states he feels he is fatigued. He has some scraps and cuts. He has hx of knee arthritis and he notes that he landed on his knee no antalgic gait. Aleve for the pain and he thinks it helps he doesn't endorse to much pain , just  pain from the fall    02/12/2023 Patient states that he is improving but still has some limitations. Thinks he either has whiplash or a dry wall screw in his neck he has decreased ROM.   03/12/2023 Patient states that he has been improving, he is just surprised it is taking so long to heal    Relevant Historical Information: Psoriatic arthritis, gout  Additional pertinent review of systems negative.   Current Outpatient Medications:    allopurinol (ZYLOPRIM) 100 MG tablet, Take 1 tablet (100 mg total) by mouth daily., Disp: 90 tablet, Rfl: 3   atorvastatin (LIPITOR) 20 MG tablet, Take 1 tablet (20 mg total) by mouth daily., Disp: 90 tablet, Rfl: 3   betamethasone valerate (VALISONE) 0.1 % cream, Apply topically once daily as needed, Disp: 45 g, Rfl: 0   Ixekizumab (TALTZ) 80 MG/ML SOSY, INJECT 1 SYRINGE SUBCUTANEOUSLY  EVERY 4 WEEKS, Disp: 3 mL, Rfl: 0   meloxicam (MOBIC) 15 MG tablet, Take 1 tablet (15 mg total) by mouth daily., Disp: 30 tablet, Rfl: 0   Misc Natural Products (JOINT SUPPORT PO), Take 1 tablet by mouth daily., Disp: , Rfl:    Omega-3 Fatty Acids (FISH OIL PO), Take by mouth daily., Disp: , Rfl:    testosterone (TESTIM) 50 MG/5GM (1%) GEL, Place 5 g onto the skin daily., Disp: 150 g, Rfl: 5   Testosterone 12.5 MG/ACT (1%) GEL, APPLY FOUR PUMPS INTO THE SKIN DAILY, Disp: 150 g, Rfl: 5   VITAMIN D PO, Take by mouth daily., Disp: , Rfl:  Objective:     Vitals:   03/12/23 0814  BP: 122/82  Pulse: 75  SpO2: 96%  Weight: 221 lb (100.2 kg)  Height: 5\' 10"  (1.778 m)      Body mass index is 31.71 kg/m.    Physical Exam:    Gen: Appears well, nad, nontoxic and pleasant Neuro:sensation intact, strength is 5/5 with df/pf/inv/ev, muscle tone wnl Skin: no suspicious lesion or defmority Psych: A&O, appropriate mood and affect   Right shoulder:  No deformity, swelling or muscle wasting No scapular winging FF 180 with painful arc, abd 180 with painful arc, int 10, ext  85 NTTP over the [place], clavicle, ac, coracoid, biceps groove, humerus, deltoid, trapezius, cervical spine Positive Neer, Hawkins, empty can, O'Brien Neg  subscap liftoff, speeds Neg ant drawer, sulcus sign, apprehension Negative Spurling's test bilat FROM of neck     Electronically signed by:  Jeffrey Klein D.[facility] 8:32 AM 03/12/23

## 2023-03-12 ENCOUNTER — Ambulatory Visit (INDEPENDENT_AMBULATORY_CARE_PROVIDER_SITE_OTHER): Payer: 59 | Admitting: Sports Medicine

## 2023-03-12 VITALS — BP 122/82 | HR 75 | Ht 70.0 in | Wt 221.0 lb

## 2023-03-12 DIAGNOSIS — M25511 Pain in right shoulder: Secondary | ICD-10-CM | POA: Diagnosis not present

## 2023-03-12 DIAGNOSIS — G8929 Other chronic pain: Secondary | ICD-10-CM | POA: Diagnosis not present

## 2023-03-12 DIAGNOSIS — S42254D Nondisplaced fracture of greater tuberosity of right humerus, subsequent encounter for fracture with routine healing: Secondary | ICD-10-CM | POA: Diagnosis not present

## 2023-03-31 ENCOUNTER — Encounter: Payer: Self-pay | Admitting: Family Medicine

## 2023-03-31 NOTE — Telephone Encounter (Signed)
See note

## 2023-03-31 NOTE — Telephone Encounter (Signed)
Recommend he schedule an apointment to discuss.  Katina Degree. Jimmey Ralph, MD 03/31/2023 11:50 AM

## 2023-04-03 NOTE — Progress Notes (Signed)
Office Visit Note  Patient: Jeffrey Klein             Date of Birth: [birth year]           MRN: [id number]             PCP: Ardith Dark, MD Referring: Ardith Dark, MD Visit Date: 04/17/2023 Occupation: @GUAROCC @  Subjective:  Medication monitoring   History of Present Illness: Jeffrey Klein is a 62 y.o. male with history of psoriatic arthritis and osteoarthritis.  Patient remains on Taltz 80 mg sq injections every 4 weeks.  He continues to tolerate Taltz without any side effects or injection site reactions.  He denies any signs or symptoms of a sort of arthritis flare.  He denies any new patches of psoriasis.  He continues to have a patch of psoriasis in the umbilical region and uses betamethasone cream topically as needed.  Patient states that he recently had to postpone his Taltz injection by 1 week while recovering from the COVID-19 infection.  He denies any other interruptions in therapy.  He denies any Achilles tendinitis or plantar fasciitis.  He denies any SI joint pain at this time.  He is not currently experiencing a joint swelling.  Patient states that he had a fall on 01/08/2023 at which time he injured his right shoulder.  He was evaluated by Dr. Jean Rosenthal and had x-rays of the right shoulder on 01/15/2023.  Patient states that he had a cortisone injection which has been helpful and he has been trying to perform home exercises to preserve his range of motion.  He states that he did not require surgical intervention.     Activities of Daily Living:  Patient reports morning stiffness for 1 minute  Patient Denies nocturnal pain.  Difficulty dressing/grooming: Denies Difficulty climbing stairs: Denies Difficulty getting out of chair: Denies Difficulty using hands for taps, buttons, cutlery, and/or writing: Denies  Review of Systems  Constitutional:  Negative for fatigue and night sweats.  HENT:  Negative for mouth sores, mouth dryness and nose dryness.   Eyes:  Positive for  dryness. Negative for redness.  Respiratory:  Negative for cough, shortness of breath and difficulty breathing.   Cardiovascular:  Negative for chest pain, palpitations, hypertension, irregular heartbeat and swelling in legs/feet.  Gastrointestinal:  Negative for blood in stool, constipation and diarrhea.  Endocrine: Negative for increased urination.  Genitourinary:  Negative for painful urination.  Musculoskeletal:  Positive for joint pain, joint pain and morning stiffness. Negative for joint swelling, myalgias, muscle weakness, muscle tenderness and myalgias.  Skin:  Negative for color change, rash, hair loss, nodules/bumps, skin tightness, ulcers and sensitivity to sunlight.  Allergic/Immunologic: Negative for susceptible to infections.  Neurological:  Negative for dizziness, fainting, memory loss, night sweats and weakness.  Hematological:  Negative for swollen glands.  Psychiatric/Behavioral:  Negative for depressed mood and sleep disturbance. The patient is not nervous/anxious.     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   Gout 11/18/2019   Dyslipidemia 11/18/2019   Low testosterone 11/18/2019   Diverticulosis 11/18/2019   Psoriatic arthritis (HCC) 11/18/2019  Past Medical History:  Diagnosis Date   Diverticulosis    diverticulitis in 2018, no recurrence   GERD (gastroesophageal reflux disease)    High cholesterol    Psoriasis    Psoriatic arthritis (HCC)     Family History  Problem Relation Age of Onset   Psoriasis Sister    Psoriasis Paternal Grandmother    Healthy Daughter    ADD / ADHD  Son    Drug abuse Son    Colon cancer Neg Hx    Esophageal cancer Neg Hx    Rectal cancer Neg Hx    Stomach cancer Neg Hx    Past Surgical History:  Procedure Laterality Date   COLONOSCOPY  08/29/2016   [facility]. Dr. Louellen Molder: Single 5 mm transverse colon adenoma, sigmoid diverticulosis.  Repeat 5 years   dental surgeries     KNEE ARTHROSCOPY Bilateral    LASIK Bilateral 2006   TONSILLECTOMY     as a child    TOOTH EXTRACTION     WRIST SURGERY Right    Social History   Social History Narrative   Not on file   Immunization History  Administered Date(s) Administered   Influenza Inj Mdck Quad Pf 05/16/2022   Influenza,inj,Quad PF,6-35 Mos 05/19/2022   Influenza-Unspecified 02/21/2021   PFIZER(Purple Top)SARS-COV-2 Vaccination 10/06/2019, 10/31/2019, 03/05/2020   Pneumococcal-Unspecified 08/19/2018   Tdap 11/19/2020   Zoster Recombinant(Shingrix) 04/05/2019, 06/17/2019     Objective: Vital Signs: BP (!) 154/93 (BP Location: Left Arm, Patient Position: Sitting, Cuff Size: Normal)   Pulse 85   Ht 5' 10.87" (1.8 m)   Wt 219 lb 9.6 oz (99.6 kg)   BMI 30.74 kg/m    Physical Exam Vitals and nursing note reviewed.  Constitutional:      Appearance: He is well-developed.  HENT:     Head: Normocephalic and atraumatic.  Eyes:     Conjunctiva/sclera: Conjunctivae normal.     Pupils: Pupils are equal, round, and reactive to light.  Cardiovascular:     Rate and Rhythm: Normal rate and regular rhythm.     Heart sounds: Normal heart sounds.  Pulmonary:     Effort: Pulmonary effort is normal.     Breath sounds: Normal breath sounds.  Abdominal:     General: Bowel sounds are normal.     Palpations: Abdomen is soft.  Musculoskeletal:     Cervical back: Normal range of motion and neck supple.  Skin:    General: Skin is warm and dry.     Capillary Refill: Capillary refill takes less than 2 seconds.  Neurological:     Mental Status: He is  alert and oriented to person, place, and time.  Psychiatric:        Behavior: Behavior normal.      Musculoskeletal Exam: C-spine, thoracic spine, lumbar spine have good range of motion.  No midline spinal tenderness.  No SI joint tenderness.  Right shoulder has limited internal rotation.  Left shoulder is full range of motion.  Elbow joints, wrist joints, MCPs, PIPs, DIPs have good range of motion with no synovitis.  PIP and DIP thickening noted.  Hip joints have good range of motion with no groin pain.  Knee joints have good range of motion with no warmth or effusion.  Ankle joints have good range of motion with no tenderness or joint swelling.  No evidence of Achilles tendinitis.  CDAI Exam: CDAI Score: -- Patient Global: --; Provider Global: -- Swollen: --; Tender: -- Joint  Exam 04/17/2023   No joint exam has been documented for this visit   There is currently no information documented on the homunculus. Go to the Rheumatology activity and complete the homunculus joint exam.  Investigation: No additional findings.  Imaging: No results found.  Recent Labs: Lab Results  Component Value Date   WBC 5.5 02/02/2023   HGB 15.8 02/02/2023   PLT 327.0 02/02/2023   NA 139 02/02/2023   K 4.3 02/02/2023   CL 103 02/02/2023   CO2 25 02/02/2023   GLUCOSE 103 (H) 02/02/2023   BUN 18 02/02/2023   CREATININE 1.04 02/02/2023   BILITOT 0.9 02/02/2023   ALKPHOS 46 02/02/2023   AST 31 02/02/2023   ALT 33 02/02/2023   PROT 7.3 02/02/2023   ALBUMIN 4.8 02/02/2023   CALCIUM 9.9 02/02/2023   GFRAA 107 08/16/2020   QFTBGOLDPLUS NEGATIVE 11/03/2022    Speciality Comments: Inadequate response to Enbrel, Stelara, Cosentyx. He has been on Taltz for 6 years.  Procedures:  No procedures performed Allergies: Patient has no known allergies.    Assessment / Plan:     Visit Diagnoses: Psoriatic arthritis (HCC): He has no synovitis or dactylitis on examination today.  He has not had any signs  or symptoms of a psoriatic arthritis flare.  He has clinically been doing well on Taltz 80 mg subcu days injections every 4 weeks.  He continues to tolerate Taltz without any side effects or injection site reactions.  He states he was diagnosed with COVID-19 about 3 weeks ago and postpone his Taltz injection by 1 week.  He has not had any other interruptions in therapy recently.  He has no evidence of Achilles tendinitis or plantar fasciitis.  He has no SI joint tenderness upon palpation.  No new patches of psoriasis.  He will remain on Taltz as monotherapy.  He was advised to notify us if he develops signs or symptoms of a flare.  He will follow-up in the office in 5 months or sooner if needed.  Psoriasis - Diagnosed in 1985. Umbilical region. No new patches.  He has a prescription for betamethasone valerate 0.1% cream which he applies topically as needed-no refill needed at this time. He will remain on Taltz as prescribed.   High risk medication use - Taltz 80 mg sq injections every 4 weeks-started 09/23/16-initially prescribed by rheum in [place].  Inadequate response to Enbrel, Stelara, and Cosentyx.  CBC and CMP updated on 02/02/23.  He would like to return for lab work next week.  Future orders for CBC and CMP were placed today.  His next lab work will then be due in January and every 3 months to monitor for drug toxicity. TB gold negative on 11/03/22. Discussed the importance of holding taltz if he develops signs or symptoms of an infection and to resume once the infection has completely cleared.  - Plan: CBC with Differential/Platelet, COMPLETE METABOLIC PANEL WITH GFR  Primary osteoarthritis of both hands: PIP and DIP thickening and prominence consistent with osteoarthritis and PsA overlap.  No active inflammation noted.  Complete fist formation bilaterally.   Primary osteoarthritis of both hips: He has good ROM of both hips.  No groin pain currently.    Primary osteoarthritis of both knees -  X-rays 05/2020: Left knee: severe osteoarthritis, severe chondromalacia patella and the right knee reveals mild osteoarthritis, moderate chondromalacia patella. No warmth or effusion noted.   Primary osteoarthritis of both feet: He is not experiencing any discomfort in his feet at  this time.  Good ROM of both ankles.  No evidence of achilles tendonitis or plantar fasciitis.    Idiopathic chronic gout without tophus, unspecified site - He has not had any signs or symptoms of a gout flare.  He has clinically been doing well taking allopurinol 100 mg daily prescribed by Dr. Jimmey Ralph. uric acid: 6.9 on 02/02/2023.  Acute pain of right shoulder: Under care of Dr. Jean Rosenthal.  Fall on 01/08/2023.  X-rays performed on 01/15/2023.  Did not require surgical intervention.  Had a cortisone injection performed.  He has been performing home exercises to improve ROM.  He has upcoming appointment Dr. Jean Rosenthal on 05/14/2023.  Other medical conditions are listed as follows:  Family history of psoriasis in sister  Hyperlipidemia, mixed  History of diverticulosis  Selective IgM deficiency (HCC)  Orders: Orders Placed This Encounter  Procedures   CBC with Differential/Platelet   COMPLETE METABOLIC PANEL WITH GFR   No orders of the defined types were placed in this encounter.   Follow-Up Instructions: Return in about 5 months (around 09/15/2023) for Psoriatic arthritis.   Gearldine Bienenstock, PA-C  Note - This record has been created using Dragon software.  Chart creation errors have been sought, but may not always  have been located. Such creation errors do not reflect on  the standard of medical care.

## 2023-04-15 ENCOUNTER — Other Ambulatory Visit: Payer: Self-pay | Admitting: Physician Assistant

## 2023-04-15 DIAGNOSIS — L409 Psoriasis, unspecified: Secondary | ICD-10-CM

## 2023-04-15 DIAGNOSIS — L4050 Arthropathic psoriasis, unspecified: Secondary | ICD-10-CM

## 2023-04-15 NOTE — Telephone Encounter (Signed)
Last Fill: 01/12/2023  Labs: 02/02/2023 Glucose 103  TB Gold: 11/03/2022 Neg    Next Visit: 04/17/2023   Last Visit: 11/03/2022  DX: Psoriatic arthritis   Current Dose per office note 11/03/2022: Taltz 80 mg sq injections every 4 weeks   Okay to refill Taltz?

## 2023-04-17 ENCOUNTER — Ambulatory Visit: Payer: 59 | Attending: Rheumatology | Admitting: Physician Assistant

## 2023-04-17 ENCOUNTER — Encounter: Payer: Self-pay | Admitting: Physician Assistant

## 2023-04-17 VITALS — BP 154/93 | HR 85 | Ht 70.87 in | Wt 219.6 lb

## 2023-04-17 DIAGNOSIS — L4050 Arthropathic psoriasis, unspecified: Secondary | ICD-10-CM

## 2023-04-17 DIAGNOSIS — M19041 Primary osteoarthritis, right hand: Secondary | ICD-10-CM | POA: Diagnosis not present

## 2023-04-17 DIAGNOSIS — M25511 Pain in right shoulder: Secondary | ICD-10-CM

## 2023-04-17 DIAGNOSIS — L409 Psoriasis, unspecified: Secondary | ICD-10-CM

## 2023-04-17 DIAGNOSIS — M19071 Primary osteoarthritis, right ankle and foot: Secondary | ICD-10-CM

## 2023-04-17 DIAGNOSIS — Z840 Family history of diseases of the skin and subcutaneous tissue: Secondary | ICD-10-CM

## 2023-04-17 DIAGNOSIS — Z8719 Personal history of other diseases of the digestive system: Secondary | ICD-10-CM

## 2023-04-17 DIAGNOSIS — Z79899 Other long term (current) drug therapy: Secondary | ICD-10-CM | POA: Diagnosis not present

## 2023-04-17 DIAGNOSIS — E782 Mixed hyperlipidemia: Secondary | ICD-10-CM

## 2023-04-17 DIAGNOSIS — M160 Bilateral primary osteoarthritis of hip: Secondary | ICD-10-CM

## 2023-04-17 DIAGNOSIS — M19042 Primary osteoarthritis, left hand: Secondary | ICD-10-CM

## 2023-04-17 DIAGNOSIS — M19072 Primary osteoarthritis, left ankle and foot: Secondary | ICD-10-CM

## 2023-04-17 DIAGNOSIS — M1A00X0 Idiopathic chronic gout, unspecified site, without tophus (tophi): Secondary | ICD-10-CM

## 2023-04-17 DIAGNOSIS — M170 Bilateral primary osteoarthritis of knee: Secondary | ICD-10-CM

## 2023-04-17 DIAGNOSIS — D804 Selective deficiency of immunoglobulin M [IgM]: Secondary | ICD-10-CM

## 2023-04-17 NOTE — Patient Instructions (Signed)
Standing Labs We placed an order today for your standing lab work.   Please have your standing labs drawn in 1-2 weeks   Please have your labs drawn 2 weeks prior to your appointment so that the provider can discuss your lab results at your appointment, if possible.  Please note that you may see your imaging and lab results in MyChart before we have reviewed them. We will contact you once all results are reviewed. Please allow our office up to 72 hours to thoroughly review all of the results before contacting the office for clarification of your results.  WALK-IN LAB HOURS  Monday through Thursday from 8:00 am -12:30 pm and 1:00 pm-5:00 pm and Friday from 8:00 am-12:00 pm.  Patients with office visits requiring labs will be seen before walk-in labs.  You may encounter longer than normal wait times. Please allow additional time. Wait times may be shorter on  Monday and Thursday afternoons.  We do not book appointments for walk-in labs. We appreciate your patience and understanding with our staff.   Labs are drawn by [facility]. Please bring your co-pay at the time of your lab draw.  You may receive a bill from [facility] for your lab work.  Please note if you are on Hydroxychloroquine and and an order has been placed for a Hydroxychloroquine level,  you will need to have it drawn 4 hours or more after your last dose.  If you wish to have your labs drawn at another location, please call the office 24 hours in advance so we can fax the orders.  The office is located at [address], [address], [place], [place] [address]   If you have any questions regarding directions or hours of operation,  please call [phone].   As a reminder, please drink plenty of water prior to coming for your lab work. Thanks!

## 2023-05-13 NOTE — Progress Notes (Unsigned)
    Jeffrey Klein [facility] [address] [place] [address] Phone: [phone]   Assessment and Plan:     There are no diagnoses linked to this encounter.  ***   Pertinent previous records reviewed include ***   Follow Up: ***     Subjective:   I, Jeffrey Klein, am serving as a Neurosurgeon for Doctor Richardean Sale   Chief Complaint: right shoulder, elbow and knee pain    HPI:    01/15/2023 Patient is a 62 year old male complaining of right shoulder,elbow and knee pain. Patient states that he had a fall July 4th he fell on to the road. He landed on his right side. He had some bruising and swelling. He has decreased ROM due to swelling. No numbness and tingling now, decreased grip strength, he states he feels he is fatigued. He has some scraps and cuts. He has hx of knee arthritis and he notes that he landed on his knee no antalgic gait. Aleve for the pain and he thinks it helps he doesn't endorse to much pain , just pain from the fall    02/12/2023 Patient states that he is improving but still has some limitations. Thinks he either has whiplash or a dry wall screw in his neck he has decreased ROM.    03/12/2023 Patient states that he has been improving, he is just surprised it is taking so long to heal   05/14/2023 Patient states  Relevant Historical Information: Psoriatic arthritis, gout    Additional pertinent review of systems negative.   Current Outpatient Medications:    allopurinol (ZYLOPRIM) 100 MG tablet, Take 1 tablet (100 mg total) by mouth daily., Disp: 90 tablet, Rfl: 3   atorvastatin (LIPITOR) 20 MG tablet, Take 1 tablet (20 mg total) by mouth daily. (Patient not taking: Reported on 04/17/2023), Disp: 90 tablet, Rfl: 3   betamethasone valerate (VALISONE) 0.1 % cream, Apply topically once daily as needed, Disp: 45 g, Rfl: 0   meloxicam (MOBIC) 15 MG tablet, Take 1 tablet (15 mg total) by mouth daily. (Patient not taking:  Reported on 04/17/2023), Disp: 30 tablet, Rfl: 0   Misc Natural Products (JOINT SUPPORT PO), Take 1 tablet by mouth daily. (Patient not taking: Reported on 04/17/2023), Disp: , Rfl:    Omega-3 Fatty Acids (FISH OIL PO), Take by mouth daily., Disp: , Rfl:    rosuvastatin (CRESTOR) 10 MG tablet, Take 10 mg by mouth daily., Disp: , Rfl:    TALTZ 80 MG/ML injection, INJECT 1 SYRINGE SUBCUTANEOUSLY  EVERY 4 WEEKS, Disp: 1 mL, Rfl: 2   testosterone (TESTIM) 50 MG/5GM (1%) GEL, Place 5 g onto the skin daily. (Patient not taking: Reported on 04/17/2023), Disp: 150 g, Rfl: 5   Testosterone 12.5 MG/ACT (1%) GEL, APPLY FOUR PUMPS INTO THE SKIN DAILY, Disp: 150 g, Rfl: 5   VITAMIN D PO, Take by mouth daily., Disp: , Rfl:    Objective:     There were no vitals filed for this visit.    There is no height or weight on file to calculate BMI.    Physical Exam:    ***   Electronically signed by:  Jeffrey Klein [facility] 7:22 AM 05/13/23

## 2023-05-14 ENCOUNTER — Ambulatory Visit: Payer: 59 | Admitting: Sports Medicine

## 2023-05-14 VITALS — BP 122/84 | HR 83 | Ht 70.0 in | Wt 223.0 lb

## 2023-05-14 DIAGNOSIS — S42254D Nondisplaced fracture of greater tuberosity of right humerus, subsequent encounter for fracture with routine healing: Secondary | ICD-10-CM | POA: Diagnosis not present

## 2023-05-14 DIAGNOSIS — G8929 Other chronic pain: Secondary | ICD-10-CM | POA: Diagnosis not present

## 2023-05-14 DIAGNOSIS — M67911 Unspecified disorder of synovium and tendon, right shoulder: Secondary | ICD-10-CM | POA: Diagnosis not present

## 2023-05-14 DIAGNOSIS — M25511 Pain in right shoulder: Secondary | ICD-10-CM | POA: Diagnosis not present

## 2023-05-20 NOTE — Therapy (Unsigned)
OUTPATIENT PHYSICAL THERAPY SHOULDER EVALUATION   Patient Name: Jeffrey Klein MRN: [id number] DOB:03/13/1961, 62 y.o., male Today's Date: 05/21/2023  END OF SESSION:  PT End of Session - 05/21/23 0842     Visit Number 1    Number of Visits 16    Date for PT Re-Evaluation 08/13/23    Authorization Type [employer]    PT Start Time 0845    PT Stop Time 0924    PT Time Calculation (min) 39 min    Activity Tolerance Patient tolerated treatment well    Behavior During Therapy [facility] for tasks assessed/performed             Past Medical History:  Diagnosis Date   Diverticulosis    diverticulitis in 2018, no recurrence   GERD (gastroesophageal reflux disease)    High cholesterol    Psoriasis    Psoriatic arthritis [facility])    Past Surgical History:  Procedure Laterality Date   COLONOSCOPY  08/29/2016   [facility]. Dr. Louellen Molder: Single 5 mm transverse colon adenoma, sigmoid diverticulosis.  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   Gout 11/18/2019   Dyslipidemia 11/18/2019   Low testosterone 11/18/2019   Diverticulosis 11/18/2019   Psoriatic arthritis (HCC) 11/18/2019    PCP: Jacquiline Doe, MD  REFERRING PROVIDER: Richardean Sale, DO  REFERRING DIAG:  [phone] (ICD-10-CM) - Chronic right shoulder pain  S42.254D (ICD-10-CM) - Closed nondisplaced fracture of greater tuberosity of right humerus with routine  healing, subsequent encounter  M67.911 (ICD-10-CM) - Tendinopathy of right rotator cuff    THERAPY DIAG:  Acute pain of right shoulder  Muscle weakness (generalized)  Rationale for Evaluation and Treatment: Rehabilitation  ONSET DATE: 01/08/23  SUBJECTIVE:                                                                                                                                                                                      SUBJECTIVE STATEMENT: Patient reports that he fell on July 4 he was not paying attention and then fell on  the road while stepping off of a curve onto his right side.  Reports he was bruised and had pain all over his right side and imaging demonstrated right humeral fracture.  He was having a lot of range of motion limitations and pain but since the injury his pain has improved as well as his range of motion.  He has gotten 2 cortisone injections which he feel has also helped.  He also saw a chiropractor for his neck pain but feels that his neck symptoms resolved on their own.  Reports he has difficulties reaching behind his back and reaching up overhead especially with weights or objects.  Patient is very active and is currently performing home renovations and would like to be able to perform all activities without pain or limitations. Reports no numbness or tingling in his hands or arms anymore but did have some initially after the injury. Hand dominance: Ambidextrous  PERTINENT HISTORY: Psoriatic arthritis, gout, hx of right tibia fracture, general arthritis  PAIN:  Are you having pain? Yes: NPRS scale: 2/10 Pain location: right shoulder  Pain description: achy, clicking  Aggravating factors: reaching behind back Relieving factors: rest, avoiding motion, ice  PRECAUTIONS: None  RED FLAGS: None   WEIGHT BEARING RESTRICTIONS: No  FALLS:  Has patient fallen in last 6 months? Yes. Number of falls 1     OCCUPATION: Pensions consultant - still  works  PLOF: Independent  PATIENT GOALS:to have less pain  NEXT MD VISIT:   OBJECTIVE:  Note: Objective measures were completed at Evaluation unless otherwise noted.  DIAGNOSTIC FINDINGS:  R Shoulder xray 01/15/23 IMPRESSION: 1. Likely subtle nondisplaced fracture through the greater tuberosity of the proximal right humerus. Coupled with the findings of an ossific density at the inferior margin of the glenoid, this could reflect sequela from transient glenohumeral dislocation. Alignment on today's exam is anatomic.  PATIENT SURVEYS:  FOTO 65%  COGNITION: Overall cognitive status: Within functional limits for tasks assessed     SENSATION: WFL  POSTURE: Right forward shoulder, reduced anterior pelvic tilt   Cervical AROM:    EVAL     Flexion  WFL     Extension  WFL     R ROT 60      L ROT  60     R SB  25    L SB 25     * Pain   (Blank rows = not tested)   UE Measurements Upper Extremity Right EVAL Left EVAL   A/PROM MMT A/PROM MMT  Shoulder Flexion 160* 3+* 160 4  Shoulder Extension      Shoulder Abduction WFL*^ 4* WFL 4+  Shoulder Adduction      Shoulder Internal Rotation/ At 90 abd Reaches to right lower ribs*/60* 4+ Reaches to T6SP/90 4+  Shoulder External Rotation Reaches tot T 4 SP/90 3+* Reaches to T4 SP/90 4-  Elbow Flexion      Elbow Extension      Wrist Flexion      Wrist Extension      Wrist Supination      Wrist Pronation      Wrist Ulnar Deviation      Wrist Radial Deviation      Grip Strength NA  NA     (Blank rows = not tested)   * pain   ^ shrugs     JOINT MOBILITY TESTING:  hypomobility noted in right shoulder joint in all directions  PALPATION:  Increased resting tone in right  pec/deltoid, RC   TODAY'S TREATMENT:                                                                                                                                         DATE:  05/21/2023  Therapeutic Exercise:  Aerobic: Supine: serratus punch  x15 5" holds B Prone:scapular retraction and shoulder extension x15 5" holds B - cues for reduced lumbar rotation  Seated:  Standing: Neuromuscular Re-education: Manual Therapy:long axis traction of right shoulder - tolerated well Therapeutic Activity: Self Care: Trigger Point Dry Needling:  Modalities:    PATIENT EDUCATION:  Education details: on current presentation, on HEP, on clinical outcomes score and POC, on anatomy, on rationale behind interventions Person educated: Patient Education method: Explanation, Demonstration, and Handouts Education comprehension: verbalized understanding   HOME EXERCISE PROGRAM: [id number]  ASSESSMENT:  CLINICAL IMPRESSION: Patient presents to physical therapy with complaints of right shoulder pain and stiffness after fall in July of this year and subsequent fracture.  Overall patient is doing very well but limited especially in internal rotation and functional activities reaching behind his back.  Educated patient current presentation as well as plan moving forward and responded well to initial treatment session demonstrating improved range of motion after exercises.  Patient would greatly benefit from skilled PT to reduce further risk of injury and improve functional mobility at home and in the community.  OBJECTIVE IMPAIRMENTS: decreased activity tolerance, decreased ROM, decreased strength, improper body mechanics, postural dysfunction, and pain.   ACTIVITY LIMITATIONS: lifting, bathing, dressing, and reach over head  PARTICIPATION LIMITATIONS: cleaning and yard work  PERSONAL FACTORS: Age, Fitness, and Time since onset of injury/illness/exacerbation are also affecting patient's functional outcome.   REHAB POTENTIAL: Good  CLINICAL DECISION MAKING: Stable/uncomplicated  EVALUATION COMPLEXITY: Low   GOALS: Goals reviewed with patient? yes  SHORT TERM GOALS: Target date: 07/02/2023   Patient will be independent in self management  strategies to improve quality of life and functional outcomes. Baseline: New Program Goal status: INITIAL  2.  Patient will report at least 50% improvement in overall symptoms and/or function to demonstrate improved functional mobility Baseline: 0% better Goal status: INITIAL  3.  Patient will be able to wash back with right hand without difficulties to demonstrate improved overall function Baseline: unable with right Goal status: INITIAL       LONG TERM GOALS: Target date: 08/13/2023    Patient will report at least 75% improvement in overall symptoms and/or function to demonstrate improved functional mobility Baseline: 0% better Goal status: INITIAL  2.  Patient will improve score on FOTO outcomes measure to projected score to demonstrate overall improved function and QOL Baseline: see above Goal status: INITIAL  3.  Patient will demonstrate painfree MMT in R UE Baseline: painful Goal status: INITIAL  4.  Patient will be able to demonstrate at least 75 degrees of right shoulder IR at 90 abd  Baseline: see above Goal status: INITIAL   PLAN:  PT FREQUENCY:1- 2x/week for a total of 16 visits over 12 week certification period  PT DURATION: 12 weeks  PLANNED INTERVENTIONS: 97110-Therapeutic exercises, 97530- Therapeutic activity, 97112- Neuromuscular re-education, 97535- Self Care, 16109- Manual therapy, [phone]- Gait training, [phone]- Orthotic Fit/training, [phone]- Canalith repositioning, [id number]- Aquatic Therapy, 97014- Electrical stimulation (unattended), [phone]- Ionotophoresis 4mg /ml Dexamethasone, Patient/Family education, Balance training, Stair training, Taping, Dry Needling, Joint mobilization, Joint manipulation, Spinal manipulation, Spinal mobilization, Cryotherapy, and Moist heat  PLAN FOR NEXT SESSION: joint mobilization, STM, scapular movement and strengthening - ROM/MMT, thoracic mobility   10:15 AM, 05/21/23 Tereasa Coop, DPT Physical Therapy with [facility]

## 2023-05-21 ENCOUNTER — Encounter: Payer: Self-pay | Admitting: Physical Therapy

## 2023-05-21 ENCOUNTER — Ambulatory Visit (INDEPENDENT_AMBULATORY_CARE_PROVIDER_SITE_OTHER): Payer: 59 | Admitting: Physical Therapy

## 2023-05-21 DIAGNOSIS — M6281 Muscle weakness (generalized): Secondary | ICD-10-CM | POA: Diagnosis not present

## 2023-05-21 DIAGNOSIS — M25511 Pain in right shoulder: Secondary | ICD-10-CM | POA: Diagnosis not present

## 2023-05-25 ENCOUNTER — Ambulatory Visit (INDEPENDENT_AMBULATORY_CARE_PROVIDER_SITE_OTHER): Payer: 59 | Admitting: Physical Therapy

## 2023-05-25 ENCOUNTER — Encounter: Payer: Self-pay | Admitting: Physical Therapy

## 2023-05-25 DIAGNOSIS — M6281 Muscle weakness (generalized): Secondary | ICD-10-CM | POA: Diagnosis not present

## 2023-05-25 DIAGNOSIS — M25511 Pain in right shoulder: Secondary | ICD-10-CM

## 2023-05-25 NOTE — Therapy (Signed)
OUTPATIENT PHYSICAL THERAPY SHOULDER TREATMENT   Patient Name: Jeffrey Klein MRN: [id number] DOB:September 23, 1960, 62 y.o., male Today's Date: 05/25/2023  END OF SESSION:  PT End of Session - 05/25/23 0844     Visit Number 2    Number of Visits 16    Date for PT Re-Evaluation 08/13/23    Authorization Type U[employer]    PT Start Time 0845    PT Stop Time 0927    PT Time Calculation (min) 42 min    Activity Tolerance Patient tolerated treatment well    Behavior During Therapy [facility] for tasks assessed/performed              Past Medical History:  Diagnosis Date   Diverticulosis    diverticulitis in 2018, no recurrence   GERD (gastroesophageal reflux disease)    High cholesterol    Psoriasis    Psoriatic arthritis [facility])    Past Surgical History:  Procedure Laterality Date   COLONOSCOPY  08/29/2016   [facility]. Dr. Louellen Molder: Single 5 mm transverse colon adenoma, sigmoid diverticulosis.  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   Gout 11/18/2019   Dyslipidemia 11/18/2019   Low testosterone 11/18/2019   Diverticulosis 11/18/2019   Psoriatic arthritis (HCC) 11/18/2019    PCP: Jacquiline Doe, MD  REFERRING PROVIDER: Richardean Sale, DO  REFERRING DIAG:  [phone] (ICD-10-CM) - Chronic right shoulder pain  S42.254D (ICD-10-CM) - Closed nondisplaced fracture of greater tuberosity of right humerus with routine  healing, subsequent encounter  M67.911 (ICD-10-CM) - Tendinopathy of right rotator cuff    THERAPY DIAG:  Acute pain of right shoulder  Muscle weakness (generalized)  Rationale for Evaluation and Treatment: Rehabilitation  ONSET DATE: 01/08/23  SUBJECTIVE:                                                                                                                                                                                      SUBJECTIVE STATEMENT: 05/25/2023 Been feeing good, and felt good after last session and it lasted a good bit.  States he has done his exercises a couple times. Been busy over the weekend. Continues to pain some popping.   EVAL: Patient reports that he fell on July 4 he was not paying attention and then fell on the road while stepping off of a curve onto his right side.  Reports he was bruised and had pain all over his right side and imaging demonstrated right humeral fracture.  He was having a lot of range of motion limitations and pain but since the injury his pain has improved as well as his range of motion.  He has gotten 2 cortisone injections which he feel has also helped.  He also saw a chiropractor for his neck pain but feels that his neck symptoms resolved on their own.  Reports he has difficulties reaching behind his back and reaching up overhead especially with weights or objects.  Patient is very active and is currently performing home renovations and would like to be able to perform all activities without pain or limitations. Reports no numbness or tingling in his hands or arms anymore but did have some initially after the injury. Hand dominance: Ambidextrous  PERTINENT HISTORY: Psoriatic arthritis, gout, hx of right tibia fracture, general arthritis  PAIN:  Are you having pain? Yes: NPRS scale: 2/10 Pain location: right shoulder  Pain description: achy, clicking  Aggravating factors: reaching behind back Relieving factors: rest, avoiding motion,  ice  PRECAUTIONS: None  RED FLAGS: None   WEIGHT BEARING RESTRICTIONS: No  FALLS:  Has patient fallen in last 6 months? Yes. Number of falls 1     OCCUPATION: Pensions consultant - still works  PLOF: Independent  PATIENT GOALS:to have less pain  NEXT MD VISIT:   OBJECTIVE:  Note: Objective measures were completed at Evaluation unless otherwise noted.  DIAGNOSTIC FINDINGS:  R Shoulder xray 01/15/23 IMPRESSION: 1. Likely subtle nondisplaced fracture through the greater tuberosity of the proximal right humerus. Coupled with the findings of an ossific density at the inferior margin of the glenoid, this could reflect sequela from transient glenohumeral dislocation. Alignment on today's exam is anatomic.  PATIENT SURVEYS:  FOTO 65%  COGNITION: Overall cognitive status: Within functional limits for tasks assessed     SENSATION: WFL  POSTURE: Right forward shoulder, reduced anterior pelvic tilt   Cervical AROM:    EVAL     Flexion  WFL     Extension  WFL     R ROT 60      L ROT  60     R SB  25    L SB 25     * Pain   (Blank rows = not tested)   UE Measurements Upper Extremity Right EVAL Left EVAL   A/PROM MMT A/PROM MMT  Shoulder Flexion 160* 3+* 160 4  Shoulder Extension      Shoulder Abduction WFL*^ 4* WFL 4+  Shoulder Adduction      Shoulder Internal Rotation/ At 90 abd Reaches to right lower ribs*/60* 4+ Reaches to T6SP/90 4+  Shoulder External Rotation Reaches tot T 4 SP/90 3+* Reaches to T4 SP/90 4-  Elbow Flexion      Elbow Extension      Wrist Flexion      Wrist Extension      Wrist Supination      Wrist Pronation      Wrist Ulnar Deviation      Wrist Radial Deviation      Grip Strength NA  NA     (  Blank rows = not tested)   * pain   ^ shrugs     JOINT MOBILITY TESTING:  hypomobility noted in right shoulder joint in all directions  PALPATION:  Increased resting tone in right pec/deltoid, RC   TODAY'S TREATMENT:                                                                                                                                          DATE:  05/25/2023  Therapeutic Exercise:  Aerobic: Supine: laying on rolled towel: serratus punch then with ROW  2 minutes B, horizontal shoulder abd 2  minutes, shoulder flexion with cues for flat back 2 minutes Prone:scapular retraction and shoulder extension 2x15 5" holds B - cues for reduced lumbar rotation --> improved mobility after supine interventions, shoulder flexion - 2 minutes, shoulder T - 2x12 B  Seated:  Standing: Neuromuscular Re-education: Manual Therapy:pa TO t-SPINE AND RIBS GRADE ii/ii - TOLERATED WELL.STM to thoracic paraspinals - tolerated well  Therapeutic Activity: Self Care: Trigger Point Dry Needling:  Modalities:    PATIENT EDUCATION:  Education details: on HEP and rationale behind interventions Person educated: Patient Education method: Explanation, Demonstration, and Handouts Education comprehension: verbalized understanding   HOME EXERCISE PROGRAM: [id number]  ASSESSMENT:  CLINICAL IMPRESSION: 05/25/2023 Progressed interventions and tolerated all well. Thoracic mobilization tolerated very well with improved mobility noted afterwards. Improved ability to reach behind back. Educated patient in movement mechanics to reduce anterior humeral stress/pain. Tolerated this well. Will continue with current POC as tolerated.   EVAL: Patient presents to physical therapy with complaints of right shoulder pain and stiffness after fall in July of this year and subsequent fracture.  Overall patient is doing very well but limited especially in internal rotation and functional activities reaching behind his back.  Educated patient current presentation as well as plan moving forward and responded well to initial treatment session demonstrating improved range of motion after exercises.  Patient would greatly benefit from skilled PT to reduce further risk of  injury and improve functional mobility at home and in the community.  OBJECTIVE IMPAIRMENTS: decreased activity tolerance, decreased ROM, decreased strength, improper body mechanics, postural dysfunction, and pain.   ACTIVITY LIMITATIONS: lifting, bathing, dressing, and reach over head  PARTICIPATION LIMITATIONS: cleaning and yard work  PERSONAL FACTORS: Age, Fitness, and Time since onset of injury/illness/exacerbation are also affecting patient's functional outcome.   REHAB POTENTIAL: Good  CLINICAL DECISION MAKING: Stable/uncomplicated  EVALUATION COMPLEXITY: Low   GOALS: Goals reviewed with patient? yes  SHORT TERM GOALS: Target date: 07/02/2023   Patient will be independent in self management strategies to improve quality of life and functional outcomes. Baseline: New Program Goal status: INITIAL  2.  Patient will report at least 50% improvement in overall symptoms and/or function to demonstrate improved functional mobility Baseline: 0% better Goal status: INITIAL  3.  Patient will be able to wash  back with right hand without difficulties to demonstrate improved overall function Baseline: unable with right Goal status: INITIAL       LONG TERM GOALS: Target date: 08/13/2023    Patient will report at least 75% improvement in overall symptoms and/or function to demonstrate improved functional mobility Baseline: 0% better Goal status: INITIAL  2.  Patient will improve score on FOTO outcomes measure to projected score to demonstrate overall improved function and QOL Baseline: see above Goal status: INITIAL  3.  Patient will demonstrate painfree MMT in R UE Baseline: painful Goal status: INITIAL  4.  Patient will be able to demonstrate at least 75 degrees of right shoulder IR at 90 abd  Baseline: see above Goal status: INITIAL   PLAN:  PT FREQUENCY:1- 2x/week for a total of 16 visits over 12 week certification period  PT DURATION: 12 weeks  PLANNED  INTERVENTIONS: 97110-Therapeutic exercises, 97530- Therapeutic activity, 97112- Neuromuscular re-education, 97535- Self Care, 54098- Manual therapy, [phone]- Gait training, [phone]- Orthotic Fit/training, [phone]- Canalith repositioning, [id number]- Aquatic Therapy, 97014- Electrical stimulation (unattended), [phone]- Ionotophoresis 4mg /ml Dexamethasone, Patient/Family education, Balance training, Stair training, Taping, Dry Needling, Joint mobilization, Joint manipulation, Spinal manipulation, Spinal mobilization, Cryotherapy, and Moist heat  PLAN FOR NEXT SESSION: t spine mobilization, joint mobilization, STM, scapular movement and strengthening - ROM/MMT, thoracic mobility   9:31 AM, 05/25/23 Tereasa Coop, DPT Physical Therapy with [facility]

## 2023-05-28 ENCOUNTER — Ambulatory Visit (INDEPENDENT_AMBULATORY_CARE_PROVIDER_SITE_OTHER): Payer: 59 | Admitting: Physical Therapy

## 2023-05-28 ENCOUNTER — Encounter: Payer: Self-pay | Admitting: Physical Therapy

## 2023-05-28 DIAGNOSIS — M6281 Muscle weakness (generalized): Secondary | ICD-10-CM | POA: Diagnosis not present

## 2023-05-28 DIAGNOSIS — M25511 Pain in right shoulder: Secondary | ICD-10-CM

## 2023-05-28 NOTE — Therapy (Signed)
OUTPATIENT PHYSICAL THERAPY SHOULDER TREATMENT   Patient Name: Jeffrey Klein MRN: [id number] DOB:1961-05-17, 62 y.o., male Today's Date: 05/28/2023  END OF SESSION:  PT End of Session - 05/28/23 0802     Visit Number 3    Number of Visits 16    Date for PT Re-Evaluation 08/13/23    Authorization Type [employer]    PT Start Time 0803    PT Stop Time 0841    PT Time Calculation (min) 38 min    Activity Tolerance Patient tolerated treatment well    Behavior During Therapy [facility] for tasks assessed/performed              Past Medical History:  Diagnosis Date   Diverticulosis    diverticulitis in 2018, no recurrence   GERD (gastroesophageal reflux disease)    High cholesterol    Psoriasis    Psoriatic arthritis [facility])    Past Surgical History:  Procedure Laterality Date   COLONOSCOPY  08/29/2016   [facility]. Dr. Louellen Molder: Single 5 mm transverse colon adenoma, sigmoid diverticulosis.  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   Gout 11/18/2019   Dyslipidemia 11/18/2019   Low testosterone 11/18/2019   Diverticulosis 11/18/2019   Psoriatic arthritis (HCC) 11/18/2019    PCP: Jacquiline Doe, MD  REFERRING PROVIDER: Richardean Sale, DO  REFERRING DIAG:  [phone] (ICD-10-CM) - Chronic right shoulder pain  S42.254D (ICD-10-CM) - Closed nondisplaced fracture of greater tuberosity of right humerus with routine  healing, subsequent encounter  M67.911 (ICD-10-CM) - Tendinopathy of right rotator cuff    THERAPY DIAG:  Acute pain of right shoulder  Muscle weakness (generalized)  Rationale for Evaluation and Treatment: Rehabilitation  ONSET DATE: 01/08/23  SUBJECTIVE:                                                                                                                                                                                      SUBJECTIVE STATEMENT: 05/28/2023 States he is doing well. Shoulder is feeling better every time. Reports his hip is  acting up more than his shoulder. States his right hip is a little bit of an ache on the front of it.   EVAL: Patient reports that he fell on July 4 he was not paying attention and then fell on the road while stepping off of a curve onto his right side.  Reports he was bruised and had pain all over his right side and imaging demonstrated right humeral fracture.  He was having a lot of range of motion limitations and pain but since the injury his pain has improved as well as his range of motion.  He has gotten 2 cortisone injections which he feel has also helped.  He also saw a chiropractor for his neck pain but feels that his neck symptoms resolved on their own.  Reports he has difficulties reaching behind his back and reaching up overhead especially with weights or objects.  Patient is very active and is currently performing home renovations and would like to be able to perform all activities without pain or limitations. Reports no numbness or tingling in his hands or arms anymore but did have some initially after the injury. Hand dominance: Ambidextrous  PERTINENT HISTORY: Psoriatic arthritis, gout, hx of right tibia fracture, general arthritis  PAIN:  Are you having pain? Yes: NPRS scale: 3/10 Pain location: right shoulder  Pain description: achy, clicking  Aggravating factors: reaching behind back Relieving factors: rest, avoiding motion,  ice  PRECAUTIONS: None  RED FLAGS: None   WEIGHT BEARING RESTRICTIONS: No  FALLS:  Has patient fallen in last 6 months? Yes. Number of falls 1     OCCUPATION: Pensions consultant - still works  PLOF: Independent  PATIENT GOALS:to have less pain  NEXT MD VISIT:   OBJECTIVE:  Note: Objective measures were completed at Evaluation unless otherwise noted.  DIAGNOSTIC FINDINGS:  R Shoulder xray 01/15/23 IMPRESSION: 1. Likely subtle nondisplaced fracture through the greater tuberosity of the proximal right humerus. Coupled with the findings of an ossific density at the inferior margin of the glenoid, this could reflect sequela from transient glenohumeral dislocation. Alignment on today's exam is anatomic.  PATIENT SURVEYS:  FOTO 65%  COGNITION: Overall cognitive status: Within functional limits for tasks assessed     SENSATION: WFL  POSTURE: Right forward shoulder, reduced anterior pelvic tilt   Cervical AROM:    EVAL     Flexion  WFL     Extension  WFL     R ROT 60      L ROT  60     R SB  25    L SB 25     * Pain   (Blank rows = not tested)   UE Measurements Upper Extremity Right EVAL Left EVAL   A/PROM MMT A/PROM MMT  Shoulder Flexion 160* 3+* 160 4  Shoulder Extension      Shoulder Abduction WFL*^ 4* WFL 4+  Shoulder Adduction      Shoulder Internal Rotation/ At 90 abd Reaches to right lower ribs*/60* 4+ Reaches to T6SP/90 4+  Shoulder External Rotation Reaches tot T 4 SP/90 3+* Reaches to T4 SP/90 4-  Elbow Flexion      Elbow Extension      Wrist Flexion      Wrist Extension      Wrist Supination      Wrist Pronation      Wrist Ulnar Deviation      Wrist Radial Deviation      Grip Strength NA  NA     (  Blank rows = not tested)   * pain   ^ shrugs     JOINT MOBILITY TESTING:  hypomobility noted in right shoulder joint in all directions  PALPATION:  Increased resting tone in right pec/deltoid, RC   TODAY'S TREATMENT:                                                                                                                                          DATE:  05/28/2023  Therapeutic Exercise:  Aerobic: Supine: piriformis stretch x2 30" holds, thomas stretch x2, PROM R shoulder flexion/IR - tolerated well - 6 minutes  S/l shoulder ER 3# DB 5" holds 4x5 R  30" B Prone: right shoulder IR//ER at 90 abd x4 1 min bouts      Seated:  Standing: Neuromuscular Re-education: Manual Therapy: Therapeutic Activity: Self Care: Trigger Point Dry Needling:  Modalities:    PATIENT EDUCATION:  Education details: on HEP and rationale behind interventions Person educated: Patient Education method: Explanation, Demonstration, and Handouts Education comprehension: verbalized understanding   HOME EXERCISE PROGRAM: [id number]  ASSESSMENT:  CLINICAL IMPRESSION: 05/28/2023 Added hip stretches secondary to complaints of hip pain. Tolerated this well with reduced tension noted afterwards. Overall improved shoulder IR noted. Added strengthening exercises. No pain noted during or after sessio, will continue with current POC as tolerated.   EVAL: Patient presents to physical therapy with complaints of right shoulder pain and stiffness after fall in July of this year and subsequent fracture.  Overall patient is doing very well but limited especially in internal rotation and functional activities reaching behind his back.  Educated patient current presentation as well as plan moving forward and responded well to initial treatment session demonstrating improved range of motion after exercises.  Patient would greatly benefit from skilled PT to reduce further risk of injury and improve functional mobility at home and in the community.  OBJECTIVE IMPAIRMENTS: decreased activity tolerance, decreased ROM, decreased strength, improper body mechanics, postural dysfunction, and pain.   ACTIVITY LIMITATIONS: lifting, bathing, dressing, and reach over  head  PARTICIPATION LIMITATIONS: cleaning and yard work  PERSONAL FACTORS: Age, Fitness, and Time since onset of injury/illness/exacerbation are also affecting patient's functional outcome.   REHAB POTENTIAL: Good  CLINICAL DECISION MAKING: Stable/uncomplicated  EVALUATION COMPLEXITY: Low   GOALS: Goals reviewed with patient? yes  SHORT TERM GOALS: Target date: 07/02/2023   Patient will be independent in self management strategies to improve quality of life and functional outcomes. Baseline: New Program Goal status: INITIAL  2.  Patient will report at least 50% improvement in overall symptoms and/or function to demonstrate improved functional mobility Baseline: 0% better Goal status: INITIAL  3.  Patient will be able to wash back with right hand without difficulties to demonstrate improved overall function Baseline: unable with right Goal status: INITIAL       LONG TERM GOALS: Target date: 08/13/2023  Patient will report at least 75% improvement in overall symptoms and/or function to demonstrate improved functional mobility Baseline: 0% better Goal status: INITIAL  2.  Patient will improve score on FOTO outcomes measure to projected score to demonstrate overall improved function and QOL Baseline: see above Goal status: INITIAL  3.  Patient will demonstrate painfree MMT in R UE Baseline: painful Goal status: INITIAL  4.  Patient will be able to demonstrate at least 75 degrees of right shoulder IR at 90 abd  Baseline: see above Goal status: INITIAL   PLAN:  PT FREQUENCY:1- 2x/week for a total of 16 visits over 12 week certification period  PT DURATION: 12 weeks  PLANNED INTERVENTIONS: 97110-Therapeutic exercises, 97530- Therapeutic activity, 97112- Neuromuscular re-education, 97535- Self Care, 16109- Manual therapy, [phone]- Gait training, [phone]- Orthotic Fit/training, [phone]- Canalith repositioning, [id number]- Aquatic Therapy, 97014- Electrical stimulation  (unattended), [phone]- Ionotophoresis 4mg /ml Dexamethasone, Patient/Family education, Balance training, Stair training, Taping, Dry Needling, Joint mobilization, Joint manipulation, Spinal manipulation, Spinal mobilization, Cryotherapy, and Moist heat  PLAN FOR NEXT SESSION: t spine mobilization, joint mobilization, STM, scapular movement and strengthening - ROM/MMT, thoracic mobility   8:47 AM, 05/28/23 Tereasa Coop, DPT Physical Therapy with [facility]

## 2023-06-01 ENCOUNTER — Encounter: Payer: Self-pay | Admitting: Physical Therapy

## 2023-06-01 ENCOUNTER — Ambulatory Visit (INDEPENDENT_AMBULATORY_CARE_PROVIDER_SITE_OTHER): Payer: 59 | Admitting: Physical Therapy

## 2023-06-01 DIAGNOSIS — M25511 Pain in right shoulder: Secondary | ICD-10-CM | POA: Diagnosis not present

## 2023-06-01 DIAGNOSIS — M6281 Muscle weakness (generalized): Secondary | ICD-10-CM

## 2023-06-01 NOTE — Therapy (Signed)
OUTPATIENT PHYSICAL THERAPY SHOULDER TREATMENT   Patient Name: Jeffrey Klein MRN: [id number] DOB:10/25/1960, 62 y.o., male Today's Date: 06/01/2023  END OF SESSION:  PT End of Session - 06/01/23 0755     Visit Number 4    Number of Visits 16    Date for PT Re-Evaluation 08/13/23    Authorization Type UHC    PT Start Time 0802    PT Stop Time 0842    PT Time Calculation (min) 40 min    Activity Tolerance Patient tolerated treatment well    Behavior During Therapy [facility] for tasks assessed/performed              Past Medical History:  Diagnosis Date   Diverticulosis    diverticulitis in 2018, no recurrence   GERD (gastroesophageal reflux disease)    High cholesterol    Psoriasis    Psoriatic arthritis [facility])    Past Surgical History:  Procedure Laterality Date   COLONOSCOPY  08/29/2016   [facility]. Dr. Louellen Molder: Single 5 mm transverse colon adenoma, sigmoid diverticulosis.  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   Gout 11/18/2019   Dyslipidemia 11/18/2019   Low testosterone 11/18/2019   Diverticulosis 11/18/2019   Psoriatic arthritis (HCC) 11/18/2019    PCP: Jacquiline Doe, MD  REFERRING PROVIDER: Richardean Sale, DO  REFERRING DIAG:  [phone] (ICD-10-CM) - Chronic right shoulder pain  S42.254D (ICD-10-CM) - Closed nondisplaced fracture of greater tuberosity of right humerus with routine  healing, subsequent encounter  M67.911 (ICD-10-CM) - Tendinopathy of right rotator cuff    THERAPY DIAG:  Acute pain of right shoulder  Muscle weakness (generalized)  Rationale for Evaluation and Treatment: Rehabilitation  ONSET DATE: 01/08/23  SUBJECTIVE:                                                                                                                                                                                      SUBJECTIVE STATEMENT: 06/01/2023 States his hip is angry, exercises are helping. States that at the end of the  day he can't get comfortable.   EVAL: Patient reports that he fell on July 4 he was not paying attention and then fell on the road while stepping off of a curve onto his right side.  Reports he was bruised and had pain all over his right side and imaging demonstrated right humeral fracture.  He was having a lot of range of motion limitations and pain but since the injury his pain has improved as well as his range of motion.  He has gotten 2 cortisone injections which he feel has also helped.  He also saw a chiropractor for his neck pain but feels that his neck symptoms resolved on their own.  Reports he has difficulties reaching behind his back and reaching up overhead especially with weights or objects.  Patient is very active and is currently performing home renovations and would like to be able to perform all activities without pain or limitations. Reports no numbness or tingling in his hands or arms anymore but did have some initially after the injury. Hand dominance: Ambidextrous  PERTINENT HISTORY: Psoriatic arthritis, gout, hx of right tibia fracture, general arthritis  PAIN:  Are you having pain? Yes: NPRS scale: 3/10 Pain location: hips Pain description: achy, clicking  Aggravating factors: reaching behind back Relieving factors: rest, avoiding motion, ice  PRECAUTIONS: None  RED FLAGS: None   WEIGHT BEARING RESTRICTIONS:  No  FALLS:  Has patient fallen in last 6 months? Yes. Number of falls 1     OCCUPATION: Pensions consultant - still works  PLOF: Independent  PATIENT GOALS:to have less pain  NEXT MD VISIT:   OBJECTIVE:  Note: Objective measures were completed at Evaluation unless otherwise noted.  DIAGNOSTIC FINDINGS:  R Shoulder xray 01/15/23 IMPRESSION: 1. Likely subtle nondisplaced fracture through the greater tuberosity of the proximal right humerus. Coupled with the findings of an ossific density at the inferior margin of the glenoid, this could reflect sequela from transient glenohumeral dislocation. Alignment on today's exam is anatomic.  PATIENT SURVEYS:  FOTO 65%  COGNITION: Overall cognitive status: Within functional limits for tasks assessed     SENSATION: WFL  POSTURE: Right forward shoulder, reduced anterior pelvic tilt   Cervical AROM:    EVAL     Flexion  WFL     Extension  WFL     R ROT 60      L ROT  60     R SB  25    L SB 25     * Pain   (Blank rows = not tested)   UE Measurements Upper Extremity Right EVAL Left EVAL   A/PROM MMT A/PROM MMT  Shoulder Flexion 160* 3+* 160 4  Shoulder Extension      Shoulder Abduction WFL*^ 4* WFL 4+  Shoulder Adduction      Shoulder Internal Rotation/ At 90 abd Reaches to right lower ribs*/60* 4+ Reaches to T6SP/90 4+  Shoulder External Rotation Reaches tot T 4 SP/90 3+* Reaches to T4 SP/90 4-  Elbow Flexion      Elbow Extension      Wrist Flexion      Wrist Extension      Wrist Supination      Wrist Pronation      Wrist Ulnar Deviation      Wrist Radial Deviation      Grip Strength NA  NA     (Blank rows = not tested)   * pain   ^ shrugs  JOINT MOBILITY TESTING:  hypomobility noted in right shoulder joint in all directions  PALPATION:  Increased resting tone in right pec/deltoid, RC   TODAY'S TREATMENT:                                                                                                                                          DATE:  06/01/2023  Therapeutic Exercise:  Aerobic: Supine: piriformis stretch x2 30" holds, hip bent knee fall outs - 4 minutes  Quad: circles clockwise and counter clockwise - x10 E    Seated:  Standing: Neuromuscular Re-education:sit bone education - 6 minutes- supine pelvic tilts 8 minutes - then quad 6 minutes Manual Therapy: Therapeutic Activity: Self Care: Trigger Point Dry Needling:  Modalities: thermotherapy to low back during supine interventions   PATIENT EDUCATION:  Education details: on HEP and rationale behind interventions, on anatomy, on changing positions/stretching every 20 minutes  Person educated: Patient Education method: Explanation, Demonstration, and Handouts Education comprehension: verbalized understanding   HOME EXERCISE PROGRAM: [id number]  ASSESSMENT:  CLINICAL IMPRESSION: 06/01/2023 Session focused on pelvic motion secondary to hip pain bothering him. Tolerated interventions well but very difficult to perform active pelvic motions initially. This improved with practice. Added to HEP. Reduced hip pain and shoulder fatigue noted end of session. Will continue with current POC as tolerated.   EVAL: Patient presents to physical therapy with complaints of right shoulder pain and stiffness after fall in July of this year and subsequent fracture.  Overall patient is doing very well but limited especially in internal rotation and functional activities reaching behind his back.  Educated patient current presentation as well as plan moving forward and responded well to initial treatment session demonstrating improved range of motion after exercises.  Patient would greatly benefit from skilled PT to reduce further risk of injury and improve functional mobility at home and in the community.  OBJECTIVE IMPAIRMENTS: decreased activity tolerance, decreased ROM, decreased strength, improper body mechanics, postural dysfunction, and  pain.   ACTIVITY LIMITATIONS: lifting, bathing, dressing, and reach over head  PARTICIPATION LIMITATIONS: cleaning and yard work  PERSONAL FACTORS: Age, Fitness, and Time since onset of injury/illness/exacerbation are also affecting patient's functional outcome.   REHAB POTENTIAL: Good  CLINICAL DECISION MAKING: Stable/uncomplicated  EVALUATION COMPLEXITY: Low   GOALS: Goals reviewed with patient? yes  SHORT TERM GOALS: Target date: 07/02/2023   Patient will be independent in self management strategies to improve quality of life and functional outcomes. Baseline: New Program Goal status: INITIAL  2.  Patient will report at least 50% improvement in overall symptoms and/or function to demonstrate improved functional mobility Baseline: 0% better Goal status: INITIAL  3.  Patient will be able to wash back with right hand without difficulties to demonstrate improved overall function Baseline: unable with right Goal status: INITIAL       LONG TERM GOALS: Target date: 08/13/2023  Patient will report at least 75% improvement in overall symptoms and/or function to demonstrate improved functional mobility Baseline: 0% better Goal status: INITIAL  2.  Patient will improve score on FOTO outcomes measure to projected score to demonstrate overall improved function and QOL Baseline: see above Goal status: INITIAL  3.  Patient will demonstrate painfree MMT in R UE Baseline: painful Goal status: INITIAL  4.  Patient will be able to demonstrate at least 75 degrees of right shoulder IR at 90 abd  Baseline: see above Goal status: INITIAL   PLAN:  PT FREQUENCY:1- 2x/week for a total of 16 visits over 12 week certification period  PT DURATION: 12 weeks  PLANNED INTERVENTIONS: 97110-Therapeutic exercises, 97530- Therapeutic activity, 97112- Neuromuscular re-education, 97535- Self Care, 16109- Manual therapy, [phone]- Gait training, [phone]- Orthotic Fit/training, [phone]- Canalith  repositioning, [id number]- Aquatic Therapy, 97014- Electrical stimulation (unattended), [phone]- Ionotophoresis 4mg /ml Dexamethasone, Patient/Family education, Balance training, Stair training, Taping, Dry Needling, Joint mobilization, Joint manipulation, Spinal manipulation, Spinal mobilization, Cryotherapy, and Moist heat  PLAN FOR NEXT SESSION: t spine mobilization, joint mobilization, STM, scapular movement and strengthening - ROM/MMT, thoracic mobility   8:48 AM, 06/01/23 Tereasa Coop, DPT Physical Therapy with [facility]

## 2023-06-03 ENCOUNTER — Encounter: Payer: 59 | Admitting: Physical Therapy

## 2023-06-08 ENCOUNTER — Telehealth: Payer: Self-pay | Admitting: Pharmacist

## 2023-06-08 ENCOUNTER — Encounter: Payer: Self-pay | Admitting: Physical Therapy

## 2023-06-08 ENCOUNTER — Ambulatory Visit (INDEPENDENT_AMBULATORY_CARE_PROVIDER_SITE_OTHER): Payer: 59 | Admitting: Physical Therapy

## 2023-06-08 DIAGNOSIS — M6281 Muscle weakness (generalized): Secondary | ICD-10-CM

## 2023-06-08 DIAGNOSIS — M25511 Pain in right shoulder: Secondary | ICD-10-CM

## 2023-06-08 NOTE — Telephone Encounter (Signed)
Submitted a Prior Authorization RENEWAL request to [facility] for [facility] via CoverMyMeds. Will update once we receive a response.  Key: [id number]  Chesley Mires, PharmD, MPH, BCPS, CPP Clinical Pharmacist (Rheumatology and Pulmonology)

## 2023-06-08 NOTE — Therapy (Signed)
OUTPATIENT PHYSICAL THERAPY SHOULDER TREATMENT   Patient Name: Jeffrey Klein MRN: [id number] DOB:09/16/1960, 62 y.o., male Today's Date: 06/08/2023  END OF SESSION:  PT End of Session - 06/08/23 0752     Visit Number 5    Number of Visits 16    Date for PT Re-Evaluation 08/13/23    Authorization Type U[employer]    PT Start Time 0800    PT Stop Time 0840    PT Time Calculation (min) 40 min    Activity Tolerance Patient tolerated treatment well    Behavior During Therapy [facility] for tasks assessed/performed              Past Medical History:  Diagnosis Date   Diverticulosis    diverticulitis in 2018, no recurrence   GERD (gastroesophageal reflux disease)    High cholesterol    Psoriasis    Psoriatic arthritis [facility])    Past Surgical History:  Procedure Laterality Date   COLONOSCOPY  08/29/2016   [facility]. Dr. Louellen Molder: Single 5 mm transverse colon adenoma, sigmoid diverticulosis.  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   Gout 11/18/2019   Dyslipidemia 11/18/2019   Low testosterone 11/18/2019   Diverticulosis 11/18/2019   Psoriatic arthritis (HCC) 11/18/2019    PCP: Jacquiline Doe, MD  REFERRING PROVIDER: Richardean Sale, DO  REFERRING DIAG:  [phone] (ICD-10-CM) - Chronic right shoulder pain  S42.254D (ICD-10-CM) - Closed nondisplaced fracture of greater tuberosity of right humerus with routine  healing, subsequent encounter  M67.911 (ICD-10-CM) - Tendinopathy of right rotator cuff    THERAPY DIAG:  Acute pain of right shoulder  Muscle weakness (generalized)  Rationale for Evaluation and Treatment: Rehabilitation  ONSET DATE: 01/08/23  SUBJECTIVE:                                                                                                                                                                                      SUBJECTIVE STATEMENT: 06/08/2023 States his hip is still angry at night. Shoulder is getting better. States he was  busy this weekend so he didn't do much of his exercises. Pain is worse after driving.    EVAL: Patient reports that he fell on July 4 he was not paying attention and then fell on the road while stepping off of a curve onto his right side.  Reports he was bruised and had pain all over his right side and imaging demonstrated right humeral fracture.  He was having a lot of range of motion limitations and pain but since the injury his pain has improved as well as his range of motion.  He has gotten 2 cortisone injections which he feel has also helped.  He also saw a chiropractor for his neck pain but feels that his neck symptoms resolved on their own.  Reports he has difficulties reaching behind his back and reaching up overhead especially with weights or objects.  Patient is very active and is currently performing home renovations and would like to be able to perform all activities without pain or limitations. Reports no numbness or tingling in his hands or arms anymore but did have some initially after the injury. Hand dominance: Ambidextrous  PERTINENT HISTORY: Psoriatic arthritis, gout, hx of right tibia fracture, general arthritis  PAIN:  Are you having pain? Yes: NPRS scale: 4/10 Pain location: hips Pain description: achy, clicking  Aggravating factors: reaching behind back Relieving factors: rest, avoiding motion, ice  PRECAUTIONS:  None  RED FLAGS: None   WEIGHT BEARING RESTRICTIONS: No  FALLS:  Has patient fallen in last 6 months? Yes. Number of falls 1     OCCUPATION: Pensions consultant - still works  PLOF: Independent  PATIENT GOALS:to have less pain  NEXT MD VISIT:   OBJECTIVE:  Note: Objective measures were completed at Evaluation unless otherwise noted.  DIAGNOSTIC FINDINGS:  R Shoulder xray 01/15/23 IMPRESSION: 1. Likely subtle nondisplaced fracture through the greater tuberosity of the proximal right humerus. Coupled with the findings of an ossific density at the inferior margin of the glenoid, this could reflect sequela from transient glenohumeral dislocation. Alignment on today's exam is anatomic.  PATIENT SURVEYS:  FOTO 65%  COGNITION: Overall cognitive status: Within functional limits for tasks assessed     SENSATION: WFL  POSTURE: Right forward shoulder, reduced anterior pelvic tilt   Cervical AROM:    EVAL     Flexion  WFL     Extension  WFL     R ROT 60      L ROT  60     R SB  25    L SB 25     * Pain   (Blank rows = not tested)   UE Measurements Upper Extremity Right EVAL Left EVAL   A/PROM MMT A/PROM MMT  Shoulder Flexion 160* 3+* 160 4  Shoulder Extension      Shoulder Abduction WFL*^ 4* WFL 4+  Shoulder Adduction      Shoulder Internal Rotation/ At 90 abd Reaches to right lower ribs*/60* 4+ Reaches to T6SP/90 4+  Shoulder External Rotation Reaches tot T 4 SP/90 3+* Reaches to T4 SP/90 4-  Elbow Flexion      Elbow Extension      Wrist Flexion      Wrist Extension      Wrist Supination      Wrist Pronation      Wrist Ulnar Deviation      Wrist Radial Deviation      Grip Strength NA  NA     (Blank rows =  not tested)   * pain   ^ shrugs     JOINT MOBILITY TESTING:  hypomobility noted in right shoulder joint in all directions  PALPATION:  Increased resting tone in right pec/deltoid, RC   TODAY'S TREATMENT:                                                                                                                                          DATE:  06/08/2023  Therapeutic Exercise:  Aerobic: Supine:  Quad: + and retraction - 2 minutes  Prone: star 3x3 B, shoulder extension R 3x6    Seated: scapular protraction/retraction PT assist 3 minutes, scapular protraction in elbow bent position 3x12 B  Standing: shoulder extension with strap x15 5" holds B, horizontal shoulder abd with blue band and wall support x15 5" holds   Neuromuscular Re-education:  Manual Therapy: Therapeutic Activity: Self Care: Trigger Point Dry Needling:  Modalities:     PATIENT EDUCATION:  Education details: on HEP and  anatomy and use of lumbar roll with sitting in car Person educated: Patient Education method: Explanation, Demonstration, and Handouts Education comprehension: verbalized understanding   HOME EXERCISE PROGRAM: [id number] Sit bone education  ASSESSMENT:  CLINICAL IMPRESSION: 06/08/2023 Continued to progress exercises. Tactile and verbal cues throughout for form. Fatigue noted but no pain end of session. Overall doing well and will continue to benefit from skilled PT at this time.   EVAL: Patient presents to physical therapy with complaints of right shoulder pain and stiffness after fall in July of this year and subsequent fracture.  Overall patient is doing very well but limited especially in internal rotation and functional activities reaching behind his back.  Educated patient current presentation as well as plan moving forward and responded well to initial treatment session demonstrating improved range of motion after exercises.  Patient would greatly benefit from skilled PT to reduce further risk of injury and improve functional mobility at home and in the community.  OBJECTIVE IMPAIRMENTS: decreased activity tolerance, decreased ROM, decreased strength, improper body mechanics, postural dysfunction, and pain.   ACTIVITY LIMITATIONS:  lifting, bathing, dressing, and reach over head  PARTICIPATION LIMITATIONS: cleaning and yard work  PERSONAL FACTORS: Age, Fitness, and Time since onset of injury/illness/exacerbation are also affecting patient's functional outcome.   REHAB POTENTIAL: Good  CLINICAL DECISION MAKING: Stable/uncomplicated  EVALUATION COMPLEXITY: Low   GOALS: Goals reviewed with patient? yes  SHORT TERM GOALS: Target date: 07/02/2023   Patient will be independent in self management strategies to improve quality of life and functional outcomes. Baseline: New Program Goal status: INITIAL  2.  Patient will report at least 50% improvement in overall symptoms and/or function to demonstrate improved functional mobility Baseline: 0% better Goal status: INITIAL  3.  Patient will be able to wash back with right hand without difficulties to demonstrate improved overall function Baseline: unable with right Goal status: INITIAL  LONG TERM GOALS: Target date: 08/13/2023    Patient will report at least 75% improvement in overall symptoms and/or function to demonstrate improved functional mobility Baseline: 0% better Goal status: INITIAL  2.  Patient will improve score on FOTO outcomes measure to projected score to demonstrate overall improved function and QOL Baseline: see above Goal status: INITIAL  3.  Patient will demonstrate painfree MMT in R UE Baseline: painful Goal status: INITIAL  4.  Patient will be able to demonstrate at least 75 degrees of right shoulder IR at 90 abd  Baseline: see above Goal status: INITIAL   PLAN:  PT FREQUENCY:1- 2x/week for a total of 16 visits over 12 week certification period  PT DURATION: 12 weeks  PLANNED INTERVENTIONS: 97110-Therapeutic exercises, 97530- Therapeutic activity, 97112- Neuromuscular re-education, 97535- Self Care, 46962- Manual therapy, [phone]- Gait training, [phone]- Orthotic Fit/training, [phone]- Canalith repositioning, [id number]- Aquatic  Therapy, 97014- Electrical stimulation (unattended), [phone]- Ionotophoresis 4mg /ml Dexamethasone, Patient/Family education, Balance training, Stair training, Taping, Dry Needling, Joint mobilization, Joint manipulation, Spinal manipulation, Spinal mobilization, Cryotherapy, and Moist heat  PLAN FOR NEXT SESSION: t spine mobilization, joint mobilization, STM, scapular movement and strengthening - ROM/MMT, thoracic mobility   8:44 AM, 06/08/23 Tereasa Coop, DPT Physical Therapy with [facility]

## 2023-06-10 NOTE — Telephone Encounter (Signed)
Received fax from [employer]. Patient's Alt[employer] Oiler is on the plan's listed of covered drugs, and prior authorization is not required.  Phone: [phone]  Chesley Mires, PharmD, MPH, BCPS, CPP Clinical Pharmacist (Rheumatology and Pulmonology)

## 2023-06-10 NOTE — Progress Notes (Unsigned)
    Aleen Sells D.[facility] [address] [place] [address] Phone: [phone]   Assessment and Plan:     There are no diagnoses linked to this encounter.  ***   Pertinent previous records reviewed include ***    Follow Up: ***     Subjective:   I, Climmie Buelow, am serving as a Neurosurgeon for Doctor Richardean Sale   Chief Complaint: right shoulder, elbow and knee pain    HPI:    01/15/2023 Patient is a 62 year old male complaining of right shoulder,elbow and knee pain. Patient states that he had a fall July 4th he fell on to the road. He landed on his right side. He had some bruising and swelling. He has decreased ROM due to swelling. No numbness and tingling now, decreased grip strength, he states he feels he is fatigued. He has some scraps and cuts. He has hx of knee arthritis and he notes that he landed on his knee no antalgic gait. Aleve for the pain and he thinks it helps he doesn't endorse to much pain , just pain from the fall    02/12/2023 Patient states that he is improving but still has some limitations. Thinks he either has whiplash or a dry wall screw in his neck he has decreased ROM.    03/12/2023 Patient states that he has been improving, he is just surprised it is taking so long to heal    05/14/2023 Patient states that he has slow progress. He still has stiffness that is now on the lateral shoulder . He is not able to do Apleys back scratch  06/11/2023 Patient states   Relevant Historical Information: Psoriatic arthritis, gout  Additional pertinent review of systems negative.   Current Outpatient Medications:    allopurinol (ZYLOPRIM) 100 MG tablet, Take 1 tablet (100 mg total) by mouth daily., Disp: 90 tablet, Rfl: 3   atorvastatin (LIPITOR) 20 MG tablet, Take 1 tablet (20 mg total) by mouth daily., Disp: 90 tablet, Rfl: 3   betamethasone valerate (VALISONE) 0.1 % cream, Apply topically once daily as needed, Disp: 45  g, Rfl: 0   meloxicam (MOBIC) 15 MG tablet, Take 1 tablet (15 mg total) by mouth daily., Disp: 30 tablet, Rfl: 0   Misc Natural Products (JOINT SUPPORT PO), Take 1 tablet by mouth daily., Disp: , Rfl:    Omega-3 Fatty Acids (FISH OIL PO), Take by mouth daily., Disp: , Rfl:    rosuvastatin (CRESTOR) 10 MG tablet, Take 10 mg by mouth daily., Disp: , Rfl:    TALTZ 80 MG/ML injection, INJECT 1 SYRINGE SUBCUTANEOUSLY  EVERY 4 WEEKS, Disp: 1 mL, Rfl: 2   testosterone (TESTIM) 50 MG/5GM (1%) GEL, Place 5 g onto the skin daily., Disp: 150 g, Rfl: 5   Testosterone 12.5 MG/ACT (1%) GEL, APPLY FOUR PUMPS INTO THE SKIN DAILY, Disp: 150 g, Rfl: 5   VITAMIN D PO, Take by mouth daily., Disp: , Rfl:    Objective:     There were no vitals filed for this visit.    There is no height or weight on file to calculate BMI.    Physical Exam:    ***   Electronically signed by:  Aleen Sells D[facility] 7:25 AM 06/10/23

## 2023-06-11 ENCOUNTER — Encounter: Payer: 59 | Admitting: Physical Therapy

## 2023-06-11 ENCOUNTER — Ambulatory Visit: Payer: 59 | Admitting: Sports Medicine

## 2023-06-11 VITALS — BP 122/84 | HR 87 | Ht 70.0 in | Wt 220.0 lb

## 2023-06-11 DIAGNOSIS — S42254D Nondisplaced fracture of greater tuberosity of right humerus, subsequent encounter for fracture with routine healing: Secondary | ICD-10-CM | POA: Diagnosis not present

## 2023-06-11 DIAGNOSIS — M67911 Unspecified disorder of synovium and tendon, right shoulder: Secondary | ICD-10-CM | POA: Diagnosis not present

## 2023-06-11 DIAGNOSIS — G8929 Other chronic pain: Secondary | ICD-10-CM | POA: Diagnosis not present

## 2023-06-11 DIAGNOSIS — M25511 Pain in right shoulder: Secondary | ICD-10-CM

## 2023-06-11 MED ORDER — MELOXICAM 15 MG PO TABS
15.0000 mg | ORAL_TABLET | Freq: Every day | ORAL | 0 refills | Status: DC | PRN
Start: 2023-06-11 — End: 2023-08-04

## 2023-06-15 ENCOUNTER — Ambulatory Visit (INDEPENDENT_AMBULATORY_CARE_PROVIDER_SITE_OTHER): Payer: 59 | Admitting: Physical Therapy

## 2023-06-15 ENCOUNTER — Encounter: Payer: Self-pay | Admitting: Physical Therapy

## 2023-06-15 DIAGNOSIS — M25511 Pain in right shoulder: Secondary | ICD-10-CM | POA: Diagnosis not present

## 2023-06-15 DIAGNOSIS — M6281 Muscle weakness (generalized): Secondary | ICD-10-CM | POA: Diagnosis not present

## 2023-06-15 NOTE — Therapy (Signed)
OUTPATIENT PHYSICAL THERAPY SHOULDER TREATMENT Progress Note Reporting Period 05/21/23 to 06/15/23  See note below for Objective Data and Assessment of Progress/Goals.      Patient Name: Jeffrey Klein MRN: [id number] DOB:12-30-1960, 62 y.o., male Today's Date: 06/15/2023  END OF SESSION:  PT End of Session - 06/15/23 0802     Visit Number 6    Number of Visits 16    Date for PT Re-Evaluation 08/13/23    Authorization Type [employer]    Progress Note Due on Visit 16    PT Start Time 0803    PT Stop Time 0845    PT Time Calculation (min) 42 min    Activity Tolerance Patient tolerated treatment well    Behavior During Therapy [facility] for tasks assessed/performed              Past Medical History:  Diagnosis Date   Diverticulosis    diverticulitis in 2018, no recurrence   GERD (gastroesophageal reflux disease)    High cholesterol    Psoriasis    Psoriatic arthritis [facility])    Past Surgical History:  Procedure Laterality Date   COLONOSCOPY  08/29/2016   [facility]. Dr. Louellen Molder: Single 5 mm transverse colon adenoma, sigmoid diverticulosis.  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   Gout 11/18/2019   Dyslipidemia 11/18/2019   Low testosterone 11/18/2019   Diverticulosis 11/18/2019   Psoriatic arthritis (HCC) 11/18/2019    PCP: Jacquiline Doe, MD  REFERRING PROVIDER: Richardean Sale, DO  REFERRING DIAG:   [phone] (ICD-10-CM) - Chronic right shoulder pain  S42.254D (ICD-10-CM) - Closed nondisplaced fracture of greater tuberosity of right humerus with routine healing, subsequent encounter  M67.911 (ICD-10-CM) - Tendinopathy of right rotator cuff    THERAPY DIAG:  Acute pain of right shoulder  Muscle weakness (generalized)  Rationale for Evaluation and Treatment: Rehabilitation  ONSET DATE: 01/08/23  SUBJECTIVE:  SUBJECTIVE STATEMENT: 06/15/2023 States he saw the MD about his hip but was feel ing better. States he has been doing more of his hip exercises as it bothers him more than the shoulder. Reports overall he feels about 90% better since the start of PT.   EVAL: Patient reports that he fell on July 4 he was not paying attention and then fell on the road while stepping off of a curve onto his right side.  Reports he was bruised and had pain all over his right side and imaging demonstrated right humeral fracture.  He was having a lot of range of motion limitations and pain but since the injury his pain has improved as well as his range of motion.  He has gotten 2 cortisone injections which he feel has also helped.  He also saw a chiropractor for his neck pain but feels that his neck symptoms resolved on their own.  Reports he has difficulties reaching behind his back and reaching up overhead especially with weights or objects.  Patient is very active and is currently performing home renovations and would like to be able to perform all activities without pain or limitations. Reports no numbness or tingling in his hands or arms anymore but did have some initially after the injury. Hand dominance: Ambidextrous  PERTINENT HISTORY: Psoriatic arthritis, gout, hx of right tibia fracture, general arthritis  PAIN:   Are you having pain? Yes: NPRS scale: 2.5/10 Pain location: hips Pain description: twinging Aggravating factors: reaching behind back Relieving factors: rest, avoiding motion, ice  PRECAUTIONS: None  RED FLAGS: None   WEIGHT BEARING RESTRICTIONS: No  FALLS:  Has patient fallen in last 6 months? Yes. Number of falls 1     OCCUPATION: Pensions consultant - still works  PLOF: Independent  PATIENT GOALS:to have less pain  NEXT MD VISIT:   OBJECTIVE:  Note: Objective measures were completed at Evaluation unless otherwise noted.  DIAGNOSTIC FINDINGS:  R Shoulder xray 01/15/23 IMPRESSION: 1. Likely subtle nondisplaced fracture through the greater tuberosity of the proximal right humerus. Coupled with the findings of an ossific density at the inferior margin of the glenoid, this could reflect sequela from transient glenohumeral dislocation. Alignment on today's exam is anatomic.  PATIENT SURVEYS:  FOTO 65% , 12/9 75% Predicted 72%   COGNITION: Overall cognitive status: Within functional limits for tasks assessed     SENSATION: WFL  POSTURE: Right forward shoulder, reduced anterior pelvic tilt   UE Measurements Upper Extremity Right 12/9 Left 12/9   A/PROM MMT A/PROM MMT  Shoulder Flexion 160/WFL 4* 160/WFL 4+  Shoulder Extension      Shoulder Abduction WFL^ 4+* WFL 4+*  Shoulder Adduction      Shoulder Internal Rotation/ At 90 abd Reaches to T12 SP**/80 4+ Reaches to T6SP/90 4+  Shoulder External Rotation Reaches to T4 SP/90 4-* Reaches to T4 SP/90 4+  Elbow Flexion      Elbow Extension      Wrist Flexion      Wrist Extension      Wrist Supination      Wrist Pronation      Wrist Ulnar Deviation      Wrist Radial Deviation      Grip Strength NA  NA     (Blank rows = not tested)   * discomfort **pain   ^ shrugs   TODAY'S TREATMENT:  DATE:  06/15/2023  Therapeutic Exercise:  Objective measures updated Supine: 90 abd IR/ER R 4 minutes,  Prone: 90 abd IR ER R 4 minutes     Neuromuscular Re-education:  Manual Therapy: Therapeutic Activity: Self Care: Trigger Point Dry Needling:  Modalities:     PATIENT EDUCATION:  Education details: on HEP, FOTO score, goals, progress, POC Person educated: Patient Education method: Explanation, Demonstration, and Handouts Education comprehension: verbalized understanding   HOME EXERCISE PROGRAM: [id number] Sit bone education  ASSESSMENT:  CLINICAL IMPRESSION: 06/15/2023  Progress note performed on this date. Overall patient doing well and has met all but 1 short term goal and 1 long term goal. Reviewed HEP and progress with patient continuing PT at reduced frequency. Will continue with current POC as tolerated. No pain noted end of session.   EVAL: Patient presents to physical therapy with complaints of right shoulder pain and stiffness after fall in July of this year and subsequent fracture.  Overall patient is doing very well but limited especially in internal rotation and functional activities reaching behind his back.  Educated patient current presentation as well as plan moving forward and responded well to initial treatment session demonstrating improved range of motion after exercises.  Patient would greatly benefit from skilled PT to reduce further risk of injury and improve functional mobility at home and in the community.  OBJECTIVE IMPAIRMENTS: decreased activity tolerance, decreased ROM, decreased strength, improper body mechanics, postural dysfunction, and pain.   ACTIVITY LIMITATIONS: lifting, bathing, dressing, and reach over head  PARTICIPATION LIMITATIONS: cleaning and yard work  PERSONAL FACTORS: Age, Fitness, and Time since onset of injury/illness/exacerbation are also affecting patient's functional outcome.   REHAB POTENTIAL: Good  CLINICAL  DECISION MAKING: Stable/uncomplicated  EVALUATION COMPLEXITY: Low   GOALS: Goals reviewed with patient? yes  SHORT TERM GOALS: Target date: 07/02/2023   Patient will be independent in self management strategies to improve quality of life and functional outcomes. Baseline: New Program Goal status: PROGRESSING  2.  Patient will report at least 50% improvement in overall symptoms and/or function to demonstrate improved functional mobility Baseline: 0% better Goal status: MET  3.  Patient will be able to wash back with right hand without difficulties to demonstrate improved overall function Baseline: unable with right Goal status: MET       LONG TERM GOALS: Target date: 08/13/2023    Patient will report at least 75% improvement in overall symptoms and/or function to demonstrate improved functional mobility Baseline: 0% better Goal status: MET  2.  Patient will improve score on FOTO outcomes measure to projected score to demonstrate overall improved function and QOL Baseline: see above Goal status: MET  3.  Patient will demonstrate painfree MMT in R UE Baseline: painful Goal status: PROGRESSING  4.  Patient will be able to demonstrate at least 75 degrees of right shoulder IR at 90 abd  Baseline: see above Goal status: MET   PLAN:  PT FREQUENCY:1- 2x/week for a total of 16 visits over 12 week certification period  PT DURATION: 12 weeks  PLANNED INTERVENTIONS: 97110-Therapeutic exercises, 97530- Therapeutic activity, 97112- Neuromuscular re-education, 97535- Self Care, 41324- Manual therapy, [id number]- Gait training, [phone]- Orthotic Fit/training, [phone]- Canalith repositioning, [id number]- Aquatic Therapy, 97014- Electrical stimulation (unattended), [phone]- Ionotophoresis 4mg /ml Dexamethasone, Patient/Family education, Balance training, Stair training, Taping, Dry Needling, Joint mobilization, Joint manipulation, Spinal manipulation, Spinal mobilization, Cryotherapy, and Moist  heat  PLAN FOR NEXT SESSION: t spine mobilization, joint mobilization, STM, scapular movement and strengthening - ROM/MMT, thoracic  mobility   9:08 AM, 06/15/23 Tereasa Coop, DPT Physical Therapy with [facility]

## 2023-06-17 ENCOUNTER — Other Ambulatory Visit: Payer: Self-pay | Admitting: Rheumatology

## 2023-06-17 ENCOUNTER — Encounter: Payer: Self-pay | Admitting: Physical Therapy

## 2023-06-17 ENCOUNTER — Ambulatory Visit (INDEPENDENT_AMBULATORY_CARE_PROVIDER_SITE_OTHER): Payer: 59 | Admitting: Physical Therapy

## 2023-06-17 DIAGNOSIS — M25511 Pain in right shoulder: Secondary | ICD-10-CM | POA: Diagnosis not present

## 2023-06-17 DIAGNOSIS — M6281 Muscle weakness (generalized): Secondary | ICD-10-CM

## 2023-06-17 DIAGNOSIS — L409 Psoriasis, unspecified: Secondary | ICD-10-CM

## 2023-06-17 DIAGNOSIS — L4050 Arthropathic psoriasis, unspecified: Secondary | ICD-10-CM

## 2023-06-17 NOTE — Therapy (Addendum)
 OUTPATIENT PHYSICAL THERAPY SHOULDER TREATMENT  PHYSICAL THERAPY DISCHARGE SUMMARY  Visits from Start of Care: 7  Current functional level related to goals / functional outcomes: Could not reassess due to unplanned discharged   Remaining deficits: Could not reassess due to unplanned discharged   Education / Equipment: Could not reassess due to unplanned discharged   Patient agrees to discharge. Patient goals were partially met. Patient is being discharged due to not returning since the last visit.  2:42 PM, 09/29/23 Tereasa Coop, DPT Physical Therapy with [facility]     Patient Name: Deuce Paternoster MRN: [id number] DOB:1960/12/07, 62 y.o., male Today's Date: 06/17/2023  END OF SESSION:  PT End of Session - 06/17/23 0815     Visit Number 7    Number of Visits 16    Date for PT Re-Evaluation 08/13/23    Authorization Type [employer]    Progress Note Due on Visit 16    PT Start Time 0815   late to check in   PT Stop Time 0853    PT Time Calculation (min) 38 min    Activity Tolerance Patient tolerated treatment well    Behavior During Therapy [facility] for tasks assessed/performed              Past Medical History:  Diagnosis Date   Diverticulosis    diverticulitis in 2018, no recurrence   GERD (gastroesophageal reflux disease)    High cholesterol    Psoriasis    Psoriatic arthritis [facility])    Past Surgical History:  Procedure Laterality Date   COLONOSCOPY  08/29/2016   [facility]. Dr. Louellen Molder: Single 5 mm transverse colon adenoma, sigmoid diverticulosis.  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   Gout 11/18/2019   Dyslipidemia 11/18/2019   Low testosterone 11/18/2019   Diverticulosis 11/18/2019   Psoriatic arthritis (HCC) 11/18/2019    PCP: Jacquiline Doe, MD  REFERRING PROVIDER: Richardean Sale, DO  REFERRING DIAG:  [phone] (ICD-10-CM) - Chronic right shoulder pain  S42.254D (ICD-10-CM) - Closed nondisplaced fracture of greater tuberosity of right humerus with routine healing, subsequent encounter  M67.911 (ICD-10-CM) - Tendinopathy of right rotator cuff    THERAPY DIAG:  Acute pain of right shoulder  Muscle weakness (generalized)  Rationale for Evaluation and Treatment: Rehabilitation  ONSET DATE: 01/08/23  SUBJECTIVE:  SUBJECTIVE STATEMENT: 06/17/2023 States he feels good in his shoulder.   EVAL: Patient reports that he fell on July 4 he was not paying attention and then fell on the road while stepping off of a curve onto his right side.  Reports he was bruised and had pain all over his right side and imaging demonstrated right humeral fracture.  He was having a lot of range of motion limitations and pain but since the injury his pain has improved as well as his range of motion.  He has gotten 2 cortisone injections which he feel has also helped.  He also saw a chiropractor for his neck pain but feels that his neck symptoms resolved on their own.  Reports he has difficulties reaching behind his back and reaching up overhead especially with weights or objects.  Patient is very active and is currently performing home renovations and would like to be able to perform all activities without pain or  limitations. Reports no numbness or tingling in his hands or arms anymore but did have some initially after the injury. Hand dominance: Ambidextrous  PERTINENT HISTORY: Psoriatic arthritis, gout, hx of right tibia fracture, general arthritis  PAIN:  Are you having pain? Yes: NPRS scale: 0/10 Pain location: hips Pain description: twinging Aggravating factors: reaching behind back Relieving factors: rest, avoiding motion, ice  PRECAUTIONS: None  RED FLAGS: None   WEIGHT BEARING RESTRICTIONS: No  FALLS:  Has patient fallen in last 6 months? Yes. Number of falls 1     OCCUPATION: Pensions consultant - still works  PLOF: Independent  PATIENT GOALS:to have less pain  NEXT MD VISIT:   OBJECTIVE:  Note: Objective measures were completed at Evaluation unless otherwise noted.  DIAGNOSTIC FINDINGS:  R Shoulder xray 01/15/23 IMPRESSION: 1. Likely subtle nondisplaced fracture through the greater tuberosity of the proximal right humerus. Coupled with the findings of an ossific density at the inferior margin of the glenoid, this could reflect sequela from transient glenohumeral dislocation. Alignment on today's exam is anatomic.  PATIENT SURVEYS:  FOTO 65% , 12/9 75% Predicted 72%   COGNITION: Overall cognitive status: Within functional limits for tasks assessed     SENSATION: WFL  POSTURE: Right forward shoulder, reduced anterior pelvic tilt   UE Measurements Upper Extremity Right 12/9 Left 12/9   A/PROM MMT A/PROM MMT  Shoulder Flexion 160/WFL 4* 160/WFL 4+  Shoulder Extension      Shoulder Abduction WFL^ 4+* WFL 4+*  Shoulder Adduction      Shoulder Internal Rotation/ At 90 abd Reaches to T12 SP**/80 4+ Reaches to T6SP/90 4+  Shoulder External Rotation Reaches to T4 SP/90 4-* Reaches to T4 SP/90 4+  Elbow Flexion      Elbow Extension      Wrist Flexion      Wrist Extension      Wrist Supination      Wrist Pronation      Wrist Ulnar Deviation      Wrist  Radial Deviation      Grip Strength NA  NA     (Blank rows = not tested)   * discomfort **pain   ^ shrugs   TODAY'S TREATMENT:  DATE:  06/17/2023  Therapeutic Exercise:   Standing: x15 5" holds shoulder extension with towel, PNF D1 D1 with red band x12 R then with blue band x20 Each then D2 x20 R Blue band, lat pull down blue band x30, shoulder extension with blue band  2x15 5" holds B, Shoulder ER blue band x10 10" holds R   Neuromuscular Re-education:  Manual Therapy: Therapeutic Activity: Self Care: Trigger Point Dry Needling:  Modalities:     PATIENT EDUCATION:  Education details: on HEP Person educated: Patient Education method: Explanation, Facilities manager, and Handouts Education comprehension: verbalized understanding   HOME EXERCISE PROGRAM: [id number] Sit bone education  ASSESSMENT:  CLINICAL IMPRESSION: 06/17/2023 Session focused on strengthening with theraband. Tolerated exercises well. Fatigue noted with exercises but no pain end of session. Added new band exercises to HEP and provided patient with blue TB for HEP adherence. Overall patient doing well and will continue to benefit from skilled PT at this time.   EVAL: Patient presents to physical therapy with complaints of right shoulder pain and stiffness after fall in July of this year and subsequent fracture.  Overall patient is doing very well but limited especially in internal rotation and functional activities reaching behind his back.  Educated patient current presentation as well as plan moving forward and responded well to initial treatment session demonstrating improved range of motion after exercises.  Patient would greatly benefit from skilled PT to reduce further risk of injury and improve functional mobility at home and in the community.  OBJECTIVE IMPAIRMENTS:  decreased activity tolerance, decreased ROM, decreased strength, improper body mechanics, postural dysfunction, and pain.   ACTIVITY LIMITATIONS: lifting, bathing, dressing, and reach over head  PARTICIPATION LIMITATIONS: cleaning and yard work  PERSONAL FACTORS: Age, Fitness, and Time since onset of injury/illness/exacerbation are also affecting patient's functional outcome.   REHAB POTENTIAL: Good  CLINICAL DECISION MAKING: Stable/uncomplicated  EVALUATION COMPLEXITY: Low   GOALS: Goals reviewed with patient? yes  SHORT TERM GOALS: Target date: 07/02/2023   Patient will be independent in self management strategies to improve quality of life and functional outcomes. Baseline: New Program Goal status: PROGRESSING  2.  Patient will report at least 50% improvement in overall symptoms and/or function to demonstrate improved functional mobility Baseline: 0% better Goal status: MET  3.  Patient will be able to wash back with right hand without difficulties to demonstrate improved overall function Baseline: unable with right Goal status: MET       LONG TERM GOALS: Target date: 08/13/2023    Patient will report at least 75% improvement in overall symptoms and/or function to demonstrate improved functional mobility Baseline: 0% better Goal status: MET  2.  Patient will improve score on FOTO outcomes measure to projected score to demonstrate overall improved function and QOL Baseline: see above Goal status: MET  3.  Patient will demonstrate painfree MMT in R UE Baseline: painful Goal status: PROGRESSING  4.  Patient will be able to demonstrate at least 75 degrees of right shoulder IR at 90 abd  Baseline: see above Goal status: MET   PLAN:  PT FREQUENCY:1- 2x/week for a total of 16 visits over 12 week certification period  PT DURATION: 12 weeks  PLANNED INTERVENTIONS: 97110-Therapeutic exercises, 97530- Therapeutic activity, 97112- Neuromuscular re-education, 97535-  Self Care, 13086- Manual therapy, [phone]- Gait training, [phone]- Orthotic Fit/training, [phone]- Canalith repositioning, [id number]- Aquatic Therapy, 97014- Electrical stimulation (unattended), [phone]- Ionotophoresis 4mg /ml Dexamethasone, Patient/Family education, Balance training, Stair training, Taping, Dry Needling, Joint mobilization, Joint  manipulation, Spinal manipulation, Spinal mobilization, Cryotherapy, and Moist heat  PLAN FOR NEXT SESSION: t spine mobilization, joint mobilization, STM, scapular movement and strengthening - ROM/MMT, thoracic mobility   9:00 AM, 06/17/23 Tereasa Coop, DPT Physical Therapy with [facility]

## 2023-06-25 ENCOUNTER — Encounter: Payer: 59 | Admitting: Physical Therapy

## 2023-07-02 ENCOUNTER — Encounter: Payer: 59 | Admitting: Physical Therapy

## 2023-07-07 ENCOUNTER — Encounter: Payer: 59 | Admitting: Physical Therapy

## 2023-07-14 ENCOUNTER — Encounter: Payer: 59 | Admitting: Physical Therapy

## 2023-07-14 ENCOUNTER — Other Ambulatory Visit: Payer: Self-pay | Admitting: Sports Medicine

## 2023-07-14 DIAGNOSIS — G8929 Other chronic pain: Secondary | ICD-10-CM

## 2023-07-23 ENCOUNTER — Other Ambulatory Visit: Payer: Self-pay | Admitting: *Deleted

## 2023-07-23 DIAGNOSIS — Z79899 Other long term (current) drug therapy: Secondary | ICD-10-CM

## 2023-07-24 LAB — CBC WITH DIFFERENTIAL/PLATELET
Absolute Lymphocytes: 1684 {cells}/uL (ref 850–3900)
Absolute Monocytes: 531 {cells}/uL (ref 200–950)
Basophils Absolute: 43 {cells}/uL (ref 0–200)
Basophils Relative: 0.7 %
Eosinophils Absolute: 214 {cells}/uL (ref 15–500)
Eosinophils Relative: 3.5 %
HCT: 45.8 % (ref 38.5–50.0)
Hemoglobin: 15.1 g/dL (ref 13.2–17.1)
MCH: 29.9 pg (ref 27.0–33.0)
MCHC: 33 g/dL (ref 32.0–36.0)
MCV: 90.7 fL (ref 80.0–100.0)
MPV: 9.6 fL (ref 7.5–12.5)
Monocytes Relative: 8.7 %
Neutro Abs: 3630 {cells}/uL (ref 1500–7800)
Neutrophils Relative %: 59.5 %
Platelets: 332 10*3/uL (ref 140–400)
RBC: 5.05 10*6/uL (ref 4.20–5.80)
RDW: 13.4 % (ref 11.0–15.0)
Total Lymphocyte: 27.6 %
WBC: 6.1 10*3/uL (ref 3.8–10.8)

## 2023-07-24 LAB — COMPLETE METABOLIC PANEL WITH GFR
AG Ratio: 1.9 (calc) (ref 1.0–2.5)
ALT: 34 U/L (ref 9–46)
AST: 26 U/L (ref 10–35)
Albumin: 4.5 g/dL (ref 3.6–5.1)
Alkaline phosphatase (APISO): 35 U/L (ref 35–144)
BUN: 16 mg/dL (ref 7–25)
CO2: 30 mmol/L (ref 20–32)
Calcium: 9.8 mg/dL (ref 8.6–10.3)
Chloride: 103 mmol/L (ref 98–110)
Creat: 1.03 mg/dL (ref 0.70–1.35)
Globulin: 2.4 g/dL (ref 1.9–3.7)
Glucose, Bld: 106 mg/dL — ABNORMAL HIGH (ref 65–99)
Potassium: 4.4 mmol/L (ref 3.5–5.3)
Sodium: 141 mmol/L (ref 135–146)
Total Bilirubin: 0.6 mg/dL (ref 0.2–1.2)
Total Protein: 6.9 g/dL (ref 6.1–8.1)
eGFR: 81 mL/min/{1.73_m2} (ref >=60)

## 2023-07-24 NOTE — Progress Notes (Signed)
Glucose is 106. Rest of CMP WNL.  CBC WNL

## 2023-08-03 ENCOUNTER — Ambulatory Visit: Payer: 59 | Admitting: Family Medicine

## 2023-08-04 ENCOUNTER — Ambulatory Visit: Payer: 59 | Admitting: Family Medicine

## 2023-08-04 ENCOUNTER — Encounter: Payer: Self-pay | Admitting: Family Medicine

## 2023-08-04 VITALS — BP 120/75 | HR 70 | Temp 97.2°F | Ht 70.0 in | Wt 225.4 lb

## 2023-08-04 DIAGNOSIS — R7989 Other specified abnormal findings of blood chemistry: Secondary | ICD-10-CM | POA: Diagnosis not present

## 2023-08-04 DIAGNOSIS — L4050 Arthropathic psoriasis, unspecified: Secondary | ICD-10-CM | POA: Diagnosis not present

## 2023-08-04 DIAGNOSIS — M25511 Pain in right shoulder: Secondary | ICD-10-CM

## 2023-08-04 DIAGNOSIS — E785 Hyperlipidemia, unspecified: Secondary | ICD-10-CM

## 2023-08-04 DIAGNOSIS — R7303 Prediabetes: Secondary | ICD-10-CM | POA: Insufficient documentation

## 2023-08-04 DIAGNOSIS — E559 Vitamin D deficiency, unspecified: Secondary | ICD-10-CM | POA: Diagnosis not present

## 2023-08-04 DIAGNOSIS — R739 Hyperglycemia, unspecified: Secondary | ICD-10-CM | POA: Diagnosis not present

## 2023-08-04 DIAGNOSIS — Z23 Encounter for immunization: Secondary | ICD-10-CM | POA: Diagnosis not present

## 2023-08-04 DIAGNOSIS — M1000 Idiopathic gout, unspecified site: Secondary | ICD-10-CM | POA: Diagnosis not present

## 2023-08-04 DIAGNOSIS — M25551 Pain in right hip: Secondary | ICD-10-CM

## 2023-08-04 LAB — LIPID PANEL
Cholesterol: 227 mg/dL — ABNORMAL HIGH (ref 0–200)
HDL: 57.7 mg/dL (ref >39.00)
LDL Cholesterol: 144 mg/dL — ABNORMAL HIGH (ref 0–99)
NonHDL: 168.81
Total CHOL/HDL Ratio: 4
Triglycerides: 123 mg/dL (ref 0.0–149.0)
VLDL: 24.6 mg/dL (ref 0.0–40.0)

## 2023-08-04 LAB — COMPREHENSIVE METABOLIC PANEL
ALT: 29 U/L (ref 0–53)
AST: 27 U/L (ref 0–37)
Albumin: 4.8 g/dL (ref 3.5–5.2)
Alkaline Phosphatase: 38 U/L — ABNORMAL LOW (ref 39–117)
BUN: 16 mg/dL (ref 6–23)
CO2: 29 meq/L (ref 19–32)
Calcium: 9.5 mg/dL (ref 8.4–10.5)
Chloride: 103 meq/L (ref 96–112)
Creatinine, Ser: 0.89 mg/dL (ref 0.40–1.50)
GFR: 90.78 mL/min (ref >60.00)
Glucose, Bld: 104 mg/dL — ABNORMAL HIGH (ref 70–99)
Potassium: 4.4 meq/L (ref 3.5–5.1)
Sodium: 140 meq/L (ref 135–145)
Total Bilirubin: 0.7 mg/dL (ref 0.2–1.2)
Total Protein: 7 g/dL (ref 6.0–8.3)

## 2023-08-04 LAB — VITAMIN D 25 HYDROXY (VIT D DEFICIENCY, FRACTURES): VITD: 42.91 ng/mL (ref 30.00–100.00)

## 2023-08-04 LAB — HEMOGLOBIN A1C: Hgb A1c MFr Bld: 6.3 % (ref 4.6–6.5)

## 2023-08-04 LAB — CBC
HCT: 47.1 % (ref 39.0–52.0)
Hemoglobin: 15.6 g/dL (ref 13.0–17.0)
MCHC: 33.1 g/dL (ref 30.0–36.0)
MCV: 92.1 fL (ref 78.0–100.0)
Platelets: 355 10*3/uL (ref 150.0–400.0)
RBC: 5.11 Mil/uL (ref 4.22–5.81)
RDW: 14.3 % (ref 11.5–15.5)
WBC: 4.5 10*3/uL (ref 4.0–10.5)

## 2023-08-04 LAB — TESTOSTERONE: Testosterone: 236.3 ng/dL — ABNORMAL LOW (ref 300.00–890.00)

## 2023-08-04 LAB — TSH: TSH: 1.87 u[IU]/mL (ref 0.35–5.50)

## 2023-08-04 MED ORDER — MELOXICAM 15 MG PO TABS: 15.0000 mg | ORAL_TABLET | Freq: Every day | ORAL | 0 refills | Status: DC

## 2023-08-04 NOTE — Assessment & Plan Note (Signed)
Check A1c.

## 2023-08-04 NOTE — Patient Instructions (Addendum)
It was very nice to see you today!  Please work on the exercises for your hip. Start the meloxicam.  Let us know if not improving.   You can try using the metatarsal pads for your foot.  We will check labs today.  Will get your pneumonia vaccine.  Return in about 6 months (around 02/01/2024) for Annual Physical.   Take care, Dr Jimmey Ralph  PLEASE NOTE:  If you had any lab tests, please let us know if you have not heard back within a few days. You may see your results on mychart before we have a chance to review them but we will give you a call once they are reviewed by [place].   If we ordered any referrals today, please let us know if you have not heard from their office within the next week.   If you had any urgent prescriptions sent in today, please check with the pharmacy within an hour of our visit to make sure the prescription was transmitted appropriately.   Please try these tips to maintain a healthy lifestyle:  Eat at least 3 REAL meals and 1-2 snacks per day.  Aim for no more than 5 hours between eating.  If you eat breakfast, please do so within one hour of getting up.   Each meal should contain half fruits/vegetables, one quarter protein, and one quarter carbs (no bigger than a computer mouse)  Cut down on sweet beverages. This includes juice, soda, and sweet tea.   Drink at least 1 glass of water with each meal and aim for at least 8 glasses per day  Exercise at least 150 minutes every week.

## 2023-08-04 NOTE — Assessment & Plan Note (Signed)
Check vitamin D.

## 2023-08-04 NOTE — Assessment & Plan Note (Signed)
Stable on testosterone replacement 4 pumps daily. Check labs daily.

## 2023-08-04 NOTE — Assessment & Plan Note (Signed)
Following with rheumatology. On taltz.

## 2023-08-04 NOTE — Progress Notes (Signed)
   Jeffrey Klein is a 63 y.o. male who presents today for an office visit.  Assessment/Plan:  New/Acute Problems: Right Shoulder Pain Improving. Continue management per sports medicine.   Right Hip Pain  History and exam consistent with trochanteric bursitis. We discussed home exercise program and handout was given. Will restart meloxicam.  He will let us know if not improving in the next couple of weeks and we can refer back to sports medicine.  We discussed reasons to return to care.  Loss of transverse arch Past callus buildup but otherwise no pain or metatarsalgia.  We did discuss inserts and cushioning.  We discussed reasons to return to care.  He will let us know if he develops any significant symptoms.  Chronic Problems Addressed Today: Dyslipidemia Doing well on crestor 10 mg daily. Check labs.   Low testosterone Stable on testosterone replacement 4 pumps daily. Check labs daily.   Psoriatic arthritis [facility]) Following with rheumatology. On taltz.   Vitamin D deficiency Check vitamin D.   Prediabetes Check A1c.  Preventative Healthcare  Check labs. Pneumonia vaccine given today.      Subjective:  HPI:  See Assessment / plan for status of chronic conditions.  He is still having some lingering right shoulder pain after falling several months ago. Working with physical therapy which is improving. HE is also having some right hip pain the last few weeks.  Located on outer part of his right hip.  Worse with certain rotations.  He has also had decreased range of motion.  No specific treatments tried for this.  He has also noticed more callus buildup in the middle part of his left foot.  No pain.  No treatments tried.       Objective:  Physical Exam: BP 120/75   Pulse 70   Temp (!) 97.2 F (36.2 C) (Temporal)   Ht 5\' 10"  (1.778 m)   Wt 225 lb 6.4 oz (102.2 kg)   SpO2 96%   BMI 32.34 kg/m   Gen: No acute distress, resting comfortably CV: Regular rate and rhythm  with no murmurs appreciated Pulm: Normal work of breathing, clear to auscultation bilaterally with no crackles, wheezes, or rhonchi MUSCULOSKELETAL - right hip: No deformities.  Tender to palpation along right greater trochanter.  Decreased internal and external rotation.  Pain elicited with FABER maneuver. - Left Foot: Loss of transverse arch noted. Neuro: Grossly normal, moves all extremities Psych: Normal affect and thought content      Gamaliel Charney M. Jimmey Ralph, MD 08/04/2023 9:11 AM

## 2023-08-04 NOTE — Assessment & Plan Note (Signed)
Doing well on crestor 10 mg daily. Check labs.

## 2023-08-05 ENCOUNTER — Encounter: Payer: Self-pay | Admitting: Family Medicine

## 2023-08-05 ENCOUNTER — Other Ambulatory Visit: Payer: Self-pay | Admitting: Family Medicine

## 2023-08-05 NOTE — Progress Notes (Signed)
His cholesterol is elevated can we verify that he has been taking his Crestor?  His testosterone is also still on the low side.  Can we verify that he has been taking 4 pumps daily?  His sugar is in the borderline diabetic range.  We can recheck this again in 6 months.  The rest of his labs are all stable and we can recheck again in next office visit.  He should continue to work on diet and exercise.

## 2023-08-11 ENCOUNTER — Other Ambulatory Visit: Payer: Self-pay

## 2023-08-11 DIAGNOSIS — L4050 Arthropathic psoriasis, unspecified: Secondary | ICD-10-CM

## 2023-08-11 DIAGNOSIS — L409 Psoriasis, unspecified: Secondary | ICD-10-CM

## 2023-08-11 MED ORDER — TALTZ 80 MG/ML ~~LOC~~ SOSY: PREFILLED_SYRINGE | SUBCUTANEOUS | 2 refills | Status: DC

## 2023-08-11 NOTE — Telephone Encounter (Signed)
 Patient contacted the office and states he needs a refill of Taltz  sent to [facility].   Last Fill: 04/15/2023  Labs: 08/03/2022  Glucose 104 Alkaline Phosphatase 38  TB Gold: 11/03/2022 Negative  Next Visit: 09/22/2023  Last Visit: 04/17/2023  DX: Psoriatic arthritis (HCC)   Current Dose per office note 04/17/2023: Taltz  80 mg sq injections every 4 weeks   Okay to refill Taltz ?

## 2023-08-12 ENCOUNTER — Telehealth: Payer: Self-pay | Admitting: Pharmacist

## 2023-08-12 NOTE — Telephone Encounter (Signed)
Submitted a Prior Authorization RENEWAL request to [facility] for TALTZ via CoverMyMeds. Will update once we receive a response.  Key: Jeffrey Klein

## 2023-08-20 NOTE — Telephone Encounter (Signed)
Received a fax regarding Prior Authorization from [facility] for TALTZ. Authorization has been DENIED because: 1) you must have history of failure, contraindication, or intolerance to three of the following preferred: adalimumab, Cimzia, Cosentyx, Enbrel, Simponi, Skyrizi, [place], [place] AND 2) you must have history of failure, contraindication, or intolerance to one of the following: Abbe [place], Rinvoq  Will work on appeal  Chesley Mires, PharmD, MPH, BCPS, CPP Clinical Pharmacist (Rheumatology and Pulmonology)

## 2023-08-21 NOTE — Telephone Encounter (Signed)
Submitted an URGENT appeal to [facility] for TALTZ.  Case # [id number] Phone: [phone] Fax: [phone]  Chesley Mires, PharmD, MPH, BCPS, CPP Clinical Pharmacist (Rheumatology and Pulmonology)

## 2023-09-03 NOTE — Telephone Encounter (Signed)
 Received fax from [facility]. Denial for Jeffrey Klein has been OVERTURNED. Jeffrey Klein is APPROVED from 08/21/2023 through 08/20/2024  Transaction number: [id number]  Jeffrey Klein, PharmD, MPH, BCPS, CPP Clinical Pharmacist (Rheumatology and Pulmonology)

## 2023-09-08 NOTE — Progress Notes (Signed)
 Office Visit Note  Patient: Jeffrey Klein             Date of Birth: 02/26/1961           MRN: [id number]             PCP: Ardith Dark, MD Referring: Ardith Dark, MD Visit Date: 09/22/2023 Occupation: @GUAROCC @  Subjective:  Pain in both hips  History of Present Illness: Jeffrey Klein is a 63 y.o. male with psoriatic arthritis, psoriasis and osteoarthritis.  He returns today after his last visit in October 2024.  He states he ran out of Taltz 1 month ago.  He has not had a flare of psoriatic arthritis or psoriasis.  Although he has been experiencing increased pain and discomfort in his hip joints more so on the right than the left hip for the last 3 months.  He states he has been having nocturnal pain.  For the last couple of months he has been taking meloxicam prescribed by his PCP which has been helpful.  He denies any psoriasis patches.  He states last July he fell and had nondisplaced right humeral head fracture.  He did not require surgery.  It is gradually healing and he is recovering range of motion.    Activities of Daily Living:  Patient reports morning stiffness for all day.  Patient Reports nocturnal pain.  Hip pain Difficulty dressing/grooming: Reports Difficulty climbing stairs: Reports Difficulty getting out of chair: Reports Difficulty using hands for taps, buttons, cutlery, and/or writing: Denies  Review of Systems  Constitutional:  Positive for fatigue.  HENT:  Positive for mouth dryness. Negative for mouth sores.   Eyes:  Positive for dryness.  Respiratory:  Negative for shortness of breath.   Cardiovascular:  Negative for chest pain and palpitations.  Gastrointestinal:  Negative for blood in stool, constipation and diarrhea.  Endocrine: Negative for increased urination.  Genitourinary:  Negative for involuntary urination.  Musculoskeletal:  Positive for joint pain, gait problem, joint pain, myalgias, morning stiffness and myalgias. Negative for joint  swelling, muscle weakness and muscle tenderness.  Skin:  Negative for color change, rash and sensitivity to sunlight.  Allergic/Immunologic: Negative for susceptible to infections.  Neurological:  Positive for light-headedness. Negative for dizziness and headaches.  Hematological:  Negative for swollen glands.  Psychiatric/Behavioral:  Positive for sleep disturbance. Negative for depressed mood. The patient is not nervous/anxious.     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   Gout 11/18/2019   Dyslipidemia 11/18/2019   Low testosterone 11/18/2019   Diverticulosis 11/18/2019   Psoriatic arthritis (HCC) 11/18/2019    Past Medical History:  Diagnosis Date   Diverticulosis    diverticulitis in 2018, no recurrence   GERD (gastroesophageal reflux disease)    High cholesterol    Psoriasis    Psoriatic arthritis (HCC)     Family History  Problem Relation Age of Onset   Psoriasis Sister    Psoriasis Paternal Grandmother    Healthy Daughter    ADD / ADHD Son    Drug abuse Son    Colon cancer  Neg Hx    Esophageal cancer Neg Hx    Rectal cancer Neg Hx    Stomach cancer Neg Hx    Past Surgical History:  Procedure Laterality Date   COLONOSCOPY  08/29/2016   [facility]. Dr. Louellen Molder: Single 5 mm transverse colon adenoma, sigmoid diverticulosis.  Repeat 5 years   dental surgeries     KNEE ARTHROSCOPY Bilateral    LASIK Bilateral 2006   TONSILLECTOMY     as a child    TOOTH EXTRACTION     WRIST SURGERY Right    Social History   Social History  Narrative   Not on file   Immunization History  Administered Date(s) Administered   Influenza Inj Mdck Quad Pf 05/16/2022   Influenza, Seasonal, Injecte, Preservative Fre 04/06/2023   Influenza,inj,Quad PF,6-35 Mos 05/19/2022   Influenza-Unspecified 02/21/2021, 05/06/2023   PFIZER(Purple Top)SARS-COV-2 Vaccination 10/06/2019, 10/31/2019, 03/05/2020   PNEUMOCOCCAL CONJUGATE-20 08/04/2023   Pneumococcal-Unspecified 08/19/2018   Tdap 11/19/2020   Zoster Recombinant(Shingrix) 04/05/2019, 06/17/2019     Objective: Vital Signs: BP (!) 154/88 (BP Location: Left Arm, Patient Position: Sitting, Cuff Size: Normal)   Pulse 86   Resp 16   Ht 5\' 10"  (1.778 m)   Wt 228 lb 9.6 oz (103.7 kg)   BMI 32.80 kg/m    Physical Exam Vitals and nursing note reviewed.  Constitutional:      Appearance: He is well-developed.  HENT:     Head: Normocephalic and atraumatic.  Eyes:     Conjunctiva/sclera: Conjunctivae normal.     Pupils: Pupils are equal, round, and reactive to light.  Cardiovascular:     Rate and Rhythm: Normal rate and regular rhythm.     Heart sounds: Normal heart sounds.  Pulmonary:     Effort: Pulmonary effort is normal.     Breath sounds: Normal breath sounds.  Abdominal:     General: Bowel sounds are normal.     Palpations: Abdomen is soft.  Musculoskeletal:     Cervical back: Normal range of motion and neck supple.  Skin:    General: Skin is warm and dry.     Capillary Refill: Capillary refill takes less than 2 seconds.  Neurological:     Mental Status: He is alert and oriented to person, place, and time.  Psychiatric:        Behavior: Behavior normal.      Musculoskeletal Exam: Cervical, thoracic and lumbar spine were in good range of motion.  He had full abduction of his right shoulder joint and limited internal rotation.  Left shoulder joint was in full range of motion.  Elbow joints and wrist joints in good range of motion.  He had bilateral PIP and DIP thickening  with no synovitis.  He had painful limited range of motion of his right hip joint.  He has some limitation with range of motion of his left hip joint.  Knee joints in good range of motion without any warmth swelling or effusion.  There was no tenderness over ankles or MTPs.  There was no Achilles tendinitis or plantar fasciitis.  CDAI Exam: CDAI Score: -- Patient Global: --; Provider Global: -- Swollen: --; Tender: -- Joint Exam 09/22/2023   No joint exam has been documented for this visit   There is currently no information documented on the homunculus. Go to the Rheumatology activity and complete the homunculus joint exam.  Investigation: No additional findings.  Imaging: No results found.  Recent Labs: Lab Results  Component Value Date   WBC 4.5 08/04/2023   HGB 15.6 08/04/2023   PLT 355.0 08/04/2023   NA 140 08/04/2023   K 4.4 08/04/2023   CL 103 08/04/2023   CO2 29 08/04/2023   GLUCOSE 104 (H) 08/04/2023   BUN 16 08/04/2023   CREATININE 0.89 08/04/2023   BILITOT 0.7 08/04/2023   ALKPHOS 38 (L) 08/04/2023   AST 27 08/04/2023   ALT 29 08/04/2023   PROT 7.0 08/04/2023   ALBUMIN 4.8 08/04/2023   CALCIUM 9.5 08/04/2023   GFRAA 107 08/16/2020   QFTBGOLDPLUS NEGATIVE 11/03/2022    Speciality Comments: Inadequate response to Enbrel, Stelara, Cosentyx. He has been on Taltz for 6 years.  Procedures:  No procedures performed Allergies: Patient has no known allergies.   Assessment / Plan:     Visit Diagnoses: Psoriatic arthritis (HCC)-patient has not had a flare of psoriatic arthritis.  He states he ran out of Altamease Oiler about a month ago.  He is expecting a shipment next week.  No synovitis was noted on the examination today.  Psoriasis - Diagnosed in 1985. Umbilical region. No new patches.  He has a prescription for betamethasone valerate 0.1% cream which he applies topically as needed-  High risk medication use - Taltz 80 mg sq injections every 4 weeks-started  09/23/16-initially prescribed by rheum in [place].  Inadequate response to Enbrel, Stelara, and Cosentyx.  Labs from August 04, 2023 were reviewed.  CBC and CMP were normal.  He was advised to get labs every 3 months.  Information immunization was placed in the AVS.  He was advised to hold Taltz if he develops an infection resume after the infection resolves.  Primary osteoarthritis of both hands-he had bilateral PIP and DIP thickening with no synovitis.  Primary osteoarthritis of both hips -he has been having increased pain in both hips and nocturnal pain in his hips.  He had limited range of motion of his right hip joint.  Plan: XR HIPS BILAT W OR W/O PELVIS 3-4 VIEWS.  Right hip joint showed severe osteoarthritis.  Left hip joint x-rays were unremarkable.  X-ray findings discussed with the patient.  Patient may benefit from either cortisone injection or total hip replacement in the future.  I would like for him to establish with an orthopedic surgeon.  Will place a referral to orthopedics.    Primary osteoarthritis of both knees -no warmth swelling or effusion was noted.  X-rays 05/2020: Left knee: severe osteoarthritis, severe chondromalacia patella and the right knee reveals mild osteoarthritis, moderate chondromalacia patella.  Primary osteoarthritis of both feet-no synovitis was noted.  No plantar fasciitis or Achilles tendinitis was noted.  Idiopathic chronic gout without tophus, unspecified site -he has not had any gout flare.  Allopurinol 100 mg daily prescribed by Dr. Jimmey Ralph. uric acid: 6.9 on 02/02/2023.  Chronic right shoulder pain -he had some limited internal rotation.  Under care of Dr. Jean Rosenthal.  Fall on 01/08/2023.  X-rays performed on 01/15/2023.  Did not require surgical intervention.  Weight gain-he has gained weight.  Weight loss diet and exercise was emphasized.  Increased risk of heart disease with psoriatic arthritis was discussed.  A handout was placed in the AVS.  Association of  weight gain with increased joint pain was also discussed.  Family history of psoriasis in sister  Hyperlipidemia, mixed  History of diverticulosis  Selective IgM deficiency (HCC)  Orders: Orders Placed This Encounter  Procedures   XR HIPS BILAT W OR W/O PELVIS 3-4 VIEWS  No orders of the defined types were placed in this encounter.    Follow-Up Instructions: Return for Psoriatic arthritis, Osteoarthritis, Gout.   Pollyann Savoy, MD  Note - This record has been created using Animal nutritionist.  Chart creation errors have been sought, but may not always  have been located. Such creation errors do not reflect on  the standard of medical care.

## 2023-09-22 ENCOUNTER — Ambulatory Visit

## 2023-09-22 ENCOUNTER — Ambulatory Visit: Payer: 59 | Attending: Rheumatology | Admitting: Rheumatology

## 2023-09-22 ENCOUNTER — Encounter: Payer: Self-pay | Admitting: Rheumatology

## 2023-09-22 VITALS — BP 154/88 | HR 86 | Resp 16 | Ht 70.0 in | Wt 228.6 lb

## 2023-09-22 DIAGNOSIS — Z79899 Other long term (current) drug therapy: Secondary | ICD-10-CM | POA: Diagnosis not present

## 2023-09-22 DIAGNOSIS — L409 Psoriasis, unspecified: Secondary | ICD-10-CM

## 2023-09-22 DIAGNOSIS — M170 Bilateral primary osteoarthritis of knee: Secondary | ICD-10-CM

## 2023-09-22 DIAGNOSIS — L4050 Arthropathic psoriasis, unspecified: Secondary | ICD-10-CM | POA: Diagnosis not present

## 2023-09-22 DIAGNOSIS — M19041 Primary osteoarthritis, right hand: Secondary | ICD-10-CM

## 2023-09-22 DIAGNOSIS — D804 Selective deficiency of immunoglobulin M [IgM]: Secondary | ICD-10-CM

## 2023-09-22 DIAGNOSIS — M19071 Primary osteoarthritis, right ankle and foot: Secondary | ICD-10-CM

## 2023-09-22 DIAGNOSIS — M19072 Primary osteoarthritis, left ankle and foot: Secondary | ICD-10-CM

## 2023-09-22 DIAGNOSIS — G8929 Other chronic pain: Secondary | ICD-10-CM

## 2023-09-22 DIAGNOSIS — E782 Mixed hyperlipidemia: Secondary | ICD-10-CM

## 2023-09-22 DIAGNOSIS — R635 Abnormal weight gain: Secondary | ICD-10-CM

## 2023-09-22 DIAGNOSIS — M160 Bilateral primary osteoarthritis of hip: Secondary | ICD-10-CM

## 2023-09-22 DIAGNOSIS — M1A00X0 Idiopathic chronic gout, unspecified site, without tophus (tophi): Secondary | ICD-10-CM

## 2023-09-22 DIAGNOSIS — M25511 Pain in right shoulder: Secondary | ICD-10-CM

## 2023-09-22 DIAGNOSIS — M19042 Primary osteoarthritis, left hand: Secondary | ICD-10-CM

## 2023-09-22 DIAGNOSIS — Z8719 Personal history of other diseases of the digestive system: Secondary | ICD-10-CM

## 2023-09-22 DIAGNOSIS — Z840 Family history of diseases of the skin and subcutaneous tissue: Secondary | ICD-10-CM

## 2023-09-22 NOTE — Patient Instructions (Addendum)
 Standing Labs We placed an order today for your standing lab work.   Please have your standing labs drawn in April and every 3 months  Please have your labs drawn 2 weeks prior to your appointment so that the provider can discuss your lab results at your appointment, if possible.  Please note that you may see your imaging and lab results in MyChart before we have reviewed them. We will contact you once all results are reviewed. Please allow our office up to 72 hours to thoroughly review all of the results before contacting the office for clarification of your results.  WALK-IN LAB HOURS  Monday through Thursday from 8:00 am -12:30 pm and 1:00 pm-5:00 pm and Friday from 8:00 am-12:00 pm.  Patients with office visits requiring labs will be seen before walk-in labs.  You may encounter longer than normal wait times. Please allow additional time. Wait times may be shorter on  Monday and Thursday afternoons.  We do not book appointments for walk-in labs. We appreciate your patience and understanding with our staff.   Labs are drawn by [facility]. Please bring your co-pay at the time of your lab draw.  You may receive a bill from [facility] for your lab work.  Please note if you are on Hydroxychloroquine and and an order has been placed for a Hydroxychloroquine level,  you will need to have it drawn 4 hours or more after your last dose.  If you wish to have your labs drawn at another location, please call the office 24 hours in advance so we can fax the orders.  The office is located at [address], [address], [place], [place] [address]   If you have any questions regarding directions or hours of operation,  please call [phone].   As a reminder, please drink plenty of water prior to coming for your lab work. Thanks!   Vaccines You are taking a medication(s) that can suppress your immune system.  The following immunizations are recommended: Flu annually Covid-19  Td/Tdap (tetanus,  diphtheria, pertussis) every 10 years Pneumonia (Prevnar 15 then Pneumovax 23 at least 1 year apart.  Alternatively, can take Prevnar 20 without needing additional dose) Shingrix: 2 doses from 4 weeks to 6 months apart  Please check with your PCP to make sure you are up to date.  If you have signs or symptoms of an infection or start antibiotics: First, call your PCP for workup of your infection. Hold your medication through the infection, until you complete your antibiotics, and until symptoms resolve if you take the following: Injectable medication (Actemra, Benlysta, Cimzia, Cosentyx, Enbrel, Humira, Kevzara, Orencia, Remicade, Simponi, Stelara, Taltz, Tremfya) Methotrexate Leflunomide (Arava) Mycophenolate (Cellcept) Harriette Ohara, Olumiant, or Rinvoq   Heart Disease Prevention   Your inflammatory disease increases your risk of heart disease which includes heart attack, stroke, atrial fibrillation (irregular heartbeats), high blood pressure, heart failure and atherosclerosis (plaque in the arteries).  It is important to reduce your risk by:   Keep blood pressure, cholesterol, and blood sugar at healthy levels   Smoking Cessation   Maintain a healthy weight  BMI 20-25   Eat a healthy diet  Plenty of fresh fruit, vegetables, and whole grains  Limit saturated fats, foods high in sodium, and added sugars  DASH and Mediterranean diet   Increase physical activity  Recommend moderate physically activity for 150 minutes per week/ 30 minutes a day for five days a week These can be broken up into three separate ten-minute sessions  during the day.   Reduce Stress  Meditation, slow breathing exercises, yoga, coloring books  Dental visits twice a year

## 2023-10-12 ENCOUNTER — Ambulatory Visit: Admitting: Orthopaedic Surgery

## 2023-10-12 DIAGNOSIS — M25551 Pain in right hip: Secondary | ICD-10-CM

## 2023-10-12 DIAGNOSIS — M1611 Unilateral primary osteoarthritis, right hip: Secondary | ICD-10-CM | POA: Diagnosis not present

## 2023-10-12 NOTE — Progress Notes (Signed)
 The patient is a very pleasant and active 63 year old gentleman sent to me from Dr. Corliss Skains due to significant right hip pain and x-rays showing osteoarthritis of the right hip.  He does report groin pain and he says it is getting more painful with activities.  When he first gets up to take some steps he pauses for a little bit and is able to walk it off.  He does have pain that wakes him up at night as well.  It is all in the right hip and groin area.  He denies any left hip issues.  He does have psoriatic arthritis.  He has had arthroscopic surgery on both knees and surgery on his right wrist.  He did fall last year injuring his right shoulder.  He recently has been taking prednisone that he had leftover for a few days and that helped with his pain.  He says some days are better than others.  I was able to review his past medical history and medications within epic.  He is not a diabetic and he is not obese.  Examination of his left hip shows smooth internal and external rotation with no blocks to rotation and no pain.  His right hip has limitations with internal and external rotation with stiffness and significant pain in the groin.  X-rays in the canopy system of his pelvis and both hips shows significant arthritis of the right hip with near bone-on-bone wear.  There are some slight flattening of the femoral head as well as joint space narrowing and osteophytes around the right hip.  The left hip appears normal.  We had a long and thorough discussion about hip replacement surgery.  I went over his x-rays and a hip replacement model and gave him a handout about hip replacement surgery.  We discussed the risk and benefits of the surgery and what to expect from an intraoperative and postoperative standpoint.  He said he will take these things in consideration and if things worsen he will let us know.  All questions and concerns were addressed and answered.

## 2023-10-29 ENCOUNTER — Encounter: Payer: Self-pay | Admitting: Orthopaedic Surgery

## 2023-10-30 ENCOUNTER — Encounter: Payer: Self-pay | Admitting: Family Medicine

## 2023-10-31 ENCOUNTER — Other Ambulatory Visit: Payer: Self-pay | Admitting: Physician Assistant

## 2023-10-31 DIAGNOSIS — Z79899 Other long term (current) drug therapy: Secondary | ICD-10-CM

## 2023-10-31 DIAGNOSIS — Z9225 Personal history of immunosupression therapy: Secondary | ICD-10-CM

## 2023-10-31 DIAGNOSIS — L4050 Arthropathic psoriasis, unspecified: Secondary | ICD-10-CM

## 2023-10-31 DIAGNOSIS — Z111 Encounter for screening for respiratory tuberculosis: Secondary | ICD-10-CM

## 2023-10-31 DIAGNOSIS — L409 Psoriasis, unspecified: Secondary | ICD-10-CM

## 2023-11-02 NOTE — Telephone Encounter (Signed)
 Last Fill: 08/11/2023  Labs: 08/04/2023 Glucose 104, Alk. Phos 38  TB Gold: 11/03/2022 Neg    Next Visit: 02/22/2024  Last Visit: 09/22/2023  [id number] arthritis   Current Dose per office note 09/22/2023: Taltz  80 mg sq injections every 4 weeks   Patient advised he is due to update labs.   Okay to refill Taltz ?

## 2023-11-03 ENCOUNTER — Ambulatory Visit: Admitting: Family Medicine

## 2023-11-03 ENCOUNTER — Encounter: Payer: Self-pay | Admitting: Family Medicine

## 2023-11-03 VITALS — BP 134/80 | HR 72 | Temp 98.1°F | Ht 70.0 in | Wt 222.0 lb

## 2023-11-03 DIAGNOSIS — M1611 Unilateral primary osteoarthritis, right hip: Secondary | ICD-10-CM

## 2023-11-03 DIAGNOSIS — R7303 Prediabetes: Secondary | ICD-10-CM | POA: Diagnosis not present

## 2023-11-03 MED ORDER — GABAPENTIN 300 MG PO CAPS: 300.0000 mg | ORAL_CAPSULE | Freq: Every day | ORAL | 3 refills | Status: DC

## 2023-11-03 MED ORDER — MELOXICAM 15 MG PO TABS: 15.0000 mg | ORAL_TABLET | Freq: Every day | ORAL | 0 refills | Status: DC

## 2023-11-03 NOTE — Progress Notes (Signed)
   Jeffrey Klein is a 63 y.o. male who presents today for an office visit.  Assessment/Plan:  Chronic Problems Addressed Today: Unilateral primary osteoarthritis, right hip Had lengthy discussion with patient today regarding treatment plan for his right hip osteoarthritis.  Has been following with orthopedics for this.  They recommended hip replacement surgery at some point in the near future though he would like to put this off for as long as possible.  He is interested in medications to help with managing his pain.  He would like to avoid narcotics.  Is concerned about potential side effects of prolonged NSAID use.  He did ask about prednisone however discussed several issues with long-term use of this and recommended against this for now.  He has previously done well with meloxicam .  Would be reasonable for him to continue using this as needed.  He can also use over-the-counter Tylenol as needed conjunction with this as well.  We did discuss other medications options to help him with managing his pain until he is ready to proceed with hip replacement.  Will try gabapentin 300 mg nightly.  We discussed potential side effects.  He will follow-up with me in a few weeks via MyChart we can adjust medications as tolerated.  He is not interested in getting any further hip injections performed at this point though if symptoms are not controlled with above would consider referral to PT or sports medicine to discuss nonsurgical options  Prediabetes Last A1c 6.3.  We can recheck at next office visit.  As above recommended against long-term for frequent use of prednisone for management of his osteoarthritis.      Subjective:  HPI:  See Assessment / plan for status of chronic conditions. Patient is here today discuss right hip pain. He has been following with orthopedics for this and they are planning on surgery at some point in the future. He would like to hold off on this as long as he can.  He has been  tolerating meloxicam  well.  Tried a few other over-the-counter medications with modest improvement as well.       Objective:  Physical Exam: BP 134/80   Pulse 72   Temp 98.1 F (36.7 C) (Temporal)   Ht 5\' 10"  (1.778 m)   Wt 222 lb (100.7 kg)   SpO2 96%   BMI 31.85 kg/m   Gen: No acute distress, resting comfortably Neuro: Grossly normal, moves all extremities Psych: Normal affect and thought content      Shontelle Muska M. Daneil Dunker, MD 11/03/2023 11:50 AM

## 2023-11-03 NOTE — Assessment & Plan Note (Signed)
 Last A1c 6.3.  We can recheck at next office visit.  As above recommended against long-term for frequent use of prednisone for management of his osteoarthritis.

## 2023-11-03 NOTE — Assessment & Plan Note (Addendum)
 Had lengthy discussion with patient today regarding treatment plan for his right hip osteoarthritis.  Has been following with orthopedics for this.  They recommended hip replacement surgery at some point in the near future though he would like to put this off for as long as possible.  He is interested in medications to help with managing his pain.  He would like to avoid narcotics.  Is concerned about potential side effects of prolonged NSAID use.  He did ask about prednisone however discussed several issues with long-term use of this and recommended against this for now.  He has previously done well with meloxicam .  Would be reasonable for him to continue using this as needed.  He can also use over-the-counter Tylenol as needed conjunction with this as well.  We did discuss other medications options to help him with managing his pain until he is ready to proceed with hip replacement.  Will try gabapentin 300 mg nightly.  We discussed potential side effects.  He will follow-up with me in a few weeks via MyChart we can adjust medications as tolerated.  He is not interested in getting any further hip injections performed at this point though if symptoms are not controlled with above would consider referral to PT or sports medicine to discuss nonsurgical options

## 2023-11-03 NOTE — Patient Instructions (Signed)
 It was very nice to see you today!  Please try the meloxicam  and gabapentin.   Please follow-up with me in a few weeks to let us  know how this combination is working for you.  Return if symptoms worsen or fail to improve.   Take care, Dr Daneil Dunker  PLEASE NOTE:  If you had any lab tests, please let us  know if you have not heard back within a few days. You may see your results on mychart before we have a chance to review them but we will give you a call once they are reviewed by us .   If we ordered any referrals today, please let us  know if you have not heard from their office within the next week.   If you had any urgent prescriptions sent in today, please check with the pharmacy within an hour of our visit to make sure the prescription was transmitted appropriately.   Please try these tips to maintain a healthy lifestyle:  Eat at least 3 REAL meals and 1-2 snacks per day.  Aim for no more than 5 hours between eating.  If you eat breakfast, please do so within one hour of getting up.   Each meal should contain half fruits/vegetables, one quarter protein, and one quarter carbs (no bigger than a computer mouse)  Cut down on sweet beverages. This includes juice, soda, and sweet tea.   Drink at least 1 glass of water with each meal and aim for at least 8 glasses per day  Exercise at least 150 minutes every week.

## 2023-11-23 ENCOUNTER — Other Ambulatory Visit: Payer: Self-pay | Admitting: Physician Assistant

## 2023-11-23 DIAGNOSIS — L4050 Arthropathic psoriasis, unspecified: Secondary | ICD-10-CM

## 2023-11-23 DIAGNOSIS — L409 Psoriasis, unspecified: Secondary | ICD-10-CM

## 2023-11-23 NOTE — Telephone Encounter (Signed)
 Last Fill: 11/02/2023  Labs: 08/04/2023 CBC WNL Glucose 104 Alkaline phosphatase 38    TB Gold: 11/03/2022 TB gold negative    Next Visit: 02/22/2024  Last Visit: 09/22/2023  [id number] arthritis   Current Dose per office note 09/22/2023: Taltz  80 mg sq injections every 4 weeks   Patient advised that labs need to be updated, stated he would be in Tuesday 11/24/2023   Okay to refill Taltz ?

## 2023-11-26 ENCOUNTER — Other Ambulatory Visit: Payer: Self-pay | Admitting: *Deleted

## 2023-11-26 DIAGNOSIS — Z111 Encounter for screening for respiratory tuberculosis: Secondary | ICD-10-CM

## 2023-11-26 DIAGNOSIS — Z9225 Personal history of immunosupression therapy: Secondary | ICD-10-CM

## 2023-11-26 DIAGNOSIS — Z79899 Other long term (current) drug therapy: Secondary | ICD-10-CM

## 2023-11-27 ENCOUNTER — Ambulatory Visit: Payer: Self-pay | Admitting: Physician Assistant

## 2023-11-27 NOTE — Progress Notes (Signed)
 CBC and CMP WNL

## 2023-11-30 LAB — CBC WITH DIFFERENTIAL/PLATELET
Absolute Lymphocytes: 1972 {cells}/uL (ref 850–3900)
Absolute Monocytes: 557 {cells}/uL (ref 200–950)
Basophils Absolute: 41 {cells}/uL (ref 0–200)
Basophils Relative: 0.7 %
Eosinophils Absolute: 151 {cells}/uL (ref 15–500)
Eosinophils Relative: 2.6 %
HCT: 45.9 % (ref 38.5–50.0)
Hemoglobin: 15.2 g/dL (ref 13.2–17.1)
MCH: 29.8 pg (ref 27.0–33.0)
MCHC: 33.1 g/dL (ref 32.0–36.0)
MCV: 90 fL (ref 80.0–100.0)
MPV: 9.4 fL (ref 7.5–12.5)
Monocytes Relative: 9.6 %
Neutro Abs: 3080 {cells}/uL (ref 1500–7800)
Neutrophils Relative %: 53.1 %
Platelets: 348 10*3/uL (ref 140–400)
RBC: 5.1 10*6/uL (ref 4.20–5.80)
RDW: 13.1 % (ref 11.0–15.0)
Total Lymphocyte: 34 %
WBC: 5.8 10*3/uL (ref 3.8–10.8)

## 2023-11-30 LAB — BASIC METABOLIC PANEL WITH GFR: Glucose: 97

## 2023-11-30 LAB — COMPREHENSIVE METABOLIC PANEL WITH GFR
AG Ratio: 1.8 (calc) (ref 1.0–2.5)
ALT: 27 U/L (ref 9–46)
AST: 21 U/L (ref 10–35)
Albumin: 4.4 g/dL (ref 3.6–5.1)
Alkaline phosphatase (APISO): 39 U/L (ref 35–144)
BUN: 19 mg/dL (ref 7–25)
CO2: 30 mmol/L (ref 20–32)
Calcium: 9.7 mg/dL (ref 8.6–10.3)
Chloride: 103 mmol/L (ref 98–110)
Creat: 0.96 mg/dL (ref 0.70–1.35)
Globulin: 2.5 g/dL (ref 1.9–3.7)
Glucose, Bld: 93 mg/dL (ref 65–99)
Potassium: 4.6 mmol/L (ref 3.5–5.3)
Sodium: 140 mmol/L (ref 135–146)
Total Bilirubin: 0.6 mg/dL (ref 0.2–1.2)
Total Protein: 6.9 g/dL (ref 6.1–8.1)
eGFR: 88 mL/min/{1.73_m2} (ref >=60)

## 2023-11-30 LAB — LIPID PANEL
Cholesterol: 163 (ref 0–200)
HDL: 54 (ref 35–70)
LDL Cholesterol: 95
Triglycerides: 68 (ref 40–160)

## 2023-11-30 LAB — QUANTIFERON-TB GOLD PLUS
Mitogen-NIL: 7.69 [IU]/mL
NIL: 0.01 [IU]/mL
QuantiFERON-TB Gold Plus: NEGATIVE
TB1-NIL: 0 [IU]/mL
TB2-NIL: 0 [IU]/mL

## 2023-11-30 LAB — PSA: PSA: 1.17

## 2023-11-30 LAB — TESTOSTERONE: Testosterone: >1000

## 2023-12-01 NOTE — Progress Notes (Signed)
 TB gold negative

## 2023-12-04 ENCOUNTER — Ambulatory Visit: Admitting: Family Medicine

## 2023-12-04 ENCOUNTER — Encounter: Payer: Self-pay | Admitting: Family Medicine

## 2023-12-04 VITALS — BP 118/74 | HR 90 | Temp 97.7°F | Ht 70.0 in | Wt 218.6 lb

## 2023-12-04 DIAGNOSIS — R42 Dizziness and giddiness: Secondary | ICD-10-CM | POA: Diagnosis not present

## 2023-12-04 MED ORDER — ONDANSETRON 4 MG PO TBDP: 4.0000 mg | ORAL_TABLET | Freq: Three times a day (TID) | ORAL | 0 refills | Status: DC | PRN

## 2023-12-04 NOTE — Progress Notes (Signed)
 Jeffrey Klein is a 63 y.o. male who presents today for an office visit.  Assessment/Plan:  Dizziness  Unclear etiology.  His presentation is similar to the symptoms that he had several years ago.  He had a extensive ENT and neurologic workup at that time including MRI and ENT referral which was essentially negative.  We did check orthostatic vitals today which were negative as well.  Recently had labs done with rheumatology-do not think we need to repeat these today.  His exam overall today is reassuring-do not think we need to repeat any imaging at this point.  Discussed with patient that he may be having transient low blood pressure or low blood sugar drops precipitating his symptoms.  We will order a Holter monitor and echocardiogram to further assess cardiac issues.  He does not have a way to check his blood sugar at home though did advise patient if he has a recurrence of symptoms to try eating a piece of candy or some other high sugar food or beverage to see if this alleviates his symptoms.  Also advised patient to try to check his blood pressure if he has another recurrence of symptoms.  Will send in a small supply of Zofran  to use as needed in case he has another episode of severe nausea due to the dizziness.  We discussed reasons to return to care.  If his above workup is negative would consider referral to neurology.     Subjective:  HPI:  See Assessment / plan for status of chronic conditions.  Patient here today with recurrence of episodic dizziness, diaphoresis, and emesis.  Symptoms started several days ago.  He did have a similar flareup of the same constellation of symptoms several years ago and had extensive workup at that time including brain MRI and ENT referral all of which were unrevealing.  His symptoms gradually resolved without any intervention.  Did not have any benefit from Epley maneuver or meclizine  at that time.  He was symptom-free until symptoms returned about a week or  so ago.  Since then he has had severe intermittent issues of diaphoresis and dizziness.  Yesterday he had several episodes of emesis due to the dizziness as well.  Describes the dizziness as an off-balance sensation.  Does not have any room spinning sensation.  He has not noticed that standing or sitting makes his symptoms worse.  He has not noticed any weakness or numbness.  No headache.  No vision changes.  No tingling.  He did have a rash on his chest and neck a few days ago which has since resolved.  He did have labs with rheumatology several days ago which were negative.  He has been trying to stay well-hydrated.  Typically does not go long periods of time without eating.        Objective:  Physical Exam: BP 118/74   Pulse 90   Temp 97.7 F (36.5 C) (Temporal)   Ht 5\' 10"  (1.778 m)   Wt 218 lb 9.6 oz (99.2 kg)   SpO2 96%   BMI 31.37 kg/m   Gen: No acute distress, resting comfortably HEENT: TMs clear. CV: Regular rate and rhythm with no murmurs appreciated Pulm: Normal work of breathing, clear to auscultation bilaterally with no crackles, wheezes, or rhonchi Neuro: Cranial nerves III through XII intact.  Finger-nose is finger testing intact bilaterally. Psych: Normal affect and thought content  Time Spent: 35 minutes of total time was spent on the date of the encounter  performing the following actions: chart review prior to seeing the patient, obtaining history, performing a medically necessary exam, counseling on the treatment plan, placing orders, and documenting in our EHR.        Jinny Mounts. Daneil Dunker, MD 12/04/2023 3:03 PM

## 2023-12-04 NOTE — Patient Instructions (Signed)
 It was very nice to see you today!  We will check an echocardiogram and a heart monitor.  If you have a recurrence of symptoms please start the Zofran .  You can check your blood pressure if this happens again. Try eating a candy bar.   Return if symptoms worsen or fail to improve.   Take care, Dr Daneil Dunker  PLEASE NOTE:  If you had any lab tests, please let us  know if you have not heard back within a few days. You may see your results on mychart before we have a chance to review them but we will give you a call once they are reviewed by us .   If we ordered any referrals today, please let us  know if you have not heard from their office within the next week.   If you had any urgent prescriptions sent in today, please check with the pharmacy within an hour of our visit to make sure the prescription was transmitted appropriately.   Please try these tips to maintain a healthy lifestyle:  Eat at least 3 REAL meals and 1-2 snacks per day.  Aim for no more than 5 hours between eating.  If you eat breakfast, please do so within one hour of getting up.   Each meal should contain half fruits/vegetables, one quarter protein, and one quarter carbs (no bigger than a computer mouse)  Cut down on sweet beverages. This includes juice, soda, and sweet tea.   Drink at least 1 glass of water with each meal and aim for at least 8 glasses per day  Exercise at least 150 minutes every week.

## 2023-12-07 ENCOUNTER — Other Ambulatory Visit: Payer: Self-pay | Admitting: Family Medicine

## 2023-12-08 ENCOUNTER — Encounter: Payer: Self-pay | Admitting: Family Medicine

## 2023-12-08 NOTE — Telephone Encounter (Signed)
 I appreciate the update.  We can scan in results.  I agree with his testosterone  result likely not being accurate.  We can recheck here when it is convenient for him.  We will contact him once we get results back on his echocardiogram and heart monitor test.  He should let us  know if he has had any change in symptoms since our last visit.

## 2023-12-08 NOTE — Telephone Encounter (Signed)
 Results abstracted on 12/08/2023 Printed and placed to be scan in patient chart

## 2023-12-08 NOTE — Telephone Encounter (Signed)
 See note

## 2023-12-09 ENCOUNTER — Ambulatory Visit: Attending: Family Medicine

## 2023-12-09 DIAGNOSIS — R42 Dizziness and giddiness: Secondary | ICD-10-CM

## 2023-12-09 NOTE — Progress Notes (Unsigned)
 EP to read.

## 2023-12-10 ENCOUNTER — Ambulatory Visit (HOSPITAL_COMMUNITY)

## 2023-12-14 ENCOUNTER — Telehealth: Payer: Self-pay | Admitting: *Deleted

## 2023-12-14 ENCOUNTER — Other Ambulatory Visit: Payer: Self-pay | Admitting: Physician Assistant

## 2023-12-14 DIAGNOSIS — L409 Psoriasis, unspecified: Secondary | ICD-10-CM

## 2023-12-14 DIAGNOSIS — L4050 Arthropathic psoriasis, unspecified: Secondary | ICD-10-CM

## 2023-12-14 NOTE — Telephone Encounter (Signed)
**Note De-identified  [place] Obfuscation** Please advise 

## 2023-12-14 NOTE — Telephone Encounter (Signed)
 See note

## 2023-12-14 NOTE — Telephone Encounter (Signed)
 Last Fill: 11/23/2023   Labs: 11/26/2023 CBC and CMP WNL    TB Gold: 11/26/2023 TB gold negative     Next Visit: 02/22/2024   Last Visit: 09/22/2023   [id number] arthritis    Current Dose per office note 09/22/2023: Taltz  80 mg sq injections every 4 weeks      Okay to refill Taltz ?

## 2023-12-14 NOTE — Telephone Encounter (Signed)
 It is unlikely but yes we can check for lyme disease via blood work.  Jinny Mounts. Daneil Dunker, MD 12/14/2023 3:00 PM

## 2023-12-14 NOTE — Telephone Encounter (Signed)
 Copied from CRM [id number]. Topic: Referral - Prior Authorization Question >> Dec 10, 2023  3:34 PM Clyde Darling P wrote: Reason for CRM: Amber from [facility] advise they do not have the authorization to complete the echocardiogram, pt would have to reschedule- need to have authorization sent.   Echocardiogram  completed  Estefana Taylor,RMA

## 2023-12-15 NOTE — Telephone Encounter (Signed)
 Noted. We will contact him once we receive results.  Jinny Mounts. Daneil Dunker, MD 12/15/2023 7:34 AM

## 2023-12-17 NOTE — Telephone Encounter (Signed)
 Spoke with patient, per note PA approved  Patient has an appt on 12/25/2023

## 2023-12-21 ENCOUNTER — Other Ambulatory Visit (HOSPITAL_COMMUNITY)

## 2023-12-25 ENCOUNTER — Ambulatory Visit (HOSPITAL_COMMUNITY)

## 2024-01-12 ENCOUNTER — Other Ambulatory Visit: Payer: Self-pay | Admitting: Family Medicine

## 2024-01-12 ENCOUNTER — Ambulatory Visit: Payer: Self-pay | Admitting: Family Medicine

## 2024-01-12 ENCOUNTER — Encounter: Payer: Self-pay | Admitting: Orthopaedic Surgery

## 2024-01-12 ENCOUNTER — Other Ambulatory Visit (HOSPITAL_BASED_OUTPATIENT_CLINIC_OR_DEPARTMENT_OTHER): Payer: Self-pay

## 2024-01-12 DIAGNOSIS — M1611 Unilateral primary osteoarthritis, right hip: Secondary | ICD-10-CM

## 2024-01-12 DIAGNOSIS — M25551 Pain in right hip: Secondary | ICD-10-CM

## 2024-01-12 DIAGNOSIS — R42 Dizziness and giddiness: Secondary | ICD-10-CM | POA: Diagnosis not present

## 2024-01-12 NOTE — Progress Notes (Signed)
 His heart monitor did not show any concerning findings.  He does have a few occasional extra beats in his heart however these are benign and not causing him any issues.  Can we check on the status of his echocardiogram order?  We ordered this a few weeks ago but does not look like it has been done yet.

## 2024-01-25 ENCOUNTER — Ambulatory Visit: Admitting: Sports Medicine

## 2024-01-25 ENCOUNTER — Other Ambulatory Visit: Payer: Self-pay

## 2024-01-25 ENCOUNTER — Encounter: Payer: Self-pay | Admitting: Sports Medicine

## 2024-01-25 DIAGNOSIS — M25551 Pain in right hip: Secondary | ICD-10-CM | POA: Diagnosis not present

## 2024-01-25 DIAGNOSIS — M1611 Unilateral primary osteoarthritis, right hip: Secondary | ICD-10-CM | POA: Diagnosis not present

## 2024-01-25 MED ORDER — LIDOCAINE HCL 1 % IJ SOLN
4.0000 mL | INTRAMUSCULAR | Status: AC | PRN
  Administered 2024-01-25: 4 mL

## 2024-01-25 MED ORDER — METHYLPREDNISOLONE ACETATE 40 MG/ML IJ SUSP
80.0000 mg | INTRAMUSCULAR | Status: AC | PRN
  Administered 2024-01-25: 80 mg via INTRA_ARTICULAR

## 2024-01-25 NOTE — Progress Notes (Signed)
   Procedure Note  Patient: Jeffrey Klein             Date of Birth: 12/10/1960           MRN: [id number]             Visit Date: 01/25/2024  Procedures: Visit Diagnoses:  1. Unilateral primary osteoarthritis, right hip   2. Pain of right hip    Large Joint Inj: R hip joint on 01/25/2024 8:36 AM Indications: pain Details: 22 G 3.5 in needle, ultrasound-guided anterior approach Medications: 4 mL lidocaine  1 %; 80 mg methylPREDNISolone  acetate 40 MG/ML Outcome: tolerated well, no immediate complications  Procedure: US -guided intra-articular hip injection, Right After discussion on risks/benefits/indications and informed verbal consent was obtained, a timeout was performed. Patient was lying supine on exam table. The hip was cleaned with betadine and alcohol swabs. Then utilizing ultrasound guidance, the patient's femoral head and neck junction was identified and subsequently injected with 4:2 lidocaine :depomedrol via an in-plane approach with ultrasound visualization of the injectate administered into the hip joint. Patient tolerated procedure well without immediate complications.  Procedure, treatment alternatives, risks and benefits explained, specific risks discussed. Consent was given by the patient. Immediately prior to procedure a time out was called to verify the correct patient, procedure, equipment, support staff and site/side marked as required. Patient was prepped and draped in the usual sterile fashion.     - patient tolerated procedure well, discussed post-injection protocol - follow-up with Dr. Vernetta as indicated; I am happy to see them as needed  Lonell Sprang, DO Primary Care Sports Medicine Physician  [facility]  This note was dictated using Dragon naturally speaking software and may contain errors in syntax, spelling, or content which have not been identified prior to signing this note.

## 2024-02-04 ENCOUNTER — Encounter: Payer: 59 | Admitting: Family Medicine

## 2024-02-08 NOTE — Progress Notes (Deleted)
 Office Visit Note  Patient: Jeffrey Klein             Date of Birth: 1960-11-26           MRN: [id number]             PCP: Kennyth Worth HERO, MD Referring: Kennyth Worth HERO, MD Visit Date: 02/22/2024 Occupation: @GUAROCC @  Subjective:    History of Present Illness: Jong Rickman is a 63 y.o. male with history of psoriatic arthritis and osteoarthritis.  Patient remains on  Taltz  80 mg sq injections every 4 weeks   CBC and CMP updated on 11/26/23. Orders for CBC and CMP released today. TB gold negative on 11/26/23.  Discussed the importance of holding taltz  if he develops signs or symptoms of an infection and to resume once the infection has completely cleared.     Activities of Daily Living:  Patient reports morning stiffness for *** {minute/hour:19697}.   Patient {ACTIONS;DENIES/REPORTS:21021675::Denies} nocturnal pain.  Difficulty dressing/grooming: {ACTIONS;DENIES/REPORTS:21021675::Denies} Difficulty climbing stairs: {ACTIONS;DENIES/REPORTS:21021675::Denies} Difficulty getting out of chair: {ACTIONS;DENIES/REPORTS:21021675::Denies} Difficulty using hands for taps, buttons, cutlery, and/or writing: {ACTIONS;DENIES/REPORTS:21021675::Denies}  No Rheumatology ROS completed.   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   Gout 11/18/2019   Dyslipidemia 11/18/2019   Low testosterone  11/18/2019   Diverticulosis 11/18/2019   Psoriatic arthritis (HCC) 11/18/2019    Past Medical History:  Diagnosis Date    Diverticulosis    diverticulitis in 2018, no recurrence   GERD (gastroesophageal reflux disease)    High cholesterol    Psoriasis    Psoriatic arthritis (HCC)     Family History  Problem Relation Age of Onset   Psoriasis Sister    Psoriasis Paternal Grandmother    Healthy Daughter    ADD / ADHD Son    Drug abuse Son    Colon cancer Neg Hx    Esophageal cancer Neg Hx    Rectal cancer Neg Hx    Stomach cancer Neg Hx    Past Surgical History:  Procedure Laterality Date   COLONOSCOPY  08/29/2016   [facility]. Dr. Rolan [place]: Single 5 mm transverse colon adenoma, sigmoid diverticulosis.  Repeat 5 years   dental surgeries     KNEE ARTHROSCOPY Bilateral    LASIK Bilateral 2006   TONSILLECTOMY     as a child    TOOTH EXTRACTION     WRIST SURGERY Right    Social History   Social History Narrative   Not on file   Immunization History  Administered Date(s) Administered   Influenza Inj Mdck Quad Pf 05/16/2022   Influenza, Seasonal, Injecte, Preservative Fre 04/06/2023   Influenza,inj,Quad PF,6-35 Mos 05/19/2022   Influenza-Unspecified 02/21/2021, 05/06/2023   PFIZER(Purple Top)SARS-COV-2 Vaccination 10/06/2019, 10/31/2019, 03/05/2020   PNEUMOCOCCAL CONJUGATE-20 08/04/2023   Pneumococcal-Unspecified 08/19/2018   Tdap 11/19/2020   Zoster Recombinant(Shingrix) 04/05/2019, 06/17/2019     Objective: Vital Signs: There were no vitals taken for this visit.   Physical Exam Vitals and nursing note reviewed.  Constitutional:      Appearance: He is well-developed.  HENT:     Head: Normocephalic and atraumatic.  Eyes:     Conjunctiva/sclera: Conjunctivae normal.     Pupils: Pupils are equal, round, and reactive to light.  Cardiovascular:     Rate and Rhythm: Normal rate and regular rhythm.     Heart sounds: Normal heart sounds.  Pulmonary:     Effort: Pulmonary effort is normal.     Breath sounds: Normal breath sounds.  Abdominal:     General:  Bowel sounds are normal.     Palpations: Abdomen is soft.  Musculoskeletal:     Cervical back: Normal range of motion and neck supple.  Skin:    General: Skin is warm and dry.     Capillary Refill: Capillary refill takes less than 2 seconds.  Neurological:     Mental Status: He is alert and oriented to person, place, and time.  Psychiatric:        Behavior: Behavior normal.      Musculoskeletal Exam: ***  CDAI Exam: CDAI Score: -- Patient Global: --; Provider Global: -- Swollen: --; Tender: -- Joint Exam 02/22/2024   No joint exam has been documented for this visit   There is currently no information documented on the homunculus. Go to the Rheumatology activity and complete the homunculus joint exam.  Investigation: No additional findings.  Imaging: LONG TERM MONITOR (3-14 DAYS) Result Date: 01/12/2024 3 day monitor HR 44 - 121, average 78 bpm. No atrial fibrillation detected. Rare supraventricular ectopy. Rare ventricular ectopy. No sustained arrhythmias. Symptom trigger episodes correspond to sinus rhythm  Eulas BRAVO Mealor Cardiac Electrophysiology   Recent Labs: Lab Results  Component Value Date   WBC 5.8 11/26/2023   HGB 15.2 11/26/2023   PLT 348 11/26/2023   NA 140 11/26/2023   K 4.6 11/26/2023   CL 103 11/26/2023   CO2 30 11/26/2023   GLUCOSE 93 11/26/2023   BUN 19 11/26/2023   CREATININE 0.96 11/26/2023   BILITOT 0.6 11/26/2023   ALKPHOS 38 (L) 08/04/2023   AST 21 11/26/2023   ALT 27 11/26/2023   PROT 6.9 11/26/2023   ALBUMIN 4.8 08/04/2023   CALCIUM  9.7 11/26/2023   GFRAA 107 08/16/2020   QFTBGOLDPLUS NEGATIVE 11/26/2023    Speciality Comments: Inadequate response to Enbrel, Stelara, Cosentyx. He has been on Taltz  for 6 years.  Procedures:  No procedures performed Allergies: Patient has no known allergies.   Assessment / Plan:     Visit Diagnoses: Psoriatic arthritis (HCC)  Psoriasis  High risk medication use  Primary osteoarthritis of  both hands  Primary osteoarthritis of both hips  Primary osteoarthritis of both knees  Primary osteoarthritis of both feet  Idiopathic chronic gout without tophus, unspecified site  Chronic right shoulder pain  Family history of psoriasis in sister  Hyperlipidemia, mixed  History of diverticulosis  Selective IgM deficiency (HCC)  Orders: No orders of the defined types were placed in this encounter.  No orders of the defined types were placed in this encounter.   Face-to-face time spent with patient was *** minutes. Greater than 50% of time was spent in counseling and coordination of care.  Follow-Up Instructions: No follow-ups on file.   Waddell CHRISTELLA Craze, PA-C  Note - This record has been created using Dragon software.  Chart creation errors have been sought, but may not always  have been located. Such creation errors do not reflect on  the standard of medical care.

## 2024-02-22 ENCOUNTER — Ambulatory Visit: Admitting: Physician Assistant

## 2024-02-22 DIAGNOSIS — D804 Selective deficiency of immunoglobulin M [IgM]: Secondary | ICD-10-CM

## 2024-02-22 DIAGNOSIS — L4050 Arthropathic psoriasis, unspecified: Secondary | ICD-10-CM

## 2024-02-22 DIAGNOSIS — Z840 Family history of diseases of the skin and subcutaneous tissue: Secondary | ICD-10-CM

## 2024-02-22 DIAGNOSIS — M19041 Primary osteoarthritis, right hand: Secondary | ICD-10-CM

## 2024-02-22 DIAGNOSIS — Z8719 Personal history of other diseases of the digestive system: Secondary | ICD-10-CM

## 2024-02-22 DIAGNOSIS — M1A00X0 Idiopathic chronic gout, unspecified site, without tophus (tophi): Secondary | ICD-10-CM

## 2024-02-22 DIAGNOSIS — Z79899 Other long term (current) drug therapy: Secondary | ICD-10-CM

## 2024-02-22 DIAGNOSIS — E782 Mixed hyperlipidemia: Secondary | ICD-10-CM

## 2024-02-22 DIAGNOSIS — M160 Bilateral primary osteoarthritis of hip: Secondary | ICD-10-CM

## 2024-02-22 DIAGNOSIS — G8929 Other chronic pain: Secondary | ICD-10-CM

## 2024-02-22 DIAGNOSIS — M170 Bilateral primary osteoarthritis of knee: Secondary | ICD-10-CM

## 2024-02-22 DIAGNOSIS — M19071 Primary osteoarthritis, right ankle and foot: Secondary | ICD-10-CM

## 2024-02-22 DIAGNOSIS — L409 Psoriasis, unspecified: Secondary | ICD-10-CM

## 2024-02-26 NOTE — Progress Notes (Signed)
 Office Visit Note  Patient: Jeffrey Klein             Date of Birth: 01-16-1961           MRN: [id number]             PCP: Kennyth Worth HERO, MD Referring: Kennyth Worth HERO, MD Visit Date: 02/29/2024 Occupation: @GUAROCC @  Subjective:  Medication monitoring  History of Present Illness: Jeffrey Klein is a 63 y.o. male with history of psoriatic arthritis and osteoarthritis.  Patient remains on Taltz  80 mg sq injections every 4 weeks.  He denies any signs or symptoms of a psoriatic arthritis flare.  He continues to have some psoriasis in the umbilical region but denies any other new patches.  Patient states that his symptoms have been most severe involving the right hip.  He is hoping to have the right hip replaced in November 2025 by Dr. Vernetta.  He is waiting to transition to Medicare.  Patient states that he has been more sedentary than usual due to the severity of symptoms.  He has also been having nocturnal pain interrupting his sleep which has been interfering with his quality of life.  Patient has also been having increased discomfort in both knee joints especially the left knee.  He has limited flexion of the left knee and has had difficulty ambulating due to the gait changes from his right hip pain.  He denies any Achilles tendinitis or plantar fasciitis at this time.  He denies any recent or recurrent infections.  Activities of Daily Living:  Patient reports morning stiffness for all day. Patient Reports nocturnal pain.  Difficulty dressing/grooming: Reports Difficulty climbing stairs: Reports Difficulty getting out of chair: Reports Difficulty using hands for taps, buttons, cutlery, and/or writing: Denies  Review of Systems  Constitutional:  Positive for fatigue.  HENT:  Negative for mouth sores and mouth dryness.   Eyes:  Positive for pain. Negative for redness and dryness.  Respiratory:  Negative for shortness of breath.   Cardiovascular:  Negative for chest pain and  palpitations.  Gastrointestinal:  Negative for blood in stool, constipation and diarrhea.  Endocrine: Negative for increased urination.  Genitourinary:  Negative for involuntary urination.  Musculoskeletal:  Positive for joint pain, gait problem, joint pain, joint swelling, myalgias, morning stiffness and myalgias. Negative for muscle weakness and muscle tenderness.  Skin:  Negative for color change, rash, hair loss and sensitivity to sunlight.  Allergic/Immunologic: Negative for susceptible to infections.  Neurological:  Negative for dizziness and headaches.  Hematological:  Negative for swollen glands.  Psychiatric/Behavioral:  Positive for sleep disturbance. Negative for depressed mood. The patient is not nervous/anxious.     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   Gout 11/18/2019   Dyslipidemia 11/18/2019   Low testosterone  11/18/2019   Diverticulosis 11/18/2019   Psoriatic arthritis (HCC) 11/18/2019    Past Medical History:  Diagnosis Date   Diverticulosis    diverticulitis in 2018, no recurrence   GERD (gastroesophageal reflux disease)    High cholesterol  Psoriasis    Psoriatic arthritis (HCC)     Family History  Problem Relation Age of Onset   Psoriasis Sister    Psoriasis Paternal Grandmother    Healthy Daughter    ADD / ADHD Son    Drug abuse Son    Colon cancer Neg Hx    Esophageal cancer Neg Hx    Rectal cancer Neg Hx    Stomach cancer Neg Hx    Past Surgical History:  Procedure Laterality Date    COLONOSCOPY  08/29/2016   [facility]. Dr. Rolan Bend: Single 5 mm transverse colon adenoma, sigmoid diverticulosis.  Repeat 5 years   dental surgeries     KNEE ARTHROSCOPY Bilateral    LASIK Bilateral 2006   TONSILLECTOMY     as a child    TOOTH EXTRACTION     WRIST SURGERY Right    Social History   Social History Narrative   Not on file   Immunization History  Administered Date(s) Administered   Influenza Inj Mdck Quad Pf 05/16/2022   Influenza, Seasonal, Injecte, Preservative Fre 04/06/2023   Influenza,inj,Quad PF,6-35 Mos 05/19/2022   Influenza-Unspecified 02/21/2021, 05/06/2023   PFIZER(Purple Top)SARS-COV-2 Vaccination 10/06/2019, 10/31/2019, 03/05/2020   PNEUMOCOCCAL CONJUGATE-20 08/04/2023   Pneumococcal-Unspecified 08/19/2018   Tdap 11/19/2020   Zoster Recombinant(Shingrix) 04/05/2019, 06/17/2019     Objective: Vital Signs: BP (!) 150/79 (BP Location: Left Arm, Patient Position: Sitting, Cuff Size: Normal)   Pulse 81   Resp 16   Ht 5' 10.5 (1.791 m)   Wt 230 lb 3.2 oz (104.4 kg)   BMI 32.56 kg/m    Physical Exam Vitals and nursing note reviewed.  Constitutional:      Appearance: He is well-developed.  HENT:     Head: Normocephalic and atraumatic.  Eyes:     Conjunctiva/sclera: Conjunctivae normal.     Pupils: Pupils are equal, round, and reactive to light.  Cardiovascular:     Rate and Rhythm: Normal rate and regular rhythm.     Heart sounds: Normal heart sounds.  Pulmonary:     Effort: Pulmonary effort is normal.     Breath sounds: Normal breath sounds.  Abdominal:     General: Bowel sounds are normal.     Palpations: Abdomen is soft.  Musculoskeletal:     Cervical back: Normal range of motion and neck supple.  Skin:    General: Skin is warm and dry.     Capillary Refill: Capillary refill takes less than 2 seconds.  Neurological:     Mental Status: He is alert and oriented to person, place, and time.  Psychiatric:         Behavior: Behavior normal.      Musculoskeletal Exam: C-spine, thoracic spine, lumbar spine have good range of motion.  Both shoulders have good range of motion with no discomfort currently.  Elbow joints have good range of motion no tenderness along the joint line.  Limited extension of the right wrist.  PIP and DIP thickening consistent with osteoarthritis of both hands.  Painful and limited mobility of the right hip joint.  Limited flexion of the left knee.  Mild pedal edema noted bilateral lower extremities, left more severe than right.  No evidence of Achilles tendinitis.  CDAI Exam: CDAI Score: -- Patient Global: --; Provider Global: -- Swollen: --; Tender: -- Joint Exam 02/29/2024   No joint exam has been documented for this visit   There is currently no information documented on the homunculus. Go  to the Rheumatology activity and complete the homunculus joint exam.  Investigation: No additional findings.  Imaging: No results found.   Recent Labs: Lab Results  Component Value Date   WBC 5.8 11/26/2023   HGB 15.2 11/26/2023   PLT 348 11/26/2023   NA 140 11/26/2023   K 4.6 11/26/2023   CL 103 11/26/2023   CO2 30 11/26/2023   GLUCOSE 93 11/26/2023   BUN 19 11/26/2023   CREATININE 0.96 11/26/2023   BILITOT 0.6 11/26/2023   ALKPHOS 38 (L) 08/04/2023   AST 21 11/26/2023   ALT 27 11/26/2023   PROT 6.9 11/26/2023   ALBUMIN 4.8 08/04/2023   CALCIUM  9.7 11/26/2023   GFRAA 107 08/16/2020   QFTBGOLDPLUS NEGATIVE 11/26/2023    Speciality Comments: Inadequate response to Enbrel, Stelara, Cosentyx. He has been on Taltz  for 6 years.  Procedures:  No procedures performed Allergies: Patient has no known allergies.      Assessment / Plan:     Visit Diagnoses: Psoriatic arthritis (HCC): He has no synovitis or dactylitis on examination today.  He has been experiencing severe pain in the right hip as well as gait abnormality.  He is having increased discomfort in  both knees which he attributes to the change in gait.  He is hoping to have the right hip replaced in November 2025 once he has transitioned to [facility].  He has been experiencing nocturnal pain and difficulty ambulating which has been interfering with his quality of life. He has remained on Taltz  80 mg subcutaneous injections once every 4 weeks.  He continues to tolerate Taltz  without any side effects or injection site reactions.  He continues to find Taltz  to be effective at managing his symptoms from psoriatic arthritis.  No evidence of Achilles tendinitis or plantar fasciitis.  No uveitis.  No new patches of psoriasis.  Patient will remain on Taltz  as prescribed.  He is currently in the process of picking and Medicare advantage plan. He will follow-up in the office in 5 months or sooner if needed.  Psoriasis - Diagnosed in 1985. Umbilical region. No new patches.  He has a prescription for betamethasone  valerate 0.1% cream which he applies topically as needed.  He will remain on Taltz  as prescribed.  High risk medication use - Taltz  80 mg sq injections every 4 weeks-started 09/23/16-initially prescribed by rheum in [place] .  Inadequate response to Enbrel, Stelara, and Cosentyx.  CBC and CMP updated on 11/26/23. Orders for CBC and CMP released today. TB gold negative on 11/26/23.  Discussed the importance of holding taltz  if he develops signs or symptoms of an infection and to resume once the infection has completely cleared.  - Plan: CBC with Differential/Platelet, Comprehensive metabolic panel with GFR  Primary osteoarthritis of both hands: PIP and DIP thickening consistent with osteoarthritis of both hands.  No synovitis noted.  No dactylitis noted.  Complete fist formation bilaterally.  Primary osteoarthritis of both hips: X-rays of both hips were obtained on 09/22/2023 which were consistent with severe osteoarthritis of the right hip.  SI joint narrowing was noted most likely related to  underlying psoriatic arthritis. He has been experiencing significant discomfort in the right hip.  He is planning to proceed with a right hip replacement once he has transition to Medicare-hopefully in November 2025.  He has been more sedentary than usual due to the severity of symptoms.  He is also been experiencing nocturnal pain causing interrupted sleep at night.  His quality of life has been significantly  impacted by the severity of pain and gait change from the arthritis in his right hip. He has not had an intra-articular hip injection and does not plan to due to previously having multiple steroid injections in other joints.  Primary osteoarthritis of both knees - X-rays 05/2020: Left knee: severe osteoarthritis, severe chondromalacia patella and the right knee reveals mild osteoarthritis, moderate chondromalacia patella. Chronic pain bilaterally.   He has limited more flexion of the right send prescription left knee. Difficulty ambulating due to the severity of pain in the right hip.   Primary osteoarthritis of both feet: Mild pedal edema noted in bilateral lower extremities, left greater than right.  No evidence of Achilles tendinitis or plantar fasciitis.  Idiopathic chronic gout without tophus, unspecified site - He not had any signs or symptoms of a gout flare.  He remains on allopurinol  100 mg daily prescribed by Dr. Kennyth. uric acid: 6.9 on 02/02/2023.  Chronic right shoulder pain - Under care of Dr. Leonce.  Fall on 01/08/2023.  X-rays performed on 01/15/2023.  Did not require surgical intervention.  He has occasional discomfort in the right shoulder but overall his symptoms have subsided.  He has good range of motion on examination today.  Other medical conditions are listed as follows:  Family history of psoriasis in sister  Hyperlipidemia, mixed: Lipid panel attained on 11/30/2023.  Selective IgM deficiency (HCC)  History of diverticulosis  Orders: Orders Placed This Encounter   Procedures   CBC with Differential/Platelet   Comprehensive metabolic panel with GFR   No orders of the defined types were placed in this encounter.    Follow-Up Instructions: Return in about 5 months (around 07/31/2024).   Waddell CHRISTELLA Craze, PA-C  Note - This record has been created using Dragon software.  Chart creation errors have been sought, but may not always  have been located. Such creation errors do not reflect on  the standard of medical care.

## 2024-02-29 ENCOUNTER — Ambulatory Visit: Attending: Physician Assistant | Admitting: Physician Assistant

## 2024-02-29 ENCOUNTER — Encounter: Payer: Self-pay | Admitting: Physician Assistant

## 2024-02-29 VITALS — BP 150/79 | HR 81 | Resp 16 | Ht 70.5 in | Wt 230.2 lb

## 2024-02-29 DIAGNOSIS — M170 Bilateral primary osteoarthritis of knee: Secondary | ICD-10-CM

## 2024-02-29 DIAGNOSIS — M19071 Primary osteoarthritis, right ankle and foot: Secondary | ICD-10-CM

## 2024-02-29 DIAGNOSIS — M19042 Primary osteoarthritis, left hand: Secondary | ICD-10-CM

## 2024-02-29 DIAGNOSIS — G8929 Other chronic pain: Secondary | ICD-10-CM

## 2024-02-29 DIAGNOSIS — Z840 Family history of diseases of the skin and subcutaneous tissue: Secondary | ICD-10-CM

## 2024-02-29 DIAGNOSIS — Z79899 Other long term (current) drug therapy: Secondary | ICD-10-CM | POA: Diagnosis not present

## 2024-02-29 DIAGNOSIS — L409 Psoriasis, unspecified: Secondary | ICD-10-CM

## 2024-02-29 DIAGNOSIS — M160 Bilateral primary osteoarthritis of hip: Secondary | ICD-10-CM

## 2024-02-29 DIAGNOSIS — M1A00X0 Idiopathic chronic gout, unspecified site, without tophus (tophi): Secondary | ICD-10-CM

## 2024-02-29 DIAGNOSIS — M19041 Primary osteoarthritis, right hand: Secondary | ICD-10-CM

## 2024-02-29 DIAGNOSIS — Z8719 Personal history of other diseases of the digestive system: Secondary | ICD-10-CM

## 2024-02-29 DIAGNOSIS — L4050 Arthropathic psoriasis, unspecified: Secondary | ICD-10-CM | POA: Diagnosis not present

## 2024-02-29 DIAGNOSIS — M25511 Pain in right shoulder: Secondary | ICD-10-CM

## 2024-02-29 DIAGNOSIS — E782 Mixed hyperlipidemia: Secondary | ICD-10-CM

## 2024-02-29 DIAGNOSIS — M19072 Primary osteoarthritis, left ankle and foot: Secondary | ICD-10-CM

## 2024-02-29 DIAGNOSIS — D804 Selective deficiency of immunoglobulin M [IgM]: Secondary | ICD-10-CM

## 2024-02-29 NOTE — Progress Notes (Signed)
 Patient is switching to Medicare in Nov 2025 and anticipating orthopedic surgery immediately after if possible.  He is currently on Taltz  (has been on treatment for 5 years) and interested to know how coverage will change. Reviewed the $2000 OOP max through Medicare.  Advised him to collect Taltz  doses this year while with commercial insurance.  He was provided with [employer] pt assistance application and is confident that he does qualify for household of one. He is going to work on application and return the application to the office with updated insurance card copies once he uses his last Taltz  dose  Jeffrey Klein, PharmD, MPH, BCPS, CPP Clinical Pharmacist (Rheumatology and Pulmonology)

## 2024-02-29 NOTE — Patient Instructions (Signed)
 Please return the Taltz  patient assistance application to the clinic once you've used your last Taltz  dose. The application takes 2 weeks to process. Will need updated insurance card copies as well

## 2024-03-01 ENCOUNTER — Ambulatory Visit: Payer: Self-pay | Admitting: Physician Assistant

## 2024-03-01 ENCOUNTER — Encounter: Payer: Self-pay | Admitting: Family Medicine

## 2024-03-01 ENCOUNTER — Other Ambulatory Visit: Payer: Self-pay

## 2024-03-01 DIAGNOSIS — L409 Psoriasis, unspecified: Secondary | ICD-10-CM

## 2024-03-01 DIAGNOSIS — L4050 Arthropathic psoriasis, unspecified: Secondary | ICD-10-CM

## 2024-03-01 LAB — COMPREHENSIVE METABOLIC PANEL WITH GFR
AG Ratio: 1.9 (calc) (ref 1.0–2.5)
ALT: 29 U/L (ref 9–46)
AST: 25 U/L (ref 10–35)
Albumin: 4.5 g/dL (ref 3.6–5.1)
Alkaline phosphatase (APISO): 44 U/L (ref 35–144)
BUN: 16 mg/dL (ref 7–25)
CO2: 29 mmol/L (ref 20–32)
Calcium: 9.3 mg/dL (ref 8.6–10.3)
Chloride: 104 mmol/L (ref 98–110)
Creat: 1 mg/dL (ref 0.70–1.35)
Globulin: 2.4 g/dL (ref 1.9–3.7)
Glucose, Bld: 91 mg/dL (ref 65–99)
Potassium: 4.3 mmol/L (ref 3.5–5.3)
Sodium: 141 mmol/L (ref 135–146)
Total Bilirubin: 0.5 mg/dL (ref 0.2–1.2)
Total Protein: 6.9 g/dL (ref 6.1–8.1)
eGFR: 84 mL/min/1.73m2 (ref >=60)

## 2024-03-01 LAB — CBC WITH DIFFERENTIAL/PLATELET
Absolute Lymphocytes: 1817 {cells}/uL (ref 850–3900)
Absolute Monocytes: 690 {cells}/uL (ref 200–950)
Basophils Absolute: 30 {cells}/uL (ref 0–200)
Basophils Relative: 0.5 %
Eosinophils Absolute: 153 {cells}/uL (ref 15–500)
Eosinophils Relative: 2.6 %
HCT: 44.1 % (ref 38.5–50.0)
Hemoglobin: 14.8 g/dL (ref 13.2–17.1)
MCH: 30.1 pg (ref 27.0–33.0)
MCHC: 33.6 g/dL (ref 32.0–36.0)
MCV: 89.6 fL (ref 80.0–100.0)
MPV: 9.5 fL (ref 7.5–12.5)
Monocytes Relative: 11.7 %
Neutro Abs: 3210 {cells}/uL (ref 1500–7800)
Neutrophils Relative %: 54.4 %
Platelets: 337 Thousand/uL (ref 140–400)
RBC: 4.92 Million/uL (ref 4.20–5.80)
RDW: 13.1 % (ref 11.0–15.0)
Total Lymphocyte: 30.8 %
WBC: 5.9 Thousand/uL (ref 3.8–10.8)

## 2024-03-01 MED ORDER — TALTZ 80 MG/ML ~~LOC~~ SOSY
PREFILLED_SYRINGE | SUBCUTANEOUS | 0 refills | Status: DC
Start: 2024-03-01 — End: 2024-05-17

## 2024-03-01 NOTE — Progress Notes (Signed)
 CBC and CMP WNL

## 2024-03-01 NOTE — Telephone Encounter (Signed)
 Patient contacted the office and states [facility] contacted him and advised he did not have any refills of his Taltz . Patient contacted our office to have refills of his Taltz  sent to [facility].   Last Fill: 12/14/2023  Labs: 02/29/2024 CBC and CMP WNL   TB Gold: 11/26/2023 TB gold negative   Next Visit: 08/09/2024  Last Visit: 02/29/2024  DX: Psoriatic arthritis (HCC)   Current Dose per office note 02/29/2024: Taltz  80 mg sq injections every 4 weeks   Okay to refill Taltz ?

## 2024-03-02 NOTE — Telephone Encounter (Signed)
 Please schedule an office visit with PCP form possible stye on left eye

## 2024-03-03 NOTE — Telephone Encounter (Signed)
 Pt stated getting better after using warm compress. Advised pt to schedule ov is it gets worse.

## 2024-03-08 ENCOUNTER — Encounter: Payer: Self-pay | Admitting: Family Medicine

## 2024-03-08 ENCOUNTER — Ambulatory Visit (INDEPENDENT_AMBULATORY_CARE_PROVIDER_SITE_OTHER): Admitting: Family Medicine

## 2024-03-08 VITALS — BP 139/86 | HR 80 | Temp 97.3°F | Ht 70.5 in | Wt 228.2 lb

## 2024-03-08 DIAGNOSIS — R7303 Prediabetes: Secondary | ICD-10-CM | POA: Diagnosis not present

## 2024-03-08 DIAGNOSIS — E785 Hyperlipidemia, unspecified: Secondary | ICD-10-CM | POA: Diagnosis not present

## 2024-03-08 DIAGNOSIS — D804 Selective deficiency of immunoglobulin M [IgM]: Secondary | ICD-10-CM

## 2024-03-08 DIAGNOSIS — M1000 Idiopathic gout, unspecified site: Secondary | ICD-10-CM | POA: Diagnosis not present

## 2024-03-08 DIAGNOSIS — Z23 Encounter for immunization: Secondary | ICD-10-CM | POA: Diagnosis not present

## 2024-03-08 DIAGNOSIS — Z0001 Encounter for general adult medical examination with abnormal findings: Secondary | ICD-10-CM | POA: Diagnosis not present

## 2024-03-08 DIAGNOSIS — E559 Vitamin D deficiency, unspecified: Secondary | ICD-10-CM | POA: Diagnosis not present

## 2024-03-08 DIAGNOSIS — L4050 Arthropathic psoriasis, unspecified: Secondary | ICD-10-CM

## 2024-03-08 DIAGNOSIS — M1611 Unilateral primary osteoarthritis, right hip: Secondary | ICD-10-CM

## 2024-03-08 DIAGNOSIS — R7989 Other specified abnormal findings of blood chemistry: Secondary | ICD-10-CM

## 2024-03-08 LAB — TSH: TSH: 1.04 u[IU]/mL (ref 0.35–5.50)

## 2024-03-08 LAB — HEMOGLOBIN A1C: Hgb A1c MFr Bld: 6.6 % — ABNORMAL HIGH (ref 4.6–6.5)

## 2024-03-08 LAB — COMPREHENSIVE METABOLIC PANEL WITH GFR
ALT: 31 U/L (ref 0–53)
AST: 27 U/L (ref 0–37)
Albumin: 4.5 g/dL (ref 3.5–5.2)
Alkaline Phosphatase: 41 U/L (ref 39–117)
BUN: 14 mg/dL (ref 6–23)
CO2: 29 meq/L (ref 19–32)
Calcium: 9.6 mg/dL (ref 8.4–10.5)
Chloride: 103 meq/L (ref 96–112)
Creatinine, Ser: 0.96 mg/dL (ref 0.40–1.50)
GFR: 83.38 mL/min (ref >60.00)
Glucose, Bld: 98 mg/dL (ref 70–99)
Potassium: 4.7 meq/L (ref 3.5–5.1)
Sodium: 142 meq/L (ref 135–145)
Total Bilirubin: 0.6 mg/dL (ref 0.2–1.2)
Total Protein: 7 g/dL (ref 6.0–8.3)

## 2024-03-08 LAB — CBC
HCT: 45.4 % (ref 39.0–52.0)
Hemoglobin: 14.8 g/dL (ref 13.0–17.0)
MCHC: 32.6 g/dL (ref 30.0–36.0)
MCV: 90.4 fl (ref 78.0–100.0)
Platelets: 341 K/uL (ref 150.0–400.0)
RBC: 5.02 Mil/uL (ref 4.22–5.81)
RDW: 14.1 % (ref 11.5–15.5)
WBC: 5.6 K/uL (ref 4.0–10.5)

## 2024-03-08 LAB — LIPID PANEL
Cholesterol: 193 mg/dL (ref 0–200)
HDL: 53.2 mg/dL (ref >39.00)
LDL Cholesterol: 121 mg/dL — ABNORMAL HIGH (ref 0–99)
NonHDL: 140.1
Total CHOL/HDL Ratio: 4
Triglycerides: 97 mg/dL (ref 0.0–149.0)
VLDL: 19.4 mg/dL (ref 0.0–40.0)

## 2024-03-08 LAB — VITAMIN D 25 HYDROXY (VIT D DEFICIENCY, FRACTURES): VITD: 47.12 ng/mL (ref 30.00–100.00)

## 2024-03-08 LAB — URIC ACID: Uric Acid, Serum: 7.8 mg/dL (ref 4.0–7.8)

## 2024-03-08 LAB — TESTOSTERONE: Testosterone: 222.45 ng/dL — ABNORMAL LOW (ref 300.00–890.00)

## 2024-03-08 LAB — PSA: PSA: 1.22 ng/mL (ref 0.10–4.00)

## 2024-03-08 MED ORDER — TESTOSTERONE 12.5 MG/ACT (1%) TD GEL: TRANSDERMAL | 0 refills | Status: AC

## 2024-03-08 MED ORDER — POLYMYXIN B-TRIMETHOPRIM 10000-0.1 UNIT/ML-% OP SOLN: 2.0000 [drp] | Freq: Four times a day (QID) | OPHTHALMIC | 0 refills | Status: DC

## 2024-03-08 MED ORDER — TESTOSTERONE 12.5 MG/ACT (1%) TD GEL: TRANSDERMAL | 0 refills | Status: DC

## 2024-03-08 MED ORDER — ATORVASTATIN CALCIUM 20 MG PO TABS: 20.0000 mg | ORAL_TABLET | Freq: Every day | ORAL | 3 refills | Status: AC

## 2024-03-08 NOTE — Assessment & Plan Note (Addendum)
Check lipids.  He is on Lipitor 20 mg daily.

## 2024-03-08 NOTE — Progress Notes (Signed)
 Chief Complaint:  Jeffrey Klein is a 63 y.o. male who presents today for his annual comprehensive physical exam.    Assessment/Plan:  New/Acute Problems: Stye No red flags.  Recommended warm compresses.  Will start Polytrim  drops.  He will let us  know if not improving in the next few weeks and may need referral to ophthalmology.  Chronic Problems Addressed Today: Gout Stable.  No recent flares.  Check uric acid level today.  On allopurinol  100 mg daily and tolerating well.  Dyslipidemia Check lipids.  He is on Lipitor 20 mg daily.  Low testosterone  On testosterone  2 pumps daily.  Check labs today including CBC, testosterone , and PSA.  Psoriatic arthritis (HCC) On Taltz  per rheumatology.  Selective IgM deficiency (HCC) Check labs.  Vitamin D  deficiency Check vitamin D .  Prediabetes Check A1c  Unilateral primary osteoarthritis, right hip Following with orthopedics.  Has upcoming hip replacement in a couple of months.  Preventative Healthcare: Flu vaccine given today.  Check labs.  Up-to-date on other vaccines and screenings.  Due for colonoscopy in 3 years.  Patient Counseling(The following topics were reviewed and/or handout was given):  -Nutrition: Stressed importance of moderation in sodium/caffeine intake, saturated fat and cholesterol, caloric balance, sufficient intake of fresh fruits, vegetables, and fiber.  -Stressed the importance of regular exercise.   -Substance Abuse: Discussed cessation/primary prevention of tobacco, alcohol, or other drug use; driving or other dangerous activities under the influence; availability of treatment for abuse.   -Injury prevention: Discussed safety belts, safety helmets, smoke detector, smoking near bedding or upholstery.   -Sexuality: Discussed sexually transmitted diseases, partner selection, use of condoms, avoidance of unintended pregnancy and contraceptive alternatives.   -Dental health: Discussed importance of regular tooth  brushing, flossing, and dental visits.  -Health maintenance and immunizations reviewed. Please refer to Health maintenance section.  Return to care in 1 year for next preventative visit.     Subjective:  HPI:  He has no acute complaints today. Patient is here today for his  annual physical.  See assessment / plan for status of chronic conditions.  Discussed the use of AI scribe software for clinical note transcription with the patient, who gave verbal consent to proceed.  History of Present Illness Jeffrey Klein is a 63 year old male who presents for an annual physical exam.  He has been experiencing chronic hip pain, described as relentless and similar to a toothache, affecting his walking, sitting, and sleeping. The pain has led to decreased mobility and a weight gain of approximately five pounds. He received a cortisone injection that provided relief for about a week. He plans to undergo hip replacement surgery once eligible for Medicare in November.  He has noticed a scratchy feeling in his eye for about a week, which he attributes to work-related stress. He has noticed a scratchy feeling in his eye for about a week and has observed something on the lower eyelid, for which he has been applying a hot cloth. He recalls a similar issue in the past, which he resolved by removing a hard mass himself.  He is concerned about weight gain, which he attributes to stress eating. He acknowledges increased consumption of snacks and chocolate and is open to having blood work done to check his sugar and cholesterol levels.  He is currently taking two pumps of testosterone  daily and a statin (Lipitor) at night. He also takes allopurinol , which he finds sufficient for his needs.  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  He reports having psoriasis, which is mostly controlled with only one small persistent area  remaining.   Lifestyle Diet: Trying to get more fruits and vegetables.  Exercise: Limited due to right hip pain.      12/04/2023    1:50 PM  Depression screen PHQ 2/9  Decreased Interest 0  Down, Depressed, Hopeless 0  PHQ - 2 Score 0    There are no preventive care reminders to display for this patient.   ROS: Per HPI, otherwise a complete review of systems was negative.   PMH:  The following were reviewed and entered/updated in epic: Past Medical History:  Diagnosis Date   Diverticulosis    diverticulitis in 2018, no recurrence   GERD (gastroesophageal reflux disease)    High cholesterol    Psoriasis    Psoriatic arthritis [facility])    Patient Active Problem List   Diagnosis Date Noted   Unilateral primary osteoarthritis, right hip 10/12/2023   Prediabetes 08/04/2023   Vitamin D  deficiency 01/29/2022   Carotid artery disease (HCC) 08/01/2021   Primary osteoarthritis of both hands 06/18/2020   Chronic SI joint pain 06/18/2020   Primary osteoarthritis of both hips 06/18/2020   Primary osteoarthritis of both knees 06/18/2020   Primary osteoarthritis of both feet 06/18/2020   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 Dr. Rolan Bend: Single 5 mm transverse colon adenoma, sigmoid diverticulosis.  Repeat 5 years   dental surgeries     KNEE ARTHROSCOPY Bilateral    LASIK Bilateral 2006   TONSILLECTOMY     as a child    TOOTH EXTRACTION     WRIST SURGERY Right     Family History  Problem Relation Age of Onset   Psoriasis Sister    Psoriasis Paternal Grandmother    Healthy Daughter    ADD / ADHD Son    Drug abuse Son    Colon cancer Neg Hx    Esophageal cancer Neg Hx    Rectal cancer Neg Hx     Stomach cancer Neg Hx     Medications- reviewed and updated Current Outpatient Medications  Medication Sig Dispense Refill   allopurinol  (ZYLOPRIM ) 100 MG tablet TAKE ONE TABLET BY MOUTH ONCE A DAY 90 tablet 0   ASPIRIN 81 PO Take 81 mg by mouth daily.     betamethasone  valerate (VALISONE ) 0.1 % cream Apply topically once daily as needed 45 g 0   ibuprofen (ADVIL) 200 MG tablet Take 800 mg by mouth 2 (two) times daily as needed.     ixekizumab  (TALTZ ) 80 MG/ML injection INJECT 1 SYRINGE SUBCUTANEOUSLY  EVERY 4 WEEKS 3 mL 0   Misc Natural Products (JOINT SUPPORT PO) Take 1 tablet by mouth daily.     ondansetron  (ZOFRAN -ODT) 4 MG disintegrating tablet Take 1 tablet (4 mg total) by mouth every 8 (eight) hours as needed for nausea or vomiting. 20 tablet 0   trimethoprim -polymyxin b  (POLYTRIM ) ophthalmic solution Place 2 drops into the left eye every 6 (six) hours. 10 mL 0   atorvastatin  (LIPITOR) 20 MG tablet Take 1 tablet (20 mg total) by mouth daily. 90 tablet 3   Testosterone  12.5 MG/ACT (1%) GEL APPLY FOUR PUMPS  TOPICALLY TO THE SKIN DAILY 150 g 0   No current facility-administered medications for this visit.    Allergies-reviewed and updated No Known Allergies  Social History   Socioeconomic History   Marital status: Divorced    Spouse name: Not on file   Number of children: Not on file   Years of education: Not on file   Highest education level: Not on file  Occupational History   Not on file  Tobacco Use   Smoking status: Never    Passive exposure: Never   Smokeless tobacco: Never  Vaping Use   Vaping status: Never Used  Substance and Sexual Activity   Alcohol use: Yes    Alcohol/week: 5.0 standard drinks of alcohol    Types: 1 Glasses of wine, 4 Cans of beer per week    Comment: occ   Drug use: Never   Sexual activity: Not on file  Other Topics Concern   Not on file  Social History Narrative   Not on file   Social Drivers of Health   Financial Resource Strain:  Not on file  Food Insecurity: Not on file  Transportation Needs: Not on file  Physical Activity: Not on file  Stress: Not on file  Social Connections: Not on file        Objective:  Physical Exam: BP 139/86   Pulse 80   Temp (!) 97.3 F (36.3 C) (Temporal)   Ht 5' 10.5 (1.791 m)   Wt 228 lb 3.2 oz (103.5 kg)   SpO2 96%   BMI 32.28 kg/m   Body mass index is 32.28 kg/m. Wt Readings from Last 3 Encounters:  03/08/24 228 lb 3.2 oz (103.5 kg)  02/29/24 230 lb 3.2 oz (104.4 kg)  12/04/23 218 lb 9.6 oz (99.2 kg)   Gen: NAD, resting comfortably HEENT: TMs normal bilaterally. OP clear. No thyromegaly noted.  Stye noted on left lower inner eyelid CV: RRR with no murmurs appreciated Pulm: NWOB, CTAB with no crackles, wheezes, or rhonchi GI: Normal bowel sounds present. Soft, Nontender, Nondistended. MSK: no edema, cyanosis, or clubbing noted Skin: warm, dry Neuro: CN2-12 grossly intact. Strength 5/5 in upper and lower extremities. Reflexes symmetric and intact bilaterally.  Psych: Normal affect and thought content     Alania Overholt M. Kennyth, MD 03/08/2024 10:11 AM

## 2024-03-08 NOTE — Assessment & Plan Note (Signed)
 Following with orthopedics.  Has upcoming hip replacement in a couple of months.

## 2024-03-08 NOTE — Assessment & Plan Note (Signed)
 Check A1c.

## 2024-03-08 NOTE — Assessment & Plan Note (Signed)
 Check labs

## 2024-03-08 NOTE — Assessment & Plan Note (Signed)
 Check vitamin D.

## 2024-03-08 NOTE — Assessment & Plan Note (Signed)
 Stable.  No recent flares.  Check uric acid level today.  On allopurinol  100 mg daily and tolerating well.

## 2024-03-08 NOTE — Assessment & Plan Note (Signed)
 On testosterone  2 pumps daily.  Check labs today including CBC, testosterone , and PSA.

## 2024-03-08 NOTE — Assessment & Plan Note (Signed)
 On Taltz  per rheumatology.

## 2024-03-08 NOTE — Patient Instructions (Addendum)
 It was very nice to see you today!  VISIT SUMMARY: Today, you had your annual physical exam. We discussed your chronic hip pain, recent eye discomfort, weight gain, and other health concerns. We also reviewed your current medications and planned for upcoming blood work and vaccinations.  YOUR PLAN: CHRONIC RIGHT HIP OSTEOARTHRITIS: You have chronic pain in your right hip due to osteoarthritis, which affects your mobility and daily activities. -You are scheduled for hip replacement surgery in November after you enroll in Medicare. -A previous cortisone injection provided temporary relief.  STYE ON RIGHT LOWER EYELID: You have a stye on your right lower eyelid causing a scratchy feeling and visible swelling. -Continue using warm compresses on the affected eye. -Use the prescribed eye drops. -You may use tear-free baby shampoo for gentle eyelid hygiene if desired.  HEARING LOSS: You have chronic hearing loss, particularly in certain frequency ranges, likely due to past occupational noise exposure. -Consider getting hearing aids after you enroll in Medicare.  HYPERLIPIDEMIA: Your high cholesterol is being managed with Lipitor. -Continue taking Lipitor as prescribed. -We will check your cholesterol levels with the upcoming blood work.  PREDIABETES: Your recent weight gain may be due to stress and decreased mobility, which could affect your blood sugar levels. -We will check your glucose levels with the upcoming blood work.  IDIOPATHIC GOUT: Your gout is being managed with allopurinol . -Continue taking allopurinol  as prescribed. -We will check your uric acid levels with the upcoming blood work.  ARTHROPATHIC PSORIASIS: Your psoriasis is well-controlled with minimal residual lesions. -Continue your current management plan for psoriasis.  GENERAL HEALTH MAINTENANCE: Routine wellness visit. -You are up to date on your colonoscopy and pneumonia vaccine. -We will administer the flu vaccine  today. -We will order blood work to check your glucose, cholesterol, and testosterone  levels.  Return in about 1 year (around 03/08/2025) for Annual Physical.   Take care, Dr Kennyth  PLEASE NOTE:  If you had any lab tests, please let us  know if you have not heard back within a few days. You may see your results on mychart before we have a chance to review them but we will give you a call once they are reviewed by us .   If we ordered any referrals today, please let us  know if you have not heard from their office within the next week.   If you had any urgent prescriptions sent in today, please check with the pharmacy within an hour of our visit to make sure the prescription was transmitted appropriately.   Please try these tips to maintain a healthy lifestyle:  Eat at least 3 REAL meals and 1-2 snacks per day.  Aim for no more than 5 hours between eating.  If you eat breakfast, please do so within one hour of getting up.   Each meal should contain half fruits/vegetables, one quarter protein, and one quarter carbs (no bigger than a computer mouse)  Cut down on sweet beverages. This includes juice, soda, and sweet tea.   Drink at least 1 glass of water with each meal and aim for at least 8 glasses per day  Exercise at least 150 minutes every week.    Preventive Care 12-81 Years Old, Male Preventive care refers to lifestyle choices and visits with your health care provider that can promote health and wellness. Preventive care visits are also called wellness exams. What can I expect for my preventive care visit? Counseling During your preventive care visit, your health care provider may  ask about your: Medical history, including: Past medical problems. Family medical history. Current health, including: Emotional well-being. Home life and relationship well-being. Sexual activity. Lifestyle, including: Alcohol, nicotine or tobacco, and drug use. Access to firearms. Diet, exercise,  and sleep habits. Safety issues such as seatbelt and bike helmet use. Sunscreen use. Work and work Astronomer. Physical exam Your health care provider will check your: Height and weight. These may be used to calculate your BMI (body mass index). BMI is a measurement that tells if you are at a healthy weight. Waist circumference. This measures the distance around your waistline. This measurement also tells if you are at a healthy weight and may help predict your risk of certain diseases, such as type 2 diabetes and high blood pressure. Heart rate and blood pressure. Body temperature. Skin for abnormal spots. What immunizations do I need?  Vaccines are usually given at various ages, according to a schedule. Your health care provider will recommend vaccines for you based on your age, medical history, and lifestyle or other factors, such as travel or where you work. What tests do I need? Screening Your health care provider may recommend screening tests for certain conditions. This may include: Lipid and cholesterol levels. Diabetes screening. This is done by checking your blood sugar (glucose) after you have not eaten for a while (fasting). Hepatitis B test. Hepatitis C test. HIV (human immunodeficiency virus) test. STI (sexually transmitted infection) testing, if you are at risk. Lung cancer screening. Prostate cancer screening. Colorectal cancer screening. Talk with your health care provider about your test results, treatment options, and if necessary, the need for more tests. Follow these instructions at home: Eating and drinking  Eat a diet that includes fresh fruits and vegetables, whole grains, lean protein, and low-fat dairy products. Take vitamin and mineral supplements as recommended by your health care provider. Do not drink alcohol if your health care provider tells you not to drink. If you drink alcohol: Limit how much you have to 0-2 drinks a day. Know how much alcohol is  in your drink. In the [place] one drink equals one 12 oz bottle of beer (355 mL), one 5 oz glass of wine (148 mL), or one 1 oz glass of hard liquor (44 mL). Lifestyle Brush your teeth every morning and night with fluoride toothpaste. Floss one time each day. Exercise for at least 30 minutes 5 or more days each week. Do not use any products that contain nicotine or tobacco. These products include cigarettes, chewing tobacco, and vaping devices, such as e-cigarettes. If you need help quitting, ask your health care provider. Do not use drugs. If you are sexually active, practice safe sex. Use a condom or other form of protection to prevent STIs. Take aspirin only as told by your health care provider. Make sure that you understand how much to take and what form to take. Work with your health care provider to find out whether it is safe and beneficial for you to take aspirin daily. Find healthy ways to manage stress, such as: Meditation, yoga, or listening to music. Journaling. Talking to a trusted person. Spending time with friends and family. Minimize exposure to UV radiation to reduce your risk of skin cancer. Safety Always wear your seat belt while driving or riding in a vehicle. Do not drive: If you have been drinking alcohol. Do not ride with someone who has been drinking. When you are tired or distracted. While texting. If you have been using any mind-altering substances  or drugs. Wear a helmet and other protective equipment during sports activities. If you have firearms in your house, make sure you follow all gun safety procedures. What's next? Go to your health care provider once a year for an annual wellness visit. Ask your health care provider how often you should have your eyes and teeth checked. Stay up to date on all vaccines. This information is not intended to replace advice given to you by your health care provider. Make sure you discuss any questions you have with your health  care provider. Document Revised: 12/19/2020 Document Reviewed: 12/19/2020 Elsevier Patient Education  2024 Elsevier Inc. [place] should

## 2024-03-10 ENCOUNTER — Ambulatory Visit: Payer: Self-pay | Admitting: Family Medicine

## 2024-03-10 DIAGNOSIS — R7989 Other specified abnormal findings of blood chemistry: Secondary | ICD-10-CM

## 2024-03-10 NOTE — Progress Notes (Signed)
 A1c is elevated into the diabetic range.  Recommend he come back here to discuss treatment options for this.  His LDL is also higher than goal.  Has he been taking his Lipitor 20  mg daily?  If so we should increase to 40 mg daily and recheck again in 3 to 6 months.  His testosterone  is still low.  Can we verify that he has been taking his testosterone  replacement as prescribed?  If so we will need to refer him to urology for further management.  All of his other labs are at goal and we can recheck in a year.

## 2024-03-11 ENCOUNTER — Encounter: Payer: Self-pay | Admitting: Orthopaedic Surgery

## 2024-03-16 ENCOUNTER — Telehealth: Payer: Self-pay | Admitting: *Deleted

## 2024-03-16 NOTE — Telephone Encounter (Signed)
 Copied from CRM [id number]. Topic: Clinical - Lab/Test Results >> Mar 16, 2024 12:07 PM Mesmerise C wrote: Reason for CRM: Patient returning call read lab results verbatim patient understood had no questions    See results note Jeffrey Klein,RMA

## 2024-03-18 NOTE — Telephone Encounter (Signed)
 SABRA

## 2024-03-21 ENCOUNTER — Telehealth: Payer: Self-pay | Admitting: Orthopaedic Surgery

## 2024-03-21 NOTE — Telephone Encounter (Signed)
 Patient called and wants to go ahead and schedule for surgery. CB#[phone]

## 2024-03-21 NOTE — Telephone Encounter (Signed)
I called patient and scheduled surgery. 

## 2024-03-25 ENCOUNTER — Telehealth: Payer: Self-pay | Admitting: *Deleted

## 2024-03-25 NOTE — Telephone Encounter (Signed)
 To Whom It May Concern:  [name] is cleared for surgery from a rheumatology standpoint. [name] is on Taltz . Based on ACR guidelines he will need to hold Taltz  for four weeks before surgery. He may resume Taltz  2 weeks after surgery as long as he is healing well and has no infection.    If you have any questions or concerns, please do not hesitate to call.

## 2024-03-25 NOTE — Telephone Encounter (Signed)
-----   Message from Reena Stark sent at 03/25/2024 10:27 AM EDT ----- Regarding: FW: Taltz   ----- Message ----- From: Elaine Julianna POUR Sent: 03/25/2024   9:51 AM EDT To: Reena VEAR Stark, RT Subject: Taltz                                           Planning a THA on 05-10-24.  When should patient stop Taltz  before surgery?  Thanks!

## 2024-03-25 NOTE — Telephone Encounter (Signed)
 I called patient and left voice mail.  Also, sent MyChart message with the information.

## 2024-04-11 ENCOUNTER — Ambulatory Visit: Admitting: Orthopaedic Surgery

## 2024-04-11 DIAGNOSIS — M1611 Unilateral primary osteoarthritis, right hip: Secondary | ICD-10-CM | POA: Diagnosis not present

## 2024-04-11 DIAGNOSIS — M25551 Pain in right hip: Secondary | ICD-10-CM | POA: Diagnosis not present

## 2024-04-11 NOTE — Progress Notes (Signed)
 The patient comes in today to go over hip replacement surgery for his upcoming right hip surgery that is scheduled for May 10, 2024 to treat significant well-documented osteoarthritis of his right hip.  He is also a patient with rheumatologic disease and he is stopped his injection for that issue in order to have a stronger immune system for surgery.  He is not diabetic.  He is not overweight either.  At this point his right hip pain has become daily and it is detrimentally affecting his mobility, his quality of life and his actives of daily living.  On exam today his left hip moves smoothly and fluidly but his right hip has significant pain to internal and external rotation with stiffness as well.  He has a hard time putting shoes and socks on that side and clipping his toenails.  Again previous x-rays of his pelvis and right hip show significant arthritis with joint space narrowing and flattening of the femoral head and an irregular articular surface on the right hip.  We did go over hip replacement in detail showing the hip replacement model and actual components as well.  We discussed the risks and benefits of the surgery expect from an intraoperative and postoperative standpoint.  All questions and concerns were answered and addressed.  We will see him on the day of surgery for his right hip replacement.

## 2024-04-12 ENCOUNTER — Other Ambulatory Visit: Payer: Self-pay | Admitting: Family Medicine

## 2024-04-20 ENCOUNTER — Telehealth: Payer: Self-pay | Admitting: Pharmacist

## 2024-04-20 DIAGNOSIS — L409 Psoriasis, unspecified: Secondary | ICD-10-CM

## 2024-04-20 DIAGNOSIS — L4050 Arthropathic psoriasis, unspecified: Secondary | ICD-10-CM

## 2024-04-20 NOTE — Telephone Encounter (Signed)
 Patient reached out stating he has enrolled into Medicare A/B and prescription drug coverage that will become active on 05/07/2024. He has been advised to return the [facility] patient assistance application to office so we may prepare Dr. Jammie form and also get prior authorization sorted.  He is aware that we will not be able to submit application until after 05/07/2024  Seyon Strader, PharmD, MPH, BCPS, CPP Clinical Pharmacist [facility])

## 2024-04-21 NOTE — Telephone Encounter (Signed)
 Patient dropped off forms for Taltz . He dropped off printout of [employer] card and A/B card in media tab.  New [employer] insurance active on 05/07/2024: ID: [id number] PCN: [id number] Group: [id number] PCN: [id number]  Prescriber form placed in Waddell Craze, PA-C's folder for signature  Sherry Pennant, PharmD, MPH, BCPS, CPP Clinical Pharmacist [facility])

## 2024-04-27 ENCOUNTER — Other Ambulatory Visit: Payer: Self-pay | Admitting: Physician Assistant

## 2024-04-27 DIAGNOSIS — Z01818 Encounter for other preprocedural examination: Secondary | ICD-10-CM

## 2024-05-03 NOTE — Pre-Procedure Instructions (Signed)
 Surgical Instructions   Your procedure is scheduled on Tuesday, November 4th.  Report to [facility] Main Entrance A at 0945 A.M., then check in with the Admitting office. Any questions or running late day of surgery: call [phone]  Questions prior to your surgery date: call [phone], Monday-Friday, 8am-4pm. If you experience any cold or flu symptoms such as cough, fever, chills, shortness of breath, etc. between now and your scheduled surgery, please notify us  at the above number.     Remember:  Do not eat after midnight the night before your surgery  You may drink clear liquids until 0845 the morning of your surgery.   Clear liquids allowed are: Water, Non-Citrus Juices (without pulp), Carbonated Beverages, Clear Tea (no milk, honey, etc.), Black Coffee Only (NO MILK, CREAM OR POWDERED CREAMER of any kind), and Gatorade.  Patient Instructions  The night before surgery:  No food after midnight. ONLY clear liquids after midnight  The day of surgery (if you do NOT have diabetes):  Drink ONE (1) Pre-Surgery Clear Ensure by 0845 the morning of surgery. Drink in one sitting. Do not sip.  This drink was given to you during your hospital  pre-op appointment visit.  Nothing else to drink after completing the  Pre-Surgery Clear Ensure.          If you have questions, please contact your surgeon's office.     Take these medicines the morning of surgery with A SIP OF WATER: allopurinol  (ZYLOPRIM )  atorvastatin  (LIPITOR)  trimethoprim -polymyxin b  (POLYTRIM ) eye drops  May take these medicines IF NEEDED:       ondansetron  (ZOFRAN -ODT)   One week prior to surgery, STOP taking any Aspirin (unless otherwise instructed by your surgeon) Aleve, Naproxen, Ibuprofen, Motrin, Advil, Goody's, BC's, all herbal medications, fish oil, and non-prescription vitamins.                     Do NOT Smoke (Tobacco/Vaping) for 24 hours prior to your procedure.  If you use a CPAP at night, you  may bring your mask/headgear for your overnight stay.   You will be asked to remove any contacts, glasses, piercing's, hearing aid's, dentures/partials prior to surgery. Please bring cases for these items if needed.    Patients discharged the day of surgery will not be allowed to drive home, and someone needs to stay with them for 24 hours.  SURGICAL WAITING ROOM VISITATION Patients may have no more than 2 support people in the waiting area - these visitors may rotate.   Pre-op nurse will coordinate an appropriate time for 1 ADULT support person, who may not rotate, to accompany patient in pre-op.  Children under the age of 60 must have an adult with them who is not the patient and must remain in the main waiting area with an adult.  If the patient needs to stay at the hospital during part of their recovery, the visitor guidelines for inpatient rooms apply.  Please refer to the [facility] website for the visitor guidelines for any additional information.   If you received a COVID test during your pre-op visit  it is requested that you wear a mask when out in public, stay away from anyone that may not be feeling well and notify your surgeon if you develop symptoms. If you have been in contact with anyone that has tested positive in the last 10 days please notify you surgeon.      Pre-operative 4 CHG Bathing Instructions  You can play a key role in reducing the risk of infection after surgery. Your skin needs to be as free of germs as possible. You can reduce the number of germs on your skin by washing with CHG (chlorhexidine gluconate) soap before surgery. CHG is an antiseptic soap that kills germs and continues to kill germs even after washing.   DO NOT use if you have an allergy to chlorhexidine/CHG or antibacterial soaps. If your skin becomes reddened or irritated, stop using the CHG and notify one of our RNs at [phone].   Please shower with the CHG soap starting 4 days before  surgery using the following schedule:     Please keep in mind the following:  DO NOT shave, including legs and underarms, starting the day of your first shower.   You may shave your face at any point before/day of surgery.  Place clean sheets on your bed the day you start using CHG soap. Use a clean washcloth (not used since being washed) for each shower. DO NOT sleep with pets once you start using the CHG.   CHG Shower Instructions:  Wash your face and private area with normal soap. If you choose to wash your hair, wash first with your normal shampoo.  After you use shampoo/soap, rinse your hair and body thoroughly to remove shampoo/soap residue.  Turn the water OFF and apply  bottle of CHG soap to a CLEAN washcloth.  Apply CHG soap ONLY FROM YOUR NECK DOWN TO YOUR TOES (washing for 3-5 minutes)  DO NOT use CHG soap on face, private areas, open wounds, or sores.  Pay special attention to the area where your surgery is being performed.  If you are having back surgery, having someone wash your back for you may be helpful. Wait 2 minutes after CHG soap is applied, then you may rinse off the CHG soap.  Pat dry with a clean towel  Put on clean clothes/pajamas   If you choose to wear lotion, please use ONLY the CHG-compatible lotions that are listed below.  Additional instructions for the day of surgery:  If you choose, you may shower the morning of surgery with an antibacterial soap.  DO NOT APPLY any lotions, deodorants, cologne, or perfumes.   Do not bring valuables to the hospital. [facility] is not responsible for any belongings/valuables. Do not wear nail polish, gel polish, artificial nails, or any other type of covering on natural nails (fingers and toes) Do not wear jewelry or makeup Put on clean/comfortable clothes.  Please brush your teeth.  Ask your nurse before applying any prescription medications to the skin.     CHG Compatible Lotions   Aveeno Moisturizing  lotion  Cetaphil Moisturizing Cream  Cetaphil Moisturizing Lotion  Clairol Herbal Essence Moisturizing Lotion, Dry Skin  Clairol Herbal Essence Moisturizing Lotion, Extra Dry Skin  Clairol Herbal Essence Moisturizing Lotion, Normal Skin  Curel Age Defying Therapeutic Moisturizing Lotion with Alpha Hydroxy  Curel Extreme Care Body Lotion  Curel Soothing Hands Moisturizing Hand Lotion  Curel Therapeutic Moisturizing Cream, Fragrance-Free  Curel Therapeutic Moisturizing Lotion, Fragrance-Free  Curel Therapeutic Moisturizing Lotion, Original Formula  Eucerin Daily Replenishing Lotion  Eucerin Dry Skin Therapy Plus Alpha Hydroxy Crme  Eucerin Dry Skin Therapy Plus Alpha Hydroxy Lotion  Eucerin Original Crme  Eucerin Original Lotion  Eucerin Plus Crme Eucerin Plus Lotion  Eucerin TriLipid Replenishing Lotion  Keri Anti-Bacterial Hand Lotion  Keri Deep Conditioning Original Lotion Dry Skin Formula Softly Scented  Keri Deep  Conditioning Original Lotion, Fragrance Free Sensitive Skin Formula  Keri Lotion Fast Absorbing Fragrance Free Sensitive Skin Formula  Keri Lotion Fast Absorbing Softly Scented Dry Skin Formula  Keri Original Lotion  Keri Skin Renewal Lotion Keri Silky Smooth Lotion  Keri Silky Smooth Sensitive Skin Lotion  Nivea Body Creamy Conditioning Oil  Nivea Body Extra Enriched Lotion  Nivea Body Original Lotion  Nivea Body Sheer Moisturizing Lotion Nivea Crme  Nivea Skin Firming Lotion  NutraDerm 30 Skin Lotion  NutraDerm Skin Lotion  NutraDerm Therapeutic Skin Cream  NutraDerm Therapeutic Skin Lotion  ProShield Protective Hand Cream  Provon moisturizing lotion  Please read over the following fact sheets that you were given.

## 2024-05-04 ENCOUNTER — Other Ambulatory Visit: Payer: Self-pay

## 2024-05-04 ENCOUNTER — Encounter (HOSPITAL_COMMUNITY)
Admission: RE | Admit: 2024-05-04 | Discharge: 2024-05-04 | Disposition: A | Discharge status: Home / Self Care | Source: Ambulatory Visit | Attending: Orthopaedic Surgery | Admitting: Orthopaedic Surgery

## 2024-05-04 ENCOUNTER — Encounter (HOSPITAL_COMMUNITY): Payer: Self-pay

## 2024-05-04 DIAGNOSIS — Z01818 Encounter for other preprocedural examination: Secondary | ICD-10-CM | POA: Diagnosis present

## 2024-05-04 LAB — CBC
HCT: 46.1 % (ref 39.0–52.0)
Hemoglobin: 15.3 g/dL (ref 13.0–17.0)
MCH: 29.7 pg (ref 26.0–34.0)
MCHC: 33.2 g/dL (ref 30.0–36.0)
MCV: 89.5 fL (ref 80.0–100.0)
Platelets: 323 K/uL (ref 150–400)
RBC: 5.15 MIL/uL (ref 4.22–5.81)
RDW: 12.7 % (ref 11.5–15.5)
WBC: 5.8 K/uL (ref 4.0–10.5)
nRBC: 0 % (ref 0.0–0.2)

## 2024-05-04 LAB — TYPE AND SCREEN
ABO/RH(D): A POS
Antibody Screen: NEGATIVE

## 2024-05-04 LAB — BASIC METABOLIC PANEL WITH GFR
Anion gap: 15 (ref 5–15)
BUN: 13 mg/dL (ref 8–23)
CO2: 26 mmol/L (ref 22–32)
Calcium: 9.4 mg/dL (ref 8.9–10.3)
Chloride: 97 mmol/L — ABNORMAL LOW (ref 98–111)
Creatinine, Ser: 1.05 mg/dL (ref 0.61–1.24)
GFR, Estimated: >60 mL/min (ref >60)
Glucose, Bld: 91 mg/dL (ref 70–99)
Potassium: 4.1 mmol/L (ref 3.5–5.1)
Sodium: 138 mmol/L (ref 135–145)

## 2024-05-04 LAB — SURGICAL PCR SCREEN
MRSA, PCR: NEGATIVE
Staphylococcus aureus: NEGATIVE

## 2024-05-04 NOTE — Progress Notes (Signed)
 PCP - Worth Kitty MD Cardiologist - Denies  PPM/ICD - Denies Device Orders - n/a Rep Notified - n/a  Chest x-ray - n/a EKG - 05-04-24 Stress Test - denies ECHO - denies Cardiac Cath - denies  Sleep Study - denies CPAP - n/a  NON-diabetic w/no GLP 1 usage  Blood Thinner Instructions: Denies Aspirin Instructions: Denies  ERAS Protcol - Clears until 0845 PRE-SURGERY Ensure or G2- ensure  COVID TEST- n/a   Anesthesia review: no  Patient denies shortness of breath, fever, cough and chest pain at PAT appointment   All instructions explained to the patient, with a verbal understanding of the material. Patient agrees to go over the instructions while at home for a better understanding. Patient also instructed to self quarantine after being tested for COVID-19. The opportunity to ask questions was provided.

## 2024-05-04 NOTE — Progress Notes (Signed)
 Surgical Instructions     Your procedure is scheduled on Tuesday, November 4th.   Report to [facility] Main Entrance A at 0945 A.M., then check in with the Admitting office. Any questions or running late day of surgery: call [phone]   Questions prior to your surgery date: call [phone], Monday-Friday, 8am-4pm. If you experience any cold or flu symptoms such as cough, fever, chills, shortness of breath, etc. between now and your scheduled surgery, please notify us  at the above number.            Remember:       Do not eat after midnight the night before your surgery   You may drink clear liquids until 0845 the morning of your surgery.   Clear liquids allowed are: Water, Non-Citrus Juices (without pulp), Carbonated Beverages, Clear Tea (no milk, honey, etc.), Black Coffee Only (NO MILK, CREAM OR POWDERED CREAMER of any kind), and Gatorade.   Patient Instructions   The night before surgery:  No food after midnight. ONLY clear liquids after midnight   The day of surgery (if you do NOT have diabetes):  Drink ONE (1) Pre-Surgery Clear Ensure by 0845 the morning of surgery. Drink in one sitting. Do not sip.  This drink was given to you during your hospital pre-op appointment visit.   Nothing else to drink after completing the Pre-Surgery Clear Ensure.            If you have questions, please contact your surgeon's office.           Take these medicines the morning of surgery with A SIP OF WATER: allopurinol  (ZYLOPRIM )  atorvastatin  (LIPITOR)     May take these medicines IF NEEDED:       ondansetron  (ZOFRAN -ODT)    One week prior to surgery, STOP taking any Aspirin (unless otherwise instructed by your surgeon) Aleve, Naproxen, Motrin, Goody's, BC's, all herbal medications, fish oil, and non-prescription vitamins. This includes your ibuprofen (ADVIL).                     Do NOT Smoke (Tobacco/Vaping) for 24 hours prior to your procedure.   If you use a CPAP at night,  you may bring your mask/headgear for your overnight stay.   You will be asked to remove any contacts, glasses, piercing's, hearing aid's, dentures/partials prior to surgery. Please bring cases for these items if needed.    Patients discharged the day of surgery will not be allowed to drive home, and someone needs to stay with them for 24 hours.   SURGICAL WAITING ROOM VISITATION Patients may have no more than 2 support people in the waiting area - these visitors may rotate.   Pre-op nurse will coordinate an appropriate time for 1 ADULT support person, who may not rotate, to accompany patient in pre-op.  Children under the age of 59 must have an adult with them who is not the patient and must remain in the main waiting area with an adult.   If the patient needs to stay at the hospital during part of their recovery, the visitor guidelines for inpatient rooms apply.   Please refer to the [facility] website for the visitor guidelines for any additional information.     If you received a COVID test during your pre-op visit  it is requested that you wear a mask when out in public, stay away from anyone that may not be feeling well and notify your surgeon if you develop  symptoms. If you have been in contact with anyone that has tested positive in the last 10 days please notify you surgeon.         Pre-operative 4 CHG Bathing Instructions    You can play a key role in reducing the risk of infection after surgery. Your skin needs to be as free of germs as possible. You can reduce the number of germs on your skin by washing with CHG (chlorhexidine gluconate) soap before surgery. CHG is an antiseptic soap that kills germs and continues to kill germs even after washing.    DO NOT use if you have an allergy to chlorhexidine/CHG or antibacterial soaps. If your skin becomes reddened or irritated, stop using the CHG and notify one of our RNs at [phone].    Please shower with the CHG soap starting 4  days before surgery using the following schedule:       Please keep in mind the following:  You may shave your face at any point before/day of surgery.  Place clean sheets on your bed the day you start using CHG soap. Use a clean washcloth (not used since being washed) for each shower. DO NOT sleep with pets once you start using the CHG.    CHG Shower Instructions:  Wash your face and private area with normal soap. If you choose to wash your hair, wash first with your normal shampoo.  After you use shampoo/soap, rinse your hair and body thoroughly to remove shampoo/soap residue.  Turn the water OFF and apply  bottle of CHG soap to a CLEAN washcloth.  Apply CHG soap ONLY FROM YOUR NECK DOWN TO YOUR TOES (washing for 3-5 minutes)  DO NOT use CHG soap on face, private areas, open wounds, or sores.  Pay special attention to the area where your surgery is being performed.  If you are having back surgery, having someone wash your back for you may be helpful. Wait 2 minutes after CHG soap is applied, then you may rinse off the CHG soap.  Pat dry with a clean towel  Put on clean clothes/pajamas   If you choose to wear lotion, please use ONLY the CHG-compatible lotions that are listed below.   Additional instructions for the day of surgery:   If you choose, you may shower the morning of surgery with an antibacterial soap.  DO NOT APPLY any lotions, deodorants, cologne, or perfumes.   Do not bring valuables to the hospital. [facility] is not responsible for any belongings/valuables. Do not wear jewelry Put on clean/comfortable clothes.  Please brush your teeth.  Ask your nurse before applying any prescription medications to the skin.        CHG Compatible Lotions    Aveeno Moisturizing lotion  Cetaphil Moisturizing Cream  Cetaphil Moisturizing Lotion  Clairol Herbal Essence Moisturizing Lotion, Dry Skin  Clairol Herbal Essence Moisturizing Lotion, Extra Dry Skin  Clairol Herbal  Essence Moisturizing Lotion, Normal Skin  Curel Age Defying Therapeutic Moisturizing Lotion with Alpha Hydroxy  Curel Extreme Care Body Lotion  Curel Soothing Hands Moisturizing Hand Lotion  Curel Therapeutic Moisturizing Cream, Fragrance-Free  Curel Therapeutic Moisturizing Lotion, Fragrance-Free  Curel Therapeutic Moisturizing Lotion, Original Formula  Eucerin Daily Replenishing Lotion  Eucerin Dry Skin Therapy Plus Alpha Hydroxy Crme  Eucerin Dry Skin Therapy Plus Alpha Hydroxy Lotion  Eucerin Original Crme  Eucerin Original Lotion  Eucerin Plus Crme Eucerin Plus Lotion  Eucerin TriLipid Replenishing Lotion  Keri Anti-Bacterial Hand Lotion  Keri Deep Conditioning  Original Lotion Dry Skin Formula Softly Scented  Keri Deep Conditioning Original Lotion, Fragrance Free Sensitive Skin Formula  Keri Lotion Fast Absorbing Fragrance Free Sensitive Skin Formula  Keri Lotion Fast Absorbing Softly Scented Dry Skin Formula  Keri Original Lotion  Keri Skin Renewal Lotion Keri Silky Smooth Lotion  Keri Silky Smooth Sensitive Skin Lotion  Nivea Body Creamy Conditioning Oil  Nivea Body Extra Enriched Lotion  Nivea Body Original Lotion  Nivea Body Sheer Moisturizing Lotion Nivea Crme  Nivea Skin Firming Lotion  NutraDerm 30 Skin Lotion  NutraDerm Skin Lotion  NutraDerm Therapeutic Skin Cream  NutraDerm Therapeutic Skin Lotion  ProShield Protective Hand Cream  Provon moisturizing lotion   Please read over the following fact sheets that you were given

## 2024-05-09 ENCOUNTER — Encounter: Payer: Self-pay | Admitting: Orthopaedic Surgery

## 2024-05-09 ENCOUNTER — Other Ambulatory Visit (HOSPITAL_COMMUNITY): Payer: Self-pay

## 2024-05-09 ENCOUNTER — Encounter: Payer: Self-pay | Admitting: Radiology

## 2024-05-09 ENCOUNTER — Ambulatory Visit: Admitting: Orthopaedic Surgery

## 2024-05-09 DIAGNOSIS — M1611 Unilateral primary osteoarthritis, right hip: Secondary | ICD-10-CM

## 2024-05-09 DIAGNOSIS — M25551 Pain in right hip: Secondary | ICD-10-CM

## 2024-05-09 NOTE — Telephone Encounter (Signed)
 Received notification from [employer] regarding a prior authorization for TALTZ . Authorization has been APPROVED from 05/09/2024 to 07/06/2025. Approval letter sent to scan center.  Per test claim, copay for 28 days supply is $2000  Submitted Patient Assistance Application to [facility] for TALTZ  with patient portion, provider portion, insurance card copy, prior authorization approval, and current medication list. Will update patient when we receive a response.  Phone: [phone] Fax: [phone]  Sherry Pennant, PharmD, MPH, BCPS, CPP Clinical Pharmacist [facility])

## 2024-05-09 NOTE — H&P (Signed)
 TOTAL HIP ADMISSION H&P  Patient is admitted for right total hip arthroplasty.  Subjective:  Chief Complaint: right hip pain  HPI: Jeffrey Klein, 63 y.o. male, has a history of pain and functional disability in the right hip(s) due to arthritis and patient has failed non-surgical conservative treatments for greater than 12 weeks to include NSAID's and/or analgesics and activity modification.  Onset of symptoms was gradual starting over the last year  with gradually worsening course since that time.The patient noted no past surgery on the right hip(s).  Patient currently rates pain in the right hip at 10 out of 10 with activity. Patient has night pain, worsening of pain with activity and weight bearing, trendelenberg gait, pain that interfers with activities of daily living, and pain with passive range of motion. Patient has evidence of subchondral sclerosis, periarticular osteophytes, and joint space narrowing by imaging studies. This condition presents safety issues increasing the risk of falls.  There is no current active infection.  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 Dr. Rolan Klein: Single 5 mm transverse colon adenoma, sigmoid diverticulosis.  Repeat 5 years   dental surgeries     KNEE ARTHROSCOPY Bilateral    LASIK Bilateral 2006   TONSILLECTOMY     as a child    TOOTH EXTRACTION     WRIST SURGERY Right     No current facility-administered medications for this encounter.   Current Outpatient Medications  Medication Sig Dispense Refill Last Dose/Taking   allopurinol  (ZYLOPRIM ) 100 MG tablet TAKE ONE TABLET BY MOUTH ONCE A DAY 90 tablet 0 Taking   aspirin EC 81 MG tablet Take 81 mg by mouth daily.   Taking   atorvastatin  (LIPITOR) 20 MG tablet Take 1 tablet (20 mg total) by mouth daily. (Patient taking differently: Take 20 mg by mouth at bedtime.) 90 tablet 3 Taking Differently   betamethasone  valerate (VALISONE ) 0.1 % cream Apply topically once daily as needed 45 g 0 Taking   Cholecalciferol (VITAMIN D -3 PO) Take 1 capsule by mouth daily.   Taking   ixekizumab  (TALTZ ) 80 MG/ML injection INJECT 1 SYRINGE SUBCUTANEOUSLY  EVERY 4 WEEKS 3 mL 0 Taking   Misc Natural Products (JOINT SUPPORT PO) Take 1 tablet by mouth daily.   Taking   Testosterone  12.5 MG/ACT (1%) GEL APPLY FOUR PUMPS TOPICALLY TO THE SKIN DAILY (Patient taking differently: Apply 2 Pump topically daily.) 150 g 0 Taking Differently   No Known Allergies  Social History   Tobacco Use   Smoking status: Never    Passive exposure: Never   Smokeless tobacco:  Never  Substance Use Topics   Alcohol use: Yes    Alcohol/week: 5.0 standard drinks of alcohol    Types: 1 Glasses of wine, 4 Cans of beer per week    Comment: occ    Family History  Problem Relation Age of Onset   Psoriasis Sister    Psoriasis Paternal Grandmother    Healthy Daughter    ADD / ADHD Son    Drug abuse Son    Colon cancer Neg Hx    Esophageal cancer Neg Hx    Rectal cancer Neg Hx    Stomach cancer Neg Hx       Review of Systems  Objective:  Physical Exam Vitals reviewed.  Constitutional:      Appearance: Normal appearance. He is normal weight.  HENT:     Head: Normocephalic and atraumatic.  Eyes:     Extraocular Movements: Extraocular movements intact.     Pupils: Pupils are equal, round, and reactive to light.  Cardiovascular:     Rate and Rhythm: Normal rate and regular rhythm.  Pulmonary:     Effort: Pulmonary effort is normal.     Breath sounds: Normal breath sounds.  Abdominal:     Palpations: Abdomen is soft.  Musculoskeletal:     Cervical back: Normal range of motion and neck supple.     Right hip: Tenderness and bony tenderness present. Decreased range of motion. Decreased strength.  Neurological:     Mental Status: He is alert and oriented to person, place, and time.  Psychiatric:        Behavior: Behavior normal.     Vital signs in last 24 hours:    Labs:   Estimated body mass index is 29.99 kg/m as calculated from the following:   Height as of 05/04/24: 5' 10 (1.778 m).   Weight as of 05/04/24: 94.8 kg.   Imaging Review Plain radiographs demonstrate severe degenerative joint disease of the right hip(s). The bone quality appears to be excellent for age and reported activity level.      Assessment/Plan:  End stage arthritis, right hip(s)  The patient history, physical examination, clinical judgement of the provider and imaging studies are consistent with end stage degenerative joint disease of the right hip(s) and total hip arthroplasty is deemed medically necessary. The treatment options including medical management, injection therapy, arthroscopy and arthroplasty were discussed at length. The risks and benefits of total hip arthroplasty were presented and reviewed. The risks due to aseptic loosening, infection, stiffness, dislocation/subluxation,  thromboembolic complications and other imponderables were discussed.  The patient acknowledged the  explanation, agreed to proceed with the plan and consent was signed. Patient is being admitted for inpatient treatment for surgery, pain control, PT, OT, prophylactic antibiotics, VTE prophylaxis, progressive ambulation and ADL's and discharge planning.The patient is planning to be discharged home with home health services

## 2024-05-09 NOTE — Anesthesia Preprocedure Evaluation (Signed)
 Anesthesia Evaluation  Patient identified by MRN, date of birth, ID band Patient awake    Reviewed: Allergy & Precautions, NPO status , Patient's Chart, lab work & pertinent test results  History of Anesthesia Complications Negative for: history of anesthetic complications  Airway Mallampati: III  TM Distance: >3 FB Neck ROM: Full    Dental  (+) Dental Advisory Given   Pulmonary neg pulmonary ROS   Pulmonary exam normal breath sounds clear to auscultation       Cardiovascular (-) hypertension(-) angina (-) Past MI, (-) Cardiac Stents and (-) CABG (-) dysrhythmias  Rhythm:Regular Rate:Normal  HLD, carotid artery disease  He was supposed to have an echo earlier this year for dizziness but it was not approved by insurance so was not done. He reports he has had no more episodes of dizziness and believe the prior episodes were due to the COVID vaccine. He never had syncope with the dizziness.   Neuro/Psych negative neurological ROS     GI/Hepatic Neg liver ROS,GERD  ,,Diverticulosis    Endo/Other  Pre-diabetes  Renal/GU negative Renal ROS     Musculoskeletal  (+) Arthritis  (psoriatic), Osteoarthritis,    Abdominal   Peds  Hematology negative hematology ROS (+) Lab Results      Component                Value               Date                      WBC                      5.8                 05/04/2024                HGB                      15.3                05/04/2024                HCT                      46.1                05/04/2024                MCV                      89.5                05/04/2024                PLT                      323                 05/04/2024              Anesthesia Other Findings   Reproductive/Obstetrics                              Anesthesia Physical Anesthesia Plan  ASA: 2  Anesthesia Plan: MAC and Spinal   Post-op Pain Management: Tylenol  PO (pre-op)*  Induction: Intravenous  PONV Risk Score and Plan: 1 and Ondansetron , Dexamethasone, Midazolam and Treatment may vary due to age or medical condition  Airway Management Planned: Natural Airway and Simple Face Mask  Additional Equipment:   Intra-op Plan:   Post-operative Plan:   Informed Consent: I have reviewed the patients History and Physical, chart, labs and discussed the procedure including the risks, benefits and alternatives for the proposed anesthesia with the patient or authorized representative who has indicated his/her understanding and acceptance.     Dental advisory given  Plan Discussed with: CRNA and Anesthesiologist  Anesthesia Plan Comments: (I have discussed risks of neuraxial anesthesia including but not limited to infection, bleeding, nerve injury, back pain, headache, seizures, and failure of block. Patient denies bleeding disorders and is not currently anticoagulated. Labs have been reviewed. Risks and benefits discussed. All patient's questions answered.   Discussed with patient risks of MAC including, but not limited to, minor pain or discomfort, hearing people in the room, and possible need for backup general anesthesia. Risks for general anesthesia also discussed including, but not limited to, sore throat, hoarse voice, chipped/damaged teeth, injury to vocal cords, nausea and vomiting, allergic reactions, lung infection, heart attack, stroke, and death. All questions answered. )         Anesthesia Quick Evaluation

## 2024-05-09 NOTE — Telephone Encounter (Signed)
 Submitted a Prior Authorization request to [employer] for TALTZ  via CoverMyMeds. Will update once we receive a response.  Key: [id number]

## 2024-05-09 NOTE — Telephone Encounter (Signed)
 Received fax from [facility] with clinical questions. Completed and faxed back  Fax: [phone] Phone: [phone] EOC # [id number]  Sherry Pennant, PharmD, MPH, BCPS, CPP Clinical Pharmacist [facility])

## 2024-05-09 NOTE — Progress Notes (Signed)
 The patient is actually scheduled tomorrow for a right total hip replacement due to significant right hip pain and arthritis.  He had his preoperative weight last week.  He did have a few questions today but thought he was post to come back and see us  today when actually he does not need this appointment today.  We did discuss again what the surgery involves and I spoke with him what to expect from an intraoperative and postoperative standpoint.  All questions and concerns were answered and addressed.  Will see him tomorrow morning for his right total hip replacement.  He does report the pain has been slowly getting worse but he is also continue to lose weight since we saw him last.

## 2024-05-10 ENCOUNTER — Observation Stay (HOSPITAL_COMMUNITY)

## 2024-05-10 ENCOUNTER — Ambulatory Visit (HOSPITAL_BASED_OUTPATIENT_CLINIC_OR_DEPARTMENT_OTHER): Payer: Self-pay | Admitting: Anesthesiology

## 2024-05-10 ENCOUNTER — Encounter (HOSPITAL_COMMUNITY): Admission: RE | Disposition: A | Payer: Self-pay | Source: Home / Self Care | Attending: Orthopaedic Surgery

## 2024-05-10 ENCOUNTER — Encounter (HOSPITAL_COMMUNITY): Payer: Self-pay | Admitting: Anesthesiology

## 2024-05-10 ENCOUNTER — Ambulatory Visit (HOSPITAL_COMMUNITY)

## 2024-05-10 ENCOUNTER — Observation Stay (HOSPITAL_COMMUNITY)
Admission: RE | Admit: 2024-05-10 | Discharge: 2024-05-11 | Disposition: A | Discharge status: Home / Self Care | Attending: Orthopaedic Surgery | Admitting: Orthopaedic Surgery

## 2024-05-10 DIAGNOSIS — I2510 Atherosclerotic heart disease of native coronary artery without angina pectoris: Secondary | ICD-10-CM | POA: Insufficient documentation

## 2024-05-10 DIAGNOSIS — Z7982 Long term (current) use of aspirin: Secondary | ICD-10-CM | POA: Insufficient documentation

## 2024-05-10 DIAGNOSIS — M25551 Pain in right hip: Secondary | ICD-10-CM | POA: Diagnosis present

## 2024-05-10 DIAGNOSIS — M1612 Unilateral primary osteoarthritis, left hip: Secondary | ICD-10-CM | POA: Diagnosis not present

## 2024-05-10 DIAGNOSIS — M1611 Unilateral primary osteoarthritis, right hip: Principal | ICD-10-CM | POA: Diagnosis present

## 2024-05-10 DIAGNOSIS — Z96641 Presence of right artificial hip joint: Secondary | ICD-10-CM

## 2024-05-10 LAB — ABO/RH: ABO/RH(D): A POS

## 2024-05-10 SURGERY — ARTHROPLASTY, HIP, TOTAL, ANTERIOR APPROACH
Anesthesia: Monitor Anesthesia Care | Site: Hip | Laterality: Right

## 2024-05-10 MED ORDER — BUPIVACAINE IN DEXTROSE 0.75-8.25 % IT SOLN
INTRATHECAL | Status: DC | PRN
  Administered 2024-05-10: 1.8 mL via INTRATHECAL

## 2024-05-10 MED ORDER — CEFAZOLIN SODIUM-DEXTROSE 2-4 GM/100ML-% IV SOLN
2.0000 g | Freq: Four times a day (QID) | INTRAVENOUS | Status: AC
  Administered 2024-05-10 (×2): 2 g via INTRAVENOUS
  Filled 2024-05-10 (×2): qty 100

## 2024-05-10 MED ORDER — OXYCODONE HCL 5 MG/5ML PO SOLN: 5.0000 mg | Freq: Once | ORAL | Status: DC | PRN

## 2024-05-10 MED ORDER — OXYCODONE HCL 5 MG PO TABS
10.0000 mg | ORAL_TABLET | ORAL | Status: DC | PRN
  Administered 2024-05-11: 10 mg via ORAL
  Filled 2024-05-10 (×2): qty 2

## 2024-05-10 MED ORDER — HYDROMORPHONE HCL 1 MG/ML IJ SOLN
0.2500 mg | INTRAMUSCULAR | Status: DC | PRN
  Administered 2024-05-10 (×2): 0.5 mg via INTRAVENOUS

## 2024-05-10 MED ORDER — HYDROMORPHONE HCL 1 MG/ML IJ SOLN: 0.5000 mg | INTRAMUSCULAR | Status: DC | PRN

## 2024-05-10 MED ORDER — OXYCODONE HCL 5 MG PO TABS: 5.0000 mg | ORAL_TABLET | Freq: Once | ORAL | Status: DC | PRN

## 2024-05-10 MED ORDER — HYDROMORPHONE HCL 1 MG/ML IJ SOLN
INTRAMUSCULAR | Status: AC
  Filled 2024-05-10: qty 1

## 2024-05-10 MED ORDER — METOCLOPRAMIDE HCL 5 MG/ML IJ SOLN: 5.0000 mg | Freq: Three times a day (TID) | INTRAMUSCULAR | Status: DC | PRN

## 2024-05-10 MED ORDER — FENTANYL CITRATE (PF) 100 MCG/2ML IJ SOLN
INTRAMUSCULAR | Status: AC
  Filled 2024-05-10: qty 2

## 2024-05-10 MED ORDER — METHOCARBAMOL 500 MG PO TABS
500.0000 mg | ORAL_TABLET | Freq: Four times a day (QID) | ORAL | Status: DC | PRN
Start: 2024-05-10 — End: 2024-05-11
  Administered 2024-05-10 – 2024-05-11 (×2): 500 mg via ORAL
  Filled 2024-05-10 (×2): qty 1

## 2024-05-10 MED ORDER — ALUM & MAG HYDROXIDE-SIMETH 200-200-20 MG/5ML PO SUSP: 30.0000 mL | ORAL | Status: DC | PRN

## 2024-05-10 MED ORDER — ACETAMINOPHEN 500 MG PO TABS
1000.0000 mg | ORAL_TABLET | Freq: Once | ORAL | Status: AC
  Administered 2024-05-10: 1000 mg via ORAL
  Filled 2024-05-10: qty 2

## 2024-05-10 MED ORDER — ATORVASTATIN CALCIUM 10 MG PO TABS
20.0000 mg | ORAL_TABLET | Freq: Every day | ORAL | Status: DC
  Administered 2024-05-10: 20 mg via ORAL
  Filled 2024-05-10: qty 2

## 2024-05-10 MED ORDER — DOCUSATE SODIUM 100 MG PO CAPS
100.0000 mg | ORAL_CAPSULE | Freq: Two times a day (BID) | ORAL | Status: DC
  Administered 2024-05-10 – 2024-05-11 (×2): 100 mg via ORAL
  Filled 2024-05-10 (×2): qty 1

## 2024-05-10 MED ORDER — METOCLOPRAMIDE HCL 5 MG PO TABS: 5.0000 mg | ORAL_TABLET | Freq: Three times a day (TID) | ORAL | Status: DC | PRN

## 2024-05-10 MED ORDER — MIDAZOLAM HCL (PF) 2 MG/2ML IJ SOLN
INTRAMUSCULAR | Status: DC | PRN
  Administered 2024-05-10: 2 mg via INTRAVENOUS

## 2024-05-10 MED ORDER — ORAL CARE MOUTH RINSE: 15.0000 mL | Freq: Once | OROMUCOSAL | Status: AC

## 2024-05-10 MED ORDER — TRANEXAMIC ACID-NACL 1000-0.7 MG/100ML-% IV SOLN
1000.0000 mg | INTRAVENOUS | Status: AC
  Administered 2024-05-10: 1000 mg via INTRAVENOUS
  Filled 2024-05-10: qty 100

## 2024-05-10 MED ORDER — PHENYLEPHRINE HCL-NACL 20-0.9 MG/250ML-% IV SOLN
INTRAVENOUS | Status: DC | PRN
  Administered 2024-05-10: 15 ug/min via INTRAVENOUS

## 2024-05-10 MED ORDER — PROPOFOL 10 MG/ML IV BOLUS
INTRAVENOUS | Status: DC | PRN
  Administered 2024-05-10: 50 mg via INTRAVENOUS

## 2024-05-10 MED ORDER — CEFAZOLIN SODIUM-DEXTROSE 2-4 GM/100ML-% IV SOLN
2.0000 g | INTRAVENOUS | Status: AC
  Administered 2024-05-10: 2 g via INTRAVENOUS
  Filled 2024-05-10: qty 100

## 2024-05-10 MED ORDER — ALLOPURINOL 100 MG PO TABS
100.0000 mg | ORAL_TABLET | Freq: Every day | ORAL | Status: DC
  Administered 2024-05-10 – 2024-05-11 (×2): 100 mg via ORAL
  Filled 2024-05-10 (×2): qty 1

## 2024-05-10 MED ORDER — PHENOL 1.4 % MT LIQD
1.0000 | OROMUCOSAL | Status: DC | PRN
Start: 2024-05-10 — End: 2024-05-11

## 2024-05-10 MED ORDER — POVIDONE-IODINE 10 % EX SWAB
2.0000 | Freq: Once | CUTANEOUS | Status: AC
  Administered 2024-05-10: 2 via TOPICAL

## 2024-05-10 MED ORDER — ASPIRIN 81 MG PO CHEW
81.0000 mg | CHEWABLE_TABLET | Freq: Two times a day (BID) | ORAL | Status: DC
  Administered 2024-05-10 – 2024-05-11 (×2): 81 mg via ORAL
  Filled 2024-05-10 (×2): qty 1

## 2024-05-10 MED ORDER — METHOCARBAMOL 1000 MG/10ML IJ SOLN
500.0000 mg | Freq: Four times a day (QID) | INTRAMUSCULAR | Status: DC | PRN
Start: 2024-05-10 — End: 2024-05-11

## 2024-05-10 MED ORDER — MIDAZOLAM HCL 2 MG/2ML IJ SOLN
INTRAMUSCULAR | Status: AC
  Filled 2024-05-10: qty 2

## 2024-05-10 MED ORDER — PANTOPRAZOLE SODIUM 40 MG PO TBEC
40.0000 mg | DELAYED_RELEASE_TABLET | Freq: Every day | ORAL | Status: DC
  Administered 2024-05-10 – 2024-05-11 (×2): 40 mg via ORAL
  Filled 2024-05-10 (×2): qty 1

## 2024-05-10 MED ORDER — MENTHOL 3 MG MT LOZG: 1.0000 | LOZENGE | OROMUCOSAL | Status: DC | PRN

## 2024-05-10 MED ORDER — OXYCODONE HCL 5 MG PO TABS
5.0000 mg | ORAL_TABLET | ORAL | Status: DC | PRN
  Administered 2024-05-10 – 2024-05-11 (×4): 10 mg via ORAL
  Filled 2024-05-10 (×3): qty 2

## 2024-05-10 MED ORDER — ONDANSETRON HCL 4 MG/2ML IJ SOLN
INTRAMUSCULAR | Status: DC | PRN
Start: 2024-05-10 — End: 2024-05-10
  Administered 2024-05-10: 4 mg via INTRAVENOUS

## 2024-05-10 MED ORDER — ONDANSETRON HCL 4 MG PO TABS: 4.0000 mg | ORAL_TABLET | Freq: Four times a day (QID) | ORAL | Status: DC | PRN

## 2024-05-10 MED ORDER — SODIUM CHLORIDE 0.9 % IV SOLN: INTRAVENOUS | Status: DC

## 2024-05-10 MED ORDER — SODIUM CHLORIDE 0.9 % IR SOLN
Status: DC | PRN
  Administered 2024-05-10: 1000 mL

## 2024-05-10 MED ORDER — ONDANSETRON HCL 4 MG/2ML IJ SOLN: 4.0000 mg | Freq: Four times a day (QID) | INTRAMUSCULAR | Status: DC | PRN

## 2024-05-10 MED ORDER — PROPOFOL 500 MG/50ML IV EMUL
INTRAVENOUS | Status: DC | PRN
Start: 2024-05-10 — End: 2024-05-10
  Administered 2024-05-10: 100 ug/kg/min via INTRAVENOUS

## 2024-05-10 MED ORDER — FENTANYL CITRATE (PF) 100 MCG/2ML IJ SOLN
INTRAMUSCULAR | Status: DC | PRN
Start: 2024-05-10 — End: 2024-05-10
  Administered 2024-05-10: 25 ug via INTRAVENOUS

## 2024-05-10 MED ORDER — DIPHENHYDRAMINE HCL 12.5 MG/5ML PO ELIX: 12.5000 mg | ORAL_SOLUTION | ORAL | Status: DC | PRN

## 2024-05-10 MED ORDER — ACETAMINOPHEN 325 MG PO TABS: 325.0000 mg | ORAL_TABLET | Freq: Four times a day (QID) | ORAL | Status: DC | PRN

## 2024-05-10 MED ORDER — CHLORHEXIDINE GLUCONATE 0.12 % MT SOLN
15.0000 mL | Freq: Once | OROMUCOSAL | Status: AC
  Administered 2024-05-10: 15 mL via OROMUCOSAL
  Filled 2024-05-10: qty 15

## 2024-05-10 MED ORDER — AMISULPRIDE (ANTIEMETIC) 5 MG/2ML IV SOLN: 10.0000 mg | Freq: Once | INTRAVENOUS | Status: DC | PRN

## 2024-05-10 MED ORDER — 0.9 % SODIUM CHLORIDE (POUR BTL) OPTIME
TOPICAL | Status: DC | PRN
  Administered 2024-05-10: 1000 mL

## 2024-05-10 MED ORDER — LACTATED RINGERS IV SOLN: INTRAVENOUS | Status: DC

## 2024-05-10 SURGICAL SUPPLY — 42 items
BAG COUNTER SPONGE SURGICOUNT (BAG) ×1 IMPLANT
BENZOIN TINCTURE PRP APPL 2/3 (GAUZE/BANDAGES/DRESSINGS) ×1 IMPLANT
BLADE CLIPPER SURG (BLADE) IMPLANT
BLADE SAW SGTL 18X1.27X75 (BLADE) ×1 IMPLANT
COVER SURGICAL LIGHT HANDLE (MISCELLANEOUS) ×1 IMPLANT
CUP ACET PINNACLE SECTR 60MM (Hips) IMPLANT
DRAPE C-ARM 42X72 X-RAY (DRAPES) ×1 IMPLANT
DRAPE STERI IOBAN 125X83 (DRAPES) ×1 IMPLANT
DRAPE U-SHAPE 47X51 STRL (DRAPES) ×3 IMPLANT
DRSG AQUACEL AG ADV 3.5X10 (GAUZE/BANDAGES/DRESSINGS) ×1 IMPLANT
DRSG AQUACEL AG ADV 3.5X14 (GAUZE/BANDAGES/DRESSINGS) IMPLANT
DURAPREP 26ML APPLICATOR (WOUND CARE) ×1 IMPLANT
ELECT BLADE 6.5 EXT (BLADE) IMPLANT
ELECTRODE BLDE 4.0 EZ CLN MEGD (MISCELLANEOUS) ×1 IMPLANT
ELECTRODE REM PT RTRN 9FT ADLT (ELECTROSURGICAL) ×1 IMPLANT
FACESHIELD WRAPAROUND OR TEAM (MASK) ×2 IMPLANT
GLOVE BIOGEL PI IND STRL 8 (GLOVE) ×2 IMPLANT
GLOVE ECLIPSE 8.0 STRL XLNG CF (GLOVE) ×1 IMPLANT
GLOVE ORTHO TXT STRL SZ7.5 (GLOVE) ×2 IMPLANT
GOWN STRL REUS W/ TWL LRG LVL3 (GOWN DISPOSABLE) ×2 IMPLANT
GOWN STRL REUS W/ TWL XL LVL3 (GOWN DISPOSABLE) ×2 IMPLANT
HEAD CERAMIC 36 PLUS 8.5 12 14 (Hips) IMPLANT
KIT BASIN OR (CUSTOM PROCEDURE TRAY) ×1 IMPLANT
KIT TURNOVER KIT B (KITS) ×1 IMPLANT
LINER NEUTRAL 58X36MM PLUS4 IMPLANT
MANIFOLD NEPTUNE II (INSTRUMENTS) ×1 IMPLANT
PACK TOTAL JOINT (CUSTOM PROCEDURE TRAY) ×1 IMPLANT
PAD ARMBOARD POSITIONER FOAM (MISCELLANEOUS) ×1 IMPLANT
SET HNDPC FAN SPRY TIP SCT (DISPOSABLE) ×1 IMPLANT
SOLN 0.9% NACL POUR BTL 1000ML (IV SOLUTION) ×1 IMPLANT
SOLN STERILE WATER BTL 1000 ML (IV SOLUTION) ×2 IMPLANT
STAPLER SKIN PROX 35W (STAPLE) IMPLANT
STEM FEM ACTIS HIGH SZ8 (Stem) IMPLANT
STRIP CLOSURE SKIN 1/2X4 (GAUZE/BANDAGES/DRESSINGS) ×2 IMPLANT
SUT ETHIBOND NAB CT1 #1 30IN (SUTURE) ×1 IMPLANT
SUT MNCRL AB 4-0 PS2 18 (SUTURE) IMPLANT
SUT VIC AB 0 CT1 27XBRD ANBCTR (SUTURE) ×1 IMPLANT
SUT VIC AB 1 CT1 27XBRD ANBCTR (SUTURE) ×1 IMPLANT
SUT VIC AB 2-0 CT1 TAPERPNT 27 (SUTURE) ×1 IMPLANT
TOWEL GREEN STERILE (TOWEL DISPOSABLE) ×1 IMPLANT
TOWEL GREEN STERILE FF (TOWEL DISPOSABLE) ×1 IMPLANT
TRAY FOLEY W/BAG SLVR 16FR ST (SET/KITS/TRAYS/PACK) IMPLANT

## 2024-05-10 NOTE — Interval H&P Note (Signed)
 History and Physical Interval Note: The patient understands that he is here today for right total hip replacement to treat his significant right hip pain and arthritis.  There has been no acute or interval change in his medical status.  The risks and benefits of surgery have been discussed in detail and informed consent has been obtained.  The right operative hip has been marked.  05/10/2024 7:10 AM  Jeffrey Klein  has presented today for surgery, with the diagnosis of Osteoarthritis Right Hip.  The various methods of treatment have been discussed with the patient and family. After consideration of risks, benefits and other options for treatment, the patient has consented to  Procedure(s): ARTHROPLASTY, HIP, TOTAL, ANTERIOR APPROACH (Right) as a surgical intervention.  The patient's history has been reviewed, patient examined, no change in status, stable for surgery.  I have reviewed the patient's chart and labs.  Questions were answered to the patient's satisfaction.     [name]

## 2024-05-10 NOTE — Transfer of Care (Signed)
 Immediate Anesthesia Transfer of Care Note  Patient: Jeffrey Klein  Procedure(s) Performed: ARTHROPLASTY, HIP, TOTAL, ANTERIOR APPROACH (Right: Hip)  Patient Location: PACU  Anesthesia Type:Spinal  Level of Consciousness: drowsy and patient cooperative  Airway & Oxygen Therapy: Patient Spontanous Breathing  Post-op Assessment: Report given to RN and Post -op Vital signs reviewed and stable  Post vital signs: Reviewed and stable  Last Vitals:  Vitals Value Taken Time  BP 101/54 05/10/24 11:37  Temp    Pulse 77 05/10/24 11:39  Resp 18 05/10/24 11:39  SpO2 95 % 05/10/24 11:39  Vitals shown include unfiled device data.  Last Pain:  Vitals:   05/10/24 0730  TempSrc:   PainSc: 1       Patients Stated Pain Goal: 1 (05/10/24 0730)  Complications: No notable events documented.

## 2024-05-10 NOTE — Discharge Instructions (Signed)

## 2024-05-10 NOTE — Op Note (Signed)
 Operative Note  Date of operation: 05/10/2024 Preoperative diagnosis: Right hip primary osteoarthritis Postoperative diagnosis: Same  Procedure: Right direct anterior total hip arthroplasty  Implants: Implant Name Type Inv. Item Serial No. Manufacturer Lot No. LRB No. Used Action  CUP ACET PINNACLE SECTR - [id number] Hips CUP ACET PINNACLE SECTR  [employer] [id number] Right 1 Implanted  LINER NEUTRAL 58X36MM PLUS4 - [id number]  LINER NEUTRAL [id number] PLUS4  DEPUY ORTH[employer] [id number] Right 1 Implanted  STEM FEM ACTIS HIGH SZ8 - [id number] Stem STEM FEM ACTIS HIGH SZ8  DEPUY ORTH[employer] [id number] Right 1 Implanted  HEAD CERAMIC 36 PLUS 8.5 12 14  - [id number] Hips HEAD CERAMIC 36 PLUS 8.5 12 14   DEPU[employer] [id number] Right 1 Implanted   Surgeon: Lonni GRADE. Vernetta, MD Assistant: Tory Gaskins, PA-C  Anesthesia: Spinal Antibiotics: IV Ancef EBL: 150 cc Complications: None  Indications: The patient is a very active 63 year old gentleman with debilitating arthritis involving his right hip.  This has been well-documented with clinical exam and x-ray findings.  At this point his right hip pain is daily and it is definitely affecting his mobility, his quality of life and his actives daily living to the point he does wish proceed with a hip replacement on the right side.  We did discuss in length and detail the risks of acute blood loss anemia, nerve and vessel injury, fracture, infection, DVT, dislocation, implant failure, leg length differences and wound healing issues.  He understands that our goals are hopefully decreased pain, improved mobility and improve quality of life.  Procedure description: After informed consent was obtained and the appropriate right hip was marked, the patient was brought to the op room and set up on the stretcher where spinal anesthesia was obtained.  He was laid in the supine position on stretcher and a Foley catheter was placed.  Traction boots  were placed on both his feet and next he was placed supine on the Hana fracture table with a perineal post and placed in both legs in inline skeletal traction devices no traction applied.  His right operative hip and pelvis were assessed radiographically.  The right hip was prepped and draped with DuraPrep and sterile drapes.  A timeout was called and he was identified as the correct patient and correct right hip.  An incision was then made just inferior and posterior to the ASIS and carried slightly obliquely down the leg.  Dissection was carried down to the tensor fascia lata muscle and the tensor fascia was then divided longitudinally to proceed with a direct tender approach the hip.  Circumflex vessels were identified and cauterized.  The hip capsule was identified and opened up in L-type format finding a moderate joint effusion.  Cobra retractors were placed around the medial and lateral femoral neck and a femoral neck cut was made with an oscillating saw just proximal to the lesser trochanter and this cut was completed with an osteotome.  A corkscrew guide was placed in the femoral head and the femoral head was removed in its entirety and it was a very large femoral head that was significantly devoid of cartilage with both osteoarthritis and osteonecrosis.  A bent Hohmann was then placed over the medial acetabular rim and remanence of the acetabular labrum and other debris removed.  Reaming was then initiated from a size 43 reamer and stepwise increments going up to a size 59 reamer with all reamers placed under direct visualization and the last reamer also placed under direct fluoroscopy in  order to obtain the depth reaming, the inclination and the anteversion.  The real DePuy sector GRIPTION acetabular component size 60 was then placed without difficulty followed by a 36+4 polyethylene liner.  Attention was then turned to the femur.  With the right leg externally rotated to 120 degrees, extended and  adducted, a Mueller retractor was placed medially and a Hohmann tractor by the greater trochanter.  The lateral joint capsule was released and a box cutting osteotome was used to enter the femoral canal.  Broaching was initiated using the Actis broaching system from a size 0 going up to a size 8.  With a size 8 in place we trialed a high offset femoral neck based off his anatomy and a 36+1.5 trial head ball.  The right leg was brought over and up and with traction and into rotation reduced in the pelvis and it really look like we needed more leg length.  Also on exam there was some instability.  We dislocated the hip remove the trial components.  We then placed the real Actis femoral component with high offset size 8 and with the real 36+8.5 ceramic head ball.  Again this was reduced in the pelvis and we are pleased with leg length, offset, range of motion and stability assessing this radiographically and clinically.  The soft tissue was then irrigated with normal saline solution.  Remnants of the joint capsule were closed with interrupted #1 Ethibond suture followed by #1 Vicryl to close the tensor fascia.  0 Vicryl used to those the deep tissue and 2-0 Vicryl was used to close subcutaneous tissue.  The skin was closed with staples.  An Aquacel dressing was applied.  The patient was taken off the Hana table and taken the recovery room.  Tory Gaskins, PA-C did assist during the entire case and beginning the end and his assistance was crucial and medically necessary for soft tissue management and retraction, helping guide implant placement and a layered closure of the wound.

## 2024-05-10 NOTE — Evaluation (Signed)
 Physical Therapy Evaluation Patient Details Name: Jeffrey Klein MRN: [id number] DOB: 09/11/1960 Today's Date: 05/10/2024  History of Present Illness  Pt is 63 yo male who presents on 05/10/24 for R direct anterior THA due to OA. PMH: gout, GERD, psoriasis  Clinical Impression  Pt admitted with above diagnosis. Pt from home alone but has a friend that can take him home and stay the first night. Pt was independent and working PTA. Pt with some cramping R quad today but decreased with ambulation. Pt mobilizing at supervision level, ambulated 59' with RW. Recommend RW for home.  Pt currently with functional limitations due to the deficits listed below (see PT Problem List). Pt will benefit from acute skilled PT to increase their independence and safety with mobility to allow discharge.           If plan is discharge home, recommend the following: Assist for transportation;Assistance with cooking/housework   Can travel by private [employer] (2 wheels)  Recommendations for Other Services       Functional Status Assessment Patient has had a recent decline in their functional status and demonstrates the ability to make significant improvements in function in a reasonable and predictable amount of time.     Precautions / Restrictions Precautions Precautions: None Recall of Precautions/Restrictions: Intact Restrictions Weight Bearing Restrictions Per Provider Order: No      Mobility  Bed Mobility Overal bed mobility: Needs Assistance Bed Mobility: Supine to Sit, Sit to Supine     Supine to sit: Supervision Sit to supine: Supervision   General bed mobility comments: pt able to come to EOB with increased time and able to lift RLE for return to supine    Transfers Overall transfer level: Needs assistance Equipment used: Rolling walker (2 wheels) Transfers: Sit to/from Stand Sit to Stand: Contact guard assist           General  transfer comment: CGA for safety as pt with increased shivering since surgery and a bit anxious. No R knee buckling in standing    Ambulation/Gait Ambulation/Gait assistance: Contact guard assist Gait Distance (Feet): 80 Feet Assistive device: Rolling walker (2 wheels) Gait Pattern/deviations: Step-through pattern, Decreased weight shift to right Gait velocity: decreased Gait velocity interpretation: 1.31 - 2.62 ft/sec, indicative of limited community ambulator Pre-gait activities: standing wt shifting General Gait Details: increased wt through RW, pt denies significant increase in pain with wt bearing  Stairs            Wheelchair Mobility     Tilt Bed    Modified Rankin (Stroke Patients Only)       Balance Overall balance assessment: No apparent balance deficits (not formally assessed)                                           Pertinent Vitals/Pain Pain Assessment Pain Assessment: Faces Faces Pain Scale: Hurts a little bit Pain Location: R hip Pain Descriptors / Indicators: Operative site guarding Pain Intervention(s): Limited activity within patient's tolerance, Monitored during session    Home Living Family/patient expects to be discharged to:: Private residence Living Arrangements: Alone Available Help at Discharge: Friend(s);Available PRN/intermittently Type of Home: House Home Access: Stairs to enter Entrance Stairs-Rails: None Entrance Stairs-Number of Steps: 1   Home Layout: One level Home Equipment: Cane - single point Additional Comments: has a  friend that will take him home and stay first night    Prior Function Prior Level of Function : Independent/Modified Independent;Working/employed;Driving             Mobility Comments: independent ADLs Comments: independent     Extremity/Trunk Assessment   Upper Extremity Assessment Upper Extremity Assessment: Overall WFL for tasks assessed    Lower Extremity  Assessment Lower Extremity Assessment: RLE deficits/detail RLE Deficits / Details: hip flex 2/5, knee ext 3/5. Having some cramping of R quad RLE Sensation: WNL RLE Coordination: decreased gross motor    Cervical / Trunk Assessment Cervical / Trunk Assessment: Normal  Communication   Communication Communication: No apparent difficulties    Cognition Arousal: Alert Behavior During Therapy: WFL for tasks assessed/performed   PT - Cognitive impairments: No apparent impairments                         Following commands: Intact       Cueing Cueing Techniques: Verbal cues     General Comments General comments (skin integrity, edema, etc.): cues for breathing, pt tends to hold breath. Exercise handout given. Pt wanted to try L SL end of session, assisted him with pillow between knees, pt tolerated well    Exercises Total Joint Exercises Ankle Circles/Pumps: AROM, Both, 10 reps, Supine Quad Sets: AROM, Both, 10 reps, Supine Heel Slides: AROM, Right, 10 reps, Supine Long Arc Quad: AROM, Right, 10 reps, Seated   Assessment/Plan    PT Assessment Patient needs continued PT services  PT Problem List Decreased strength;Decreased activity tolerance;Decreased balance;Decreased mobility;Decreased range of motion;Pain;Decreased knowledge of use of DME       PT Treatment Interventions DME instruction;Gait training;Stair training;Functional mobility training;Therapeutic activities;Therapeutic exercise;Balance training;Neuromuscular re-education;Patient/family education    PT Goals (Current goals can be found in the Care Plan section)  Acute Rehab PT Goals Patient Stated Goal: return to home and work PT Goal Formulation: With patient Time For Goal Achievement: 05/17/24 Potential to Achieve Goals: Good    Frequency Min 3X/week     Co-evaluation               AM-PAC PT 6 Clicks Mobility  Outcome Measure Help needed turning from your back to your side while in a  flat bed without using bedrails?: None Help needed moving from lying on your back to sitting on the side of a flat bed without using bedrails?: None Help needed moving to and from a bed to a chair (including a wheelchair)?: None Help needed standing up from a chair using your arms (e.g., wheelchair or bedside chair)?: A Little Help needed to walk in hospital room?: A Little Help needed climbing 3-5 steps with a railing? : A Little 6 Click Score: 21    End of Session Equipment Utilized During Treatment: Gait belt Activity Tolerance: Patient tolerated treatment well Patient left: in bed;with call bell/phone within reach Nurse Communication: Mobility status PT Visit Diagnosis: Difficulty in walking, not elsewhere classified (R26.2);Pain Pain - Right/Left: Right Pain - part of body: Hip    Time: 8444-8367 PT Time Calculation (min) (ACUTE ONLY): 37 min   Charges:   PT Evaluation $PT Eval Low Complexity: 1 Low PT Treatments $Gait Training: 8-22 mins PT General Charges $$ ACUTE PT VISIT: 1 Visit         Richerd Lipoma, PT  Acute Rehab Services Secure chat preferred Office [phone]   Richerd CROME Ladanian Kelter 05/10/2024, 4:55 PM

## 2024-05-10 NOTE — Anesthesia Procedure Notes (Signed)
 Procedure Name: MAC Date/Time: 05/10/2024 9:54 AM  Performed by: Lamar Lucie DASEN, CRNAPre-anesthesia Checklist: Patient identified, Emergency Drugs available, Suction available and Patient being monitored Patient Re-evaluated:Patient Re-evaluated prior to induction Oxygen Delivery Method: Simple face mask

## 2024-05-10 NOTE — Anesthesia Postprocedure Evaluation (Signed)
 Anesthesia Post Note  Patient: Jeffrey Klein  Procedure(s) Performed: ARTHROPLASTY, HIP, TOTAL, ANTERIOR APPROACH (Right: Hip)     Patient location during evaluation: PACU Anesthesia Type: MAC and Spinal Level of consciousness: awake Pain management: pain level controlled Vital Signs Assessment: post-procedure vital signs reviewed and stable Respiratory status: spontaneous breathing, nonlabored ventilation and respiratory function stable Cardiovascular status: stable and blood pressure returned to baseline Postop Assessment: no apparent nausea or vomiting, no headache, no backache and spinal receding Anesthetic complications: no   No notable events documented.  Last Vitals:  Vitals:   05/10/24 1315 05/10/24 1330  BP: (!) 108/59 117/72  Pulse: 65 69  Resp: 19 (!) 21  Temp:    SpO2: 94% 100%    Last Pain:  Vitals:   05/10/24 1315  TempSrc:   PainSc: 0-No pain    LLE Motor Response: Purposeful movement (05/10/24 1330)   RLE Motor Response: Purposeful movement (05/10/24 1330)   L Sensory Level: L5-Outer lower leg, top of foot, great toe (05/10/24 1330) R Sensory Level: L5-Outer lower leg, top of foot, great toe (05/10/24 1330)  Delon Aisha Arch

## 2024-05-10 NOTE — Anesthesia Procedure Notes (Signed)
 Spinal  Patient location during procedure: OR Start time: 05/10/2024 9:55 AM End time: 05/10/2024 10:00 AM Reason for block: surgical anesthesia Staffing Performed: anesthesiologist  Anesthesiologist: Peggye Delon Brunswick, MD Performed by: Peggye Delon Brunswick, MD Authorized by: Peggye Delon Brunswick, MD   Preanesthetic Checklist Completed: patient identified, IV checked, site marked, risks and benefits discussed, surgical consent, monitors and equipment checked, pre-op evaluation and timeout performed Spinal Block Patient position: sitting Prep: DuraPrep Patient monitoring: blood pressure and continuous pulse ox Approach: midline Location: L2-3 Injection technique: single-shot Needle Needle type: Pencan  Needle gauge: 24 G Needle length: 9 cm Additional Notes Risks and benefits of neuraxial anesthesia including, but not limited to, infection, bleeding, local anesthetic toxicity, headache, hypotension, back pain, block failure, etc. were discussed with the patient. The patient expressed understanding and consented to the procedure. I confirmed that the patient has no bleeding disorders and is not taking blood thinners. I confirmed the patient's last platelet count with the nurse. Monitors were applied. A time-out was performed immediately prior to the procedure. Sterile technique was used throughout the whole procedure.   1 attempt(s)

## 2024-05-11 ENCOUNTER — Other Ambulatory Visit (HOSPITAL_COMMUNITY): Payer: Self-pay

## 2024-05-11 ENCOUNTER — Encounter (HOSPITAL_COMMUNITY): Payer: Self-pay | Admitting: Orthopaedic Surgery

## 2024-05-11 DIAGNOSIS — M1611 Unilateral primary osteoarthritis, right hip: Secondary | ICD-10-CM | POA: Diagnosis not present

## 2024-05-11 LAB — CBC
HCT: 39 % (ref 39.0–52.0)
Hemoglobin: 13 g/dL (ref 13.0–17.0)
MCH: 29.5 pg (ref 26.0–34.0)
MCHC: 33.3 g/dL (ref 30.0–36.0)
MCV: 88.4 fL (ref 80.0–100.0)
Platelets: 288 K/uL (ref 150–400)
RBC: 4.41 MIL/uL (ref 4.22–5.81)
RDW: 12.8 % (ref 11.5–15.5)
WBC: 8.7 K/uL (ref 4.0–10.5)
nRBC: 0 % (ref 0.0–0.2)

## 2024-05-11 LAB — BASIC METABOLIC PANEL WITH GFR
Anion gap: 10 (ref 5–15)
BUN: 9 mg/dL (ref 8–23)
CO2: 26 mmol/L (ref 22–32)
Calcium: 8.5 mg/dL — ABNORMAL LOW (ref 8.9–10.3)
Chloride: 99 mmol/L (ref 98–111)
Creatinine, Ser: 1.07 mg/dL (ref 0.61–1.24)
GFR, Estimated: >60 mL/min (ref >60)
Glucose, Bld: 147 mg/dL — ABNORMAL HIGH (ref 70–99)
Potassium: 3.9 mmol/L (ref 3.5–5.1)
Sodium: 135 mmol/L (ref 135–145)

## 2024-05-11 MED ORDER — OXYCODONE HCL 5 MG PO TABS
5.0000 mg | ORAL_TABLET | Freq: Four times a day (QID) | ORAL | 0 refills | Status: DC | PRN
  Filled 2024-05-11: qty 30, 4d supply, fill #0

## 2024-05-11 MED ORDER — METHOCARBAMOL 500 MG PO TABS
500.0000 mg | ORAL_TABLET | Freq: Four times a day (QID) | ORAL | 0 refills | Status: DC | PRN
  Filled 2024-05-11: qty 30, 8d supply, fill #0

## 2024-05-11 MED ORDER — ASPIRIN 81 MG PO CHEW
81.0000 mg | CHEWABLE_TABLET | Freq: Two times a day (BID) | ORAL | 0 refills | Status: AC
  Filled 2024-05-11: qty 30, 15d supply, fill #0

## 2024-05-11 NOTE — Plan of Care (Signed)
 Pt doing well. Pt given D/C instructions with verbal understanding. Rx's were sent to the pharmacy by MD. Pt's incision is clean and dry with no sign of infection. Pt's IV was removed prior to D/C. Pt received RW from [employer] per MD order. Pt D/C'd home via wheelchair per MD order. Pt is stable @ D/C and has no other needs at this time. Rema Fendt, RN

## 2024-05-11 NOTE — Care Management Obs Status (Signed)
 MEDICARE OBSERVATION STATUS NOTIFICATION   Patient Details  Name: Jeffrey Klein MRN: [id number] Date of Birth: [birth year]   Medicare Observation Status Notification Given:  Yes    [name], RN 05/11/2024, 11:52 AM

## 2024-05-11 NOTE — Discharge Summary (Signed)
 Patient ID: Jeffrey Klein MRN: [id number] DOB/AGE: 1961/04/13 63 y.o.  Admit date: 05/10/2024 Discharge date: 05/11/2024  Admission Diagnoses:  Principal Problem:   Unilateral primary osteoarthritis, right hip Active Problems:   Status post total replacement of right hip   Discharge Diagnoses:  Same  Past Medical History:  Diagnosis Date   Diverticulosis    diverticulitis in 2018, no recurrence   GERD (gastroesophageal reflux disease)    High cholesterol    Psoriasis    Psoriatic arthritis (HCC)     Surgeries: Procedure(s): ARTHROPLASTY, HIP, TOTAL, ANTERIOR APPROACH on 05/10/2024   Consultants:   Discharged Condition: Improved  Hospital Course: Jeffrey Klein is an 63 y.o. male who was admitted 05/10/2024 for operative treatment ofUnilateral primary osteoarthritis, right hip. Patient has severe unremitting pain that affects sleep, daily activities, and work/hobbies. After pre-op clearance the patient was taken to the operating room on 05/10/2024 and underwent  Procedure(s): ARTHROPLASTY, HIP, TOTAL, ANTERIOR APPROACH.    Patient was given perioperative antibiotics:  Anti-infectives (From admission, onward)    Start     Dose/Rate Route Frequency Ordered Stop   05/10/24 1600  ceFAZolin (ANCEF) IVPB 2g/100 mL premix        2 g 200 mL/hr over 30 Minutes Intravenous Every 6 hours 05/10/24 1431 05/10/24 2052   05/10/24 0645  ceFAZolin (ANCEF) IVPB 2g/100 mL premix        2 g 200 mL/hr over 30 Minutes Intravenous On call to O.R. 05/10/24 9365 05/10/24 1031        Patient was given sequential compression devices, early ambulation, and chemoprophylaxis to prevent DVT.  Inpatient Morphine Milligram Equivalents Per Day 11/4 - 11/5   Values displayed are in units of MME/Day    Order Start / End Date Yesterday Today    oxyCODONE (Oxy IR/ROXICODONE) immediate release tablet 5 mg 11/4 - 11/4 0 of Unknown --    oxyCODONE (ROXICODONE) 5 MG/5ML solution 5 mg 11/4 - 11/4 0 of Unknown  --      Group total: 0 of Unknown     HYDROmorphone (DILAUDID) injection 0.25-0.5 mg 11/4 - 11/4 20 of 40-80 --    fentaNYL (SUBLIMAZE) injection 11/4 - 11/4 *7.5 of 7.5 --    HYDROmorphone (DILAUDID) injection 0.5-1 mg 11/4 - No end date 0 of 30-60 0 of 60-120    oxyCODONE (Oxy IR/ROXICODONE) immediate release tablet 5-10 mg 11/4 - No end date 30 of 22.5-45 30 of 45-90    oxyCODONE (Oxy IR/ROXICODONE) immediate release tablet 10-15 mg 11/4 - No end date 0 of 45-67.5 15 of 90-135    Daily Totals  * 57.5 of Unknown (at least 145-260) 45 of 195-345  *One-Step medication  Calculation Errors     Order Type Date Details   oxyCODONE (Oxy IR/ROXICODONE) immediate release tablet 5 mg Ordered Dose -- Insufficient frequency information   oxyCODONE (ROXICODONE) 5 MG/5ML solution 5 mg Ordered Dose -- Insufficient frequency information            Patient benefited maximally from hospital stay and there were no complications.    Recent vital signs: Patient Vitals for the past 24 hrs:  BP Temp Temp src Pulse Resp SpO2  05/11/24 0739 130/69 -- -- 96 18 95 %  05/11/24 0406 129/68 99.1 F (37.3 C) Oral 91 18 96 %  05/10/24 2309 133/66 99.5 F (37.5 C) -- 96 18 96 %  05/10/24 1909 111/67 98.9 F (37.2 C) Oral 86 18 100 %  05/10/24 1719 125/69 -- --  94 18 100 %  05/10/24 1424 121/64 98.4 F (36.9 C) Oral 79 16 100 %  05/10/24 1400 118/67 97.7 F (36.5 C) -- 63 12 97 %  05/10/24 1345 112/62 -- -- 61 12 99 %  05/10/24 1330 117/72 -- -- 69 (!) 21 100 %  05/10/24 1315 (!) 108/59 -- -- 65 19 94 %  05/10/24 1301 103/69 -- -- 64 16 94 %  05/10/24 1300 -- -- -- 63 14 100 %     Recent laboratory studies:  Recent Labs    05/11/24 0550  WBC 8.7  HGB 13.0  HCT 39.0  PLT 288  NA 135  K 3.9  CL 99  CO2 26  BUN 9  CREATININE 1.07  GLUCOSE 147*  CALCIUM  8.5*     Discharge Medications:   Allergies as of 05/11/2024   No Known Allergies      Medication List     STOP taking these  medications    aspirin EC 81 MG tablet Replaced by: Aspirin Low Dose 81 MG chewable tablet       TAKE these medications    allopurinol  100 MG tablet Commonly known as: ZYLOPRIM  TAKE ONE TABLET BY MOUTH ONCE A DAY   Aspirin Low Dose 81 MG chewable tablet Generic drug: aspirin Chew 1 tablet (81 mg total) by mouth 2 (two) times daily. Replaces: aspirin EC 81 MG tablet   atorvastatin  20 MG tablet Commonly known as: LIPITOR Take 1 tablet (20 mg total) by mouth daily. What changed: when to take this   betamethasone  valerate 0.1 % cream Commonly known as: VALISONE  Apply topically once daily as needed   JOINT SUPPORT PO Take 1 tablet by mouth daily.   methocarbamol 500 MG tablet Commonly known as: ROBAXIN Take 1 tablet (500 mg total) by mouth every 6 (six) hours as needed for muscle spasms.   oxyCODONE 5 MG immediate release tablet Commonly known as: Oxy IR/ROXICODONE Take 1-2 tablets (5-10 mg total) by mouth every 6 (six) hours as needed for moderate pain (pain score 4-6).   Taltz  80 MG/ML injection Generic drug: ixekizumab  INJECT 1 SYRINGE SUBCUTANEOUSLY  EVERY 4 WEEKS   Testosterone  12.5 MG/ACT (1%) Gel APPLY FOUR PUMPS TOPICALLY TO THE SKIN DAILY What changed:  how much to take how to take this when to take this additional instructions   VITAMIN D -3 PO Take 1 capsule by mouth daily.               Durable Medical Equipment  (From admission, onward)           Start     Ordered   05/10/24 1432  DME 3 n 1  Once        05/10/24 1431   05/10/24 1432  DME Walker rolling  Once       Question Answer Comment  Walker: With 5 Inch Wheels   Patient needs a walker to treat with the following condition Status post total replacement of right hip      05/10/24 1431            Diagnostic Studies: DG HIP UNILAT WITH PELVIS 1V RIGHT Result Date: 05/10/2024 EXAM: 1 VIEW XRAY OF THE RIGHT HIP 05/10/2024 11:13:00 AM COMPARISON: None available. CLINICAL  HISTORY: Pelvic radiographs 05/28/2020. Elective surgery FINDINGS: Status post right total hip arthroplasty. Expected gas and edema in the overlying subcutaneous soft tissues. No evidence of hardware complication. no acute fracture or malalignment. IMPRESSION: 1. Right total hip arthroplasty  without evidence of immediate complication. Electronically signed by: Gilmore Molt MD 05/10/2024 06:32 PM EST RP Workstation: [id number]   DG Pelvis Portable Result Date: 05/10/2024 EXAM: 1 VIEW XRAY OF THE RIGHT HIP 05/10/2024 11:13:00 AM COMPARISON: None available. CLINICAL HISTORY: Pelvic radiographs 05/28/2020. Elective surgery FINDINGS: Status post right total hip arthroplasty. Expected gas and edema in the overlying subcutaneous soft tissues. No evidence of hardware complication. no acute fracture or malalignment. IMPRESSION: 1. Right total hip arthroplasty without evidence of immediate complication. Electronically signed by: Gilmore Molt MD 05/10/2024 06:32 PM EST RP Workstation: [id number]   DG C-Arm 1-60 Min-No Report Result Date: 05/10/2024 Fluoroscopy was utilized by the requesting physician.  No radiographic interpretation.    Disposition: Discharge disposition: 01-Home or Self Care          Follow-up Information     [facility] Follow up.   Specialty: Home Health Services Why: [facility] will contact you for the first home visit Contact information: [address] [place] [address] [phone]         Vernetta Lonni GRADE, MD Follow up in 2 week(s).   Specialty: Orthopedic Surgery Contact information: [address] [place]  [place] [address] [phone]                  Signed: Lonni GRADE Vernetta 05/11/2024, 12:55 PM

## 2024-05-11 NOTE — Progress Notes (Signed)
 Physical Therapy Treatment Patient Details Name: Jeffrey Klein MRN: [id number] DOB: 1960-10-17 Today's Date: 05/11/2024   History of Present Illness Pt is 63 yo male who presents on 05/10/24 for R direct anterior THA due to OA. PMH: gout, GERD, psoriasis    PT Comments  Pt received in supine and agreeable to PT session. Pt modI for bed mobility, demonstrating good technique crossing ankles to move R LE onto and off bed. Pt supervision for STS and ambulation today. Able to ambulate 150' with RW at supervision level, with pt reporting 7/10 pain in R hip throughout. Simulated stepping up one step by instructing pt to step over threshold in the floor, which pt able carry over well. Discussed importance of daily mobility and performing exercise program as able. Will follow physician's recommendation for d/c plan and follow up therapy.    If plan is discharge home, recommend the following: Assist for transportation;Assistance with cooking/housework   Can travel by Training And Development Officer (2 wheels)    Recommendations for Other Services       Precautions / Restrictions Precautions Precautions: None Recall of Precautions/Restrictions: Intact Restrictions Weight Bearing Restrictions Per Provider Order: Yes RLE Weight Bearing Per Provider Order: Weight bearing as tolerated     Mobility  Bed Mobility Overal bed mobility: Modified Independent Bed Mobility: Supine to Sit, Sit to Supine     Supine to sit: Modified independent (Device/Increase time) Sit to supine: Modified independent (Device/Increase time)   General bed mobility comments: pt able to come to EOB with increased time and able to lift RLE for return to supine    Transfers Overall transfer level: Needs assistance Equipment used: Rolling walker (2 wheels) Transfers: Sit to/from Stand Sit to Stand: Supervision           General transfer comment: Pt supervision for STS, requiring  only inc time to stand and request to maintain in static standing to avoid dizziness or lightheadedness.    Ambulation/Gait Ambulation/Gait assistance: Supervision Gait Distance (Feet): 150 Feet Assistive device: Rolling walker (2 wheels) Gait Pattern/deviations: Step-through pattern, Decreased weight shift to right Gait velocity: decreased Gait velocity interpretation: <1.8 ft/sec, indicate of risk for recurrent falls   General Gait Details: Pt demonstrated good sequencing with RW during gait   Stairs             Wheelchair Mobility     Tilt Bed    Modified Rankin (Stroke Patients Only)       Balance Overall balance assessment: No apparent balance deficits (not formally assessed)                                          Communication Communication Communication: No apparent difficulties  Cognition Arousal: Alert Behavior During Therapy: WFL for tasks assessed/performed   PT - Cognitive impairments: No apparent impairments                         Following commands: Intact      Cueing Cueing Techniques: Verbal cues  Exercises Total Joint Exercises Ankle Circles/Pumps: AROM, Both, 10 reps, Supine Quad Sets: AROM, Both, 10 reps, Supine Heel Slides: AROM, Right, 10 reps, Supine Hip ABduction/ADduction: AROM, Both, Supine, 5 reps Long Arc Quad: AROM, Both, 10 reps, Seated    General Comments General comments (skin integrity, edema,  etc.): Appears motivated to improve functional mobility. Gave education regarding pain levels and light activity at home      Pertinent Vitals/Pain Pain Assessment Pain Assessment: 0-10 Pain Score: 7  Pain Location: R hip Pain Descriptors / Indicators: Burning, Guarding Pain Intervention(s): Limited activity within patient's tolerance, Monitored during session    Home Living                          Prior Function            PT Goals (current goals can now be found in the care  plan section) Acute Rehab PT Goals Patient Stated Goal: return to home and work PT Goal Formulation: With patient Time For Goal Achievement: 05/17/24 Potential to Achieve Goals: Good Progress towards PT goals: Progressing toward goals    Frequency    Min 5X/week      PT Plan      Co-evaluation              AM-PAC PT 6 Clicks Mobility   Outcome Measure  Help needed turning from your back to your side while in a flat bed without using bedrails?: None Help needed moving from lying on your back to sitting on the side of a flat bed without using bedrails?: None Help needed moving to and from a bed to a chair (including a wheelchair)?: None Help needed standing up from a chair using your arms (e.g., wheelchair or bedside chair)?: A Little Help needed to walk in hospital room?: A Little Help needed climbing 3-5 steps with a railing? : A Little 6 Click Score: 21    End of Session Equipment Utilized During Treatment: Gait belt Activity Tolerance: Patient tolerated treatment well Patient left: in bed;with call bell/phone within reach Nurse Communication: Mobility status PT Visit Diagnosis: Difficulty in walking, not elsewhere classified (R26.2);Pain Pain - Right/Left: Right Pain - part of body: Hip     Time: 9152-9084 PT Time Calculation (min) (ACUTE ONLY): 28 min  Charges:    $Therapeutic Exercise: 8-22 mins $Therapeutic Activity: 8-22 mins PT General Charges $$ ACUTE PT VISIT: 1 Visit                     Derelle Cockrell, SPT    Conleigh Heinlein 05/11/2024, 11:01 AM

## 2024-05-11 NOTE — Progress Notes (Signed)
 Subjective: 1 Day Post-Op Procedure(s) (LRB): ARTHROPLASTY, HIP, TOTAL, ANTERIOR APPROACH (Right) Patient reports pain as moderate.    Objective: Vital signs in last 24 hours: Temp:  [97.6 F (36.4 C)-99.5 F (37.5 C)] 99.1 F (37.3 C) (11/05 0406) Pulse Rate:  [54-96] 91 (11/05 0406) Resp:  [11-21] 18 (11/05 0406) BP: (92-133)/(50-72) 129/68 (11/05 0406) SpO2:  [94 %-100 %] 96 % (11/05 0406)  Intake/Output from previous day: 11/04 0701 - 11/05 0700 In: 1400 [I.V.:1200; IV Piggyback:200] Out: 1100 [Urine:950; Blood:150] Intake/Output this shift: No intake/output data recorded.  Recent Labs    05/11/24 0550  HGB 13.0   Recent Labs    05/11/24 0550  WBC 8.7  RBC 4.41  HCT 39.0  PLT 288   Recent Labs    05/11/24 0550  NA 135  K 3.9  CL 99  CO2 26  BUN 9  CREATININE 1.07  GLUCOSE 147*  CALCIUM  8.5*   No results for input(s): LABPT, INR in the last 72 hours.  Sensation intact distally Intact pulses distally Dorsiflexion/Plantar flexion intact Incision: dressing C/D/I   Assessment/Plan: 1 Day Post-Op Procedure(s) (LRB): ARTHROPLASTY, HIP, TOTAL, ANTERIOR APPROACH (Right) Up with therapy Discharge home with home health      Jeffrey Klein 05/11/2024, 7:33 AM

## 2024-05-16 NOTE — Telephone Encounter (Signed)
 Received prescription clarification request from [facility]. Patient does not need loading dose  Faxed: [phone]

## 2024-05-17 MED ORDER — TALTZ 80 MG/ML ~~LOC~~ SOSY: 80.0000 mg | PREFILLED_SYRINGE | SUBCUTANEOUS | Status: AC

## 2024-05-17 NOTE — Telephone Encounter (Signed)
 Called [employer] for update on Taltz  pt assistance application. Per [employer] rep regarding an approval for TALTZ  patient assistance from 05/11/2024 to 07/06/2025. Rep will place request for approval letter to be faxed to our office.  Phone: [phone] Fax: [phone]  MyChart message sent to patient. He had arthroplasty on 05/10/2024 so is holding Taltz .   Sherry Pennant, PharmD, MPH, BCPS, CPP Clinical Pharmacist [facility])

## 2024-05-23 ENCOUNTER — Encounter: Payer: Self-pay | Admitting: Orthopaedic Surgery

## 2024-05-23 ENCOUNTER — Ambulatory Visit: Admitting: Orthopaedic Surgery

## 2024-05-23 DIAGNOSIS — Z96641 Presence of right artificial hip joint: Secondary | ICD-10-CM

## 2024-05-23 NOTE — Progress Notes (Signed)
 The patient is here today for his first postoperative visit status post a right total hip arthroplasty.  He is already ambulating without an assistive device.  He is taking a baby aspirin twice daily and has been wearing compression socks.  Overall he looks great and ahead of most people.  He is an active 63 year old.  His right hip looks good.  Staples are removed and Steri-Strips applied.  His leg lengths are near equal.  He has good mobility overall.  He will continue to slowly increase his activities as comfort allows.  We will see him back in a month to see how he is doing overall from a mobility standpoint but no x-rays are needed.

## 2024-05-30 ENCOUNTER — Encounter: Payer: Self-pay | Admitting: Orthopaedic Surgery

## 2024-06-20 ENCOUNTER — Ambulatory Visit: Admitting: Orthopaedic Surgery

## 2024-06-20 DIAGNOSIS — Z96641 Presence of right artificial hip joint: Secondary | ICD-10-CM

## 2024-06-20 NOTE — Progress Notes (Signed)
 The patient is a 63 year old gentleman who is now 6 weeks status post a right total hip arthroplasty.  He has had some flareup of his left knee hurting him.  He has remote history of a left knee arthroscopy done in [place] many years ago for the left knee.  He states is not bad enough right now to have any type of injection.  He says the right hip replacement is doing well.  He does describe what he is feeling around his incision in terms of scar tissue and some numbness and pain on the lateral aspect of his right hip but overall his mobility as well.  Even demonstrated some of the exercises that he is performing.  He says his ADLs are getting easier and he was able to clip his toenails recently.  He is walking with a more normal gait.  His leg lengths and are equal.  He tolerates by putting his right hip through internal and external rotation.  His left knee does have some varus malalignment and some global tenderness mainly at the medial joint line and there is a slight effusion and warmth to.  I was able to pull up later x-rays of his left knee done in 2021 that did show tricompartment arthritis.  He will continue to increase his activities as comfort allows.  The next time we need to see him is not for 6 months unless there are issues.  Will have a standing AP pelvis and lateral of his right hip at that visit as well as an AP and lateral of his left knee.

## 2024-06-21 ENCOUNTER — Ambulatory Visit: Admitting: Family Medicine

## 2024-07-12 ENCOUNTER — Other Ambulatory Visit: Payer: Self-pay | Admitting: Family Medicine

## 2024-07-26 NOTE — Progress Notes (Signed)
 "  Office Visit Note  Patient: Jeffrey Klein             Date of Birth: 1961-03-29           MRN: [id number]             PCP: Kennyth Worth HERO, MD Referring: Kennyth Worth HERO, MD Visit Date: 08/09/2024 Occupation: Data Unavailable  Subjective:  Medication management  History of Present Illness: Jeffrey Klein is a 64 y.o. male with psoriatic arthritis, psoriasis, gout and osteoarthritis.  He returns today after his last visit in August 2025.  He denies having a flare of psoriatic arthritis or psoriasis.  He gets occasional psoriasis patches.  He continues to have some stiffness in his hands and hip joints due to underlying osteoarthritis.  He underwent right total hip replacement on May 10, 2024.  He states on January 2 he was visiting his daughter in [place].  He states she slipped off the stairs and fell on his right hip.  He has been having some discomfort in the right hip region and also in his lower back.  He has an appointment coming up with Dr. Vernetta tomorrow.  He has stiffness in his knees and his feet.  He continues to be on Taltz  80 mg subcu every 4 weeks since March 2018 which she has been tolerating well.  He denies any interruption in Taltz .  He denies having a flare of gout.  He has been on allopurinol  100 mg p.o. daily by Dr. Vannie.  Patient states that he was off Taltz  a month prior to the hip replacement surgery and restarted 2 to 3 weeks  after the surgery.  He did not experience any flare of Taltz .    Activities of Daily Living:  Patient reports morning stiffness for all day.  Patient Reports nocturnal pain.  Difficulty dressing/grooming: Denies Difficulty climbing stairs: Reports Difficulty getting out of chair: Reports Difficulty using hands for taps, buttons, cutlery, and/or writing: Denies  Review of Systems  Constitutional:  Positive for fatigue.  HENT:  Negative for mouth sores and mouth dryness.   Eyes:  Negative for dryness.  Respiratory:  Negative for  shortness of breath.   Cardiovascular:  Negative for chest pain and palpitations.  Gastrointestinal:  Negative for blood in stool, constipation and diarrhea.  Endocrine: Negative for increased urination.  Genitourinary:  Negative for involuntary urination.  Musculoskeletal:  Positive for joint pain, gait problem, joint pain, joint swelling and morning stiffness. Negative for myalgias, muscle weakness, muscle tenderness and myalgias.  Skin:  Negative for color change, rash, hair loss and sensitivity to sunlight.  Allergic/Immunologic: Negative for susceptible to infections.  Neurological:  Negative for dizziness and headaches.  Hematological:  Negative for swollen glands.  Psychiatric/Behavioral:  Negative for depressed mood and sleep disturbance. The patient is not nervous/anxious.     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   Gout 11/18/2019   Dyslipidemia 11/18/2019   Low testosterone  11/18/2019   Diverticulosis 11/18/2019   Psoriatic arthritis (HCC) 11/18/2019    Past Medical History:  Diagnosis Date  Diverticulosis    diverticulitis in 2018, no recurrence   GERD (gastroesophageal reflux disease)    High cholesterol    Psoriasis    Psoriatic arthritis (HCC)     Family History  Problem Relation Age of Onset   Psoriasis Sister    Psoriasis Paternal Grandmother    Healthy Daughter    ADD / ADHD Son    Drug abuse Son    Colon cancer Neg Hx    Esophageal cancer Neg Hx    Rectal cancer Neg Hx    Stomach cancer Neg Hx    Past Surgical History:  Procedure  Laterality Date   COLONOSCOPY  08/29/2016   [facility]. Dr. Rolan Bend: Single 5 mm transverse colon adenoma, sigmoid diverticulosis.  Repeat 5 years   dental surgeries     KNEE ARTHROSCOPY Bilateral    LASIK Bilateral 2006   TONSILLECTOMY     as a child    TOOTH EXTRACTION     TOTAL HIP ARTHROPLASTY Right 05/10/2024   Procedure: ARTHROPLASTY, HIP, TOTAL, ANTERIOR APPROACH;  Surgeon: Vernetta Lonni GRADE, MD;  Location: [facility] OR;  Service: Orthopedics;  Laterality: Right;   WRIST SURGERY Right    Social History[1] Social History   Social History Narrative   Not on file     Immunization History  Administered Date(s) Administered   Influenza Inj Mdck Quad Pf 05/16/2022   Influenza, Seasonal, Injecte, Preservative Fre 04/06/2023, 03/08/2024   Influenza,inj,Quad PF,6-35 Mos 05/19/2022   Influenza-Unspecified 02/21/2021, 05/06/2023   PFIZER(Purple Top)SARS-COV-2 Vaccination 10/06/2019, 10/31/2019, 03/05/2020   PNEUMOCOCCAL CONJUGATE-20 08/04/2023   Pneumococcal-Unspecified 08/19/2018   Tdap 11/19/2020   Zoster Recombinant(Shingrix) 04/05/2019, 06/17/2019     Objective: Vital Signs: BP 136/85   Pulse 82   Temp 98.4 F (36.9 C)   Resp 17   Ht 5' 10 (1.778 m)   Wt 221 lb 9.6 oz (100.5 kg)   BMI 31.80 kg/m    Physical Exam Vitals and nursing note reviewed.  Constitutional:      Appearance: He is well-developed.  HENT:     Head: Normocephalic and atraumatic.  Eyes:     Conjunctiva/sclera: Conjunctivae normal.     Pupils: Pupils are equal, round, and reactive to light.  Cardiovascular:     Rate and Rhythm: Normal rate and regular rhythm.     Heart sounds: Normal heart sounds.  Pulmonary:     Effort: Pulmonary effort is normal.     Breath sounds: Normal breath sounds.  Abdominal:     General: Bowel sounds are normal.     Palpations: Abdomen is soft.  Musculoskeletal:     Cervical back: Normal range of motion and neck supple.  Skin:     General: Skin is warm and dry.     Capillary Refill: Capillary refill takes less than 2 seconds.  Neurological:     Mental Status: He is alert and oriented to person, place, and time.  Psychiatric:        Behavior: Behavior normal.      Musculoskeletal Exam: Cervical, thoracic and lumbar spine were in good range of motion.  There was no SI joint tenderness.  Shoulder joints, elbow joints, wrist joints, MCPs, PIPs and DIPs were in good range of motion with no synovitis.  He had bilateral PIP and DIP thickening.  Right hip joint had some limitation with abduction without discomfort.  Left hip joint was in good range of motion.  Knee joints were in good range of motion without  any warmth swelling or effusion.  He had some discomfort range of motion of his left knee joint.  There was no tenderness over ankles or MTPs.  No plantar fasciitis or Achilles tendinitis was noted.  CDAI Exam: CDAI Score: -- Patient Global: --; Provider Global: -- Swollen: --; Tender: -- Joint Exam 08/09/2024   No joint exam has been documented for this visit   There is currently no information documented on the homunculus. Go to the Rheumatology activity and complete the homunculus joint exam.  Investigation: No additional findings.  Imaging: No results found.  Recent Labs: Lab Results  Component Value Date   WBC 8.7 05/11/2024   HGB 13.0 05/11/2024   PLT 288 05/11/2024   NA 135 05/11/2024   K 3.9 05/11/2024   CL 99 05/11/2024   CO2 26 05/11/2024   GLUCOSE 147 (H) 05/11/2024   BUN 9 05/11/2024   CREATININE 1.07 05/11/2024   BILITOT 0.6 03/08/2024   ALKPHOS 41 03/08/2024   AST 27 03/08/2024   ALT 31 03/08/2024   PROT 7.0 03/08/2024   ALBUMIN 4.5 03/08/2024   CALCIUM  8.5 (L) 05/11/2024   GFRAA 107 08/16/2020   QFTBGOLDPLUS NEGATIVE 11/26/2023    Speciality Comments: Inadequate response to Enbrel, Stelara, Cosentyx. He has been on Taltz  for 6 years.  Procedures:  No procedures  performed Allergies: Patient has no known allergies.   Assessment / Plan:     Visit Diagnoses: Psoriatic arthritis (HCC)-patient denies having a flare of psoriatic arthritis.  He was off Taltz  for right total hip replacement done on May 10, 2024.  He states he stopped Taltz  1 month prior to the surgery and just restarted 2 to 3 weeks after the surgery.  No synovitis was noted on the examination today.  Psoriasis - Diagnosed in 1985. Umbilical region.  He denies any active psoriasis lesions.  He has a prescription for betamethasone  valerate 0.1% cream which he applies topically as needed.  High risk medication use - Taltz  80 mg sq injections every 4 weeks-started 09/23/16-initially prescribed by rheum in [place] .  Inadequate response to Enbrel, Stelara, and Cosentyx.  May 11, 2024 CBC was normal.  March 08, 2024 CMP was normal.  Will check labs today.  TB Gold was negative on Nov 26, 2023.  Will get labs today and then in 3 months.  Information regarding the immunization was placed in the AVS.  He was advised to hold Taltz  if he develops an infection resume after the infection resolves.  Chronic right shoulder pain -reports intermittent discomfort in his right shoulder.  He had good range of motion of his right shoulder joint.  Under care of Dr. Leonce.  Fall on 01/08/2023.  X-rays performed on 01/15/2023.  Primary osteoarthritis of both hands-bilateral PIP and DIP thickening was noted.  No synovitis was noted on the examination.  He had surgery on his right wrist in the past.  Status post total replacement of right hip - May 10, 2024 by Dr. Vernetta.  He had good range of motion without discomfort.  Patient reports having a fall on July 08, 2024.  He has been having some discomfort which he describes in the trochanteric region and also lower back.  He has an appointment with Dr. Vernetta tomorrow.  Primary osteoarthritis of left hip -had good range of motion without any  discomfort.  Primary osteoarthritis of both knees -he continues to have pain and discomfort in his knee joints especially his left knee joint.  He plans left total  knee replacement in the future.  X-rays 05/2020: Left knee: severe osteoarthritis, severe chondromalacia patella and the right knee reveals mild osteoarthritis, moderate chondromalacia patella.  Primary osteoarthritis of both feet-bilateral PIP and DIP thickening was noted.  No synovitis was noted.  Idiopathic chronic gout without tophus, unspecified site -he denies having a flare of gout.  He is on allopurinol  100 mg daily prescribed by Dr. Kennyth.  Uric acid 7.8 on March 08, 2024  Elevated hemoglobin A1c - 6.6 on March 08, 2024.  Patient will have repeat hemoglobin A1c at the follow-up visit.  He is also going to weight management.  Hyperlipidemia, mixed-patient reports having an intentional weight loss since he has been going to weight management.  However he gained weight in the last few months.  Weight loss diet and exercise was emphasized.  History of diverticulosis  Selective IgM deficiency (HCC)  Family history of psoriasis in sister  Orders: No orders of the defined types were placed in this encounter.  No orders of the defined types were placed in this encounter.    Follow-Up Instructions: Return in about 5 months (around 01/06/2025) for Psoriatic arthritis.   Maya Nash, MD  Note - This record has been created using Animal nutritionist.  Chart creation errors have been sought, but may not always  have been located. Such creation errors do not reflect on  the standard of medical care.     [1]  Social History Tobacco Use   Smoking status: Never    Passive exposure: Never   Smokeless tobacco: Never  Vaping Use   Vaping status: Never Used  Substance Use Topics   Alcohol use: Yes    Alcohol/week: 5.0 standard drinks of alcohol    Types: 1 Glasses of wine, 4 Cans of beer per week    Comment: occ    Drug use: Never   "

## 2024-08-08 ENCOUNTER — Ambulatory Visit: Admitting: Physician Assistant

## 2024-08-09 ENCOUNTER — Ambulatory Visit: Admitting: Rheumatology

## 2024-08-09 ENCOUNTER — Encounter: Payer: Self-pay | Admitting: Rheumatology

## 2024-08-09 VITALS — BP 136/85 | HR 82 | Temp 98.4°F | Resp 17 | Ht 70.0 in | Wt 221.6 lb

## 2024-08-09 DIAGNOSIS — M1A00X0 Idiopathic chronic gout, unspecified site, without tophus (tophi): Secondary | ICD-10-CM | POA: Diagnosis not present

## 2024-08-09 DIAGNOSIS — R7309 Other abnormal glucose: Secondary | ICD-10-CM | POA: Diagnosis not present

## 2024-08-09 DIAGNOSIS — Z96641 Presence of right artificial hip joint: Secondary | ICD-10-CM

## 2024-08-09 DIAGNOSIS — L4050 Arthropathic psoriasis, unspecified: Secondary | ICD-10-CM

## 2024-08-09 DIAGNOSIS — M19072 Primary osteoarthritis, left ankle and foot: Secondary | ICD-10-CM

## 2024-08-09 DIAGNOSIS — E782 Mixed hyperlipidemia: Secondary | ICD-10-CM

## 2024-08-09 DIAGNOSIS — L409 Psoriasis, unspecified: Secondary | ICD-10-CM

## 2024-08-09 DIAGNOSIS — M1612 Unilateral primary osteoarthritis, left hip: Secondary | ICD-10-CM

## 2024-08-09 DIAGNOSIS — M170 Bilateral primary osteoarthritis of knee: Secondary | ICD-10-CM

## 2024-08-09 DIAGNOSIS — Z79899 Other long term (current) drug therapy: Secondary | ICD-10-CM

## 2024-08-09 DIAGNOSIS — M160 Bilateral primary osteoarthritis of hip: Secondary | ICD-10-CM

## 2024-08-09 DIAGNOSIS — M19071 Primary osteoarthritis, right ankle and foot: Secondary | ICD-10-CM

## 2024-08-09 DIAGNOSIS — G8929 Other chronic pain: Secondary | ICD-10-CM

## 2024-08-09 DIAGNOSIS — M25511 Pain in right shoulder: Secondary | ICD-10-CM

## 2024-08-09 DIAGNOSIS — D804 Selective deficiency of immunoglobulin M [IgM]: Secondary | ICD-10-CM

## 2024-08-09 DIAGNOSIS — M19041 Primary osteoarthritis, right hand: Secondary | ICD-10-CM

## 2024-08-09 DIAGNOSIS — M19042 Primary osteoarthritis, left hand: Secondary | ICD-10-CM

## 2024-08-09 DIAGNOSIS — Z840 Family history of diseases of the skin and subcutaneous tissue: Secondary | ICD-10-CM

## 2024-08-09 DIAGNOSIS — Z8719 Personal history of other diseases of the digestive system: Secondary | ICD-10-CM

## 2024-08-09 NOTE — Patient Instructions (Signed)
 Standing Labs We placed an order today for your standing lab work.   Please have your standing labs drawn in  May and every 3 months  Please have your labs drawn 2 weeks prior to your appointment so that the provider can discuss your lab results at your appointment, if possible.  Please note that you may see your imaging and lab results in MyChart before we have reviewed them. We will contact you once all results are reviewed. Please allow our office up to 72 hours to thoroughly review all of the results before contacting the office for clarification of your results.  WALK-IN LAB HOURS  Monday through Thursday from 8:00 am - 4:00 pm and Friday from 8:00 am-12:00 pm.  Patients with office visits requiring labs will be seen before walk-in labs.  You may encounter longer than normal wait times. Please allow additional time. Wait times may be shorter on  Monday and Thursday afternoons.  We do not book appointments for walk-in labs. We appreciate your patience and understanding with our staff.   Labs are drawn by [facility]. Please bring your co-pay at the time of your lab draw.  You may receive a bill from [facility] for your lab work.  Please note if you are on Hydroxychloroquine  and and an order has been placed for a Hydroxychloroquine  level,  you will need to have it drawn 4 hours or more after your last dose.  If you wish to have your labs drawn at another location, please call the office 24 hours in advance so we can fax the orders.  The office is located at [address], [address], [place], [place] [address]   If you have any questions regarding directions or hours of operation,  please call [phone].   As a reminder, please drink plenty of water  prior to coming for your lab work. Thanks!   Vaccines You are taking a medication(s) that can suppress your immune system.  The following immunizations are recommended: Flu annually Covid-19  RSV Td/Tdap (tetanus, diphtheria, pertussis)  every 10 years Pneumonia (Prevnar 15 then Pneumovax 23 at least 1 year apart.  Alternatively, can take Prevnar 20 without needing additional dose) Shingrix: 2 doses from 4 weeks to 6 months apart  Please check with your PCP to make sure you are up to date.   If you have signs or symptoms of an infection or start antibiotics: First, call your PCP for workup of your infection. Hold your medication through the infection, until you complete your antibiotics, and until symptoms resolve if you take the following: Injectable medication (Actemra, Benlysta, Cimzia, Cosentyx, Enbrel, Humira, Kevzara, Orencia, Remicade, Simponi, Stelara, Taltz, Tremfya) Methotrexate  Leflunomide (Arava) Mycophenolate (Cellcept) Xeljanz, Olumiant, or Rinvoq

## 2024-08-10 ENCOUNTER — Other Ambulatory Visit: Payer: Self-pay

## 2024-08-10 ENCOUNTER — Encounter: Payer: Self-pay | Admitting: Physician Assistant

## 2024-08-10 ENCOUNTER — Ambulatory Visit: Payer: Self-pay | Admitting: Rheumatology

## 2024-08-10 ENCOUNTER — Ambulatory Visit: Admitting: Physician Assistant

## 2024-08-10 DIAGNOSIS — Z96641 Presence of right artificial hip joint: Secondary | ICD-10-CM | POA: Diagnosis not present

## 2024-08-10 LAB — COMPREHENSIVE METABOLIC PANEL WITH GFR
AG Ratio: 2 (calc) (ref 1.0–2.5)
ALT: 25 U/L (ref 9–46)
AST: 22 U/L (ref 10–35)
Albumin: 4.6 g/dL (ref 3.6–5.1)
Alkaline phosphatase (APISO): 40 U/L (ref 35–144)
BUN: 17 mg/dL (ref 7–25)
CO2: 28 mmol/L (ref 20–32)
Calcium: 9.7 mg/dL (ref 8.6–10.3)
Chloride: 105 mmol/L (ref 98–110)
Creat: 1.07 mg/dL (ref 0.70–1.35)
Globulin: 2.3 g/dL (ref 1.9–3.7)
Glucose, Bld: 96 mg/dL (ref 65–99)
Potassium: 4.2 mmol/L (ref 3.5–5.3)
Sodium: 142 mmol/L (ref 135–146)
Total Bilirubin: 0.4 mg/dL (ref 0.2–1.2)
Total Protein: 6.9 g/dL (ref 6.1–8.1)
eGFR: 77 mL/min/{1.73_m2}

## 2024-08-10 LAB — CBC WITH DIFFERENTIAL/PLATELET
Absolute Lymphocytes: 1711 {cells}/uL (ref 850–3900)
Absolute Monocytes: 539 {cells}/uL (ref 200–950)
Basophils Absolute: 28 {cells}/uL (ref 0–200)
Basophils Relative: 0.5 %
Eosinophils Absolute: 132 {cells}/uL (ref 15–500)
Eosinophils Relative: 2.4 %
HCT: 44.3 % (ref 39.4–51.1)
Hemoglobin: 14.8 g/dL (ref 13.2–17.1)
MCH: 29.3 pg (ref 27.0–33.0)
MCHC: 33.4 g/dL (ref 31.6–35.4)
MCV: 87.7 fL (ref 81.4–101.7)
MPV: 9.5 fL (ref 7.5–12.5)
Monocytes Relative: 9.8 %
Neutro Abs: 3091 {cells}/uL (ref 1500–7800)
Neutrophils Relative %: 56.2 %
Platelets: 347 10*3/uL (ref 140–400)
RBC: 5.05 Million/uL (ref 4.20–5.80)
RDW: 13.4 % (ref 11.0–15.0)
Total Lymphocyte: 31.1 %
WBC: 5.5 10*3/uL (ref 3.8–10.8)

## 2024-08-10 NOTE — Progress Notes (Signed)
 HPI: [name] returns today status post fall on around January 4.  He slipped on some ice landed on his right hip.  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  He states he just has some dull achy sensation in the lateral aspect of the hip when walking but this is resolving.  He has been able to continue his normal activities since the fall.  Most the pain he states is just proximal to the incision.  Has been taking ibuprofen for discomfort.  Does bring in a picture of his incision that he states was taken a day ago that shows no wound breakdown.  Review of systems see HPI  Physical exam: General well-developed well-nourished pleasant male in no acute distress ambulates without any assistive device nonantalgic gait. Right hip hip: Excellent range of motion of the hip without pain.  He is able to cross legs.  Right calf supple nontender dorsiflexion plantarflexion right ankle intact.  Radiographs: AP pelvis and lateral view right hip shows no acute fractures no acute findings.  Right hip is well-seated.  No hardware failure.  No sclerotic changes throughout the pelvis.  Impression: Status post right total hip arthroplasty Right hip pain  Plan: He will follow-up with us  at his 75-month scheduled appointment for his right total hip.  Questions were encouraged and answered at length.  Can follow-up sooner if there is any concerns.

## 2024-12-19 ENCOUNTER — Ambulatory Visit: Admitting: Orthopaedic Surgery

## 2025-01-10 ENCOUNTER — Ambulatory Visit: Admitting: Physician Assistant

## 2025-03-14 ENCOUNTER — Encounter: Admitting: Family Medicine
